# Patient Record
Sex: Female | Born: 1945 | Race: Black or African American | Hispanic: No | Smoking: Never smoker
Health system: Southern US, Community
[De-identification: ages and names within clinical notes are randomized; demographics above are authoritative.]

## PROBLEM LIST (undated history)

## (undated) DIAGNOSIS — N183 Chronic kidney disease, stage 3 unspecified: Secondary | ICD-10-CM

## (undated) DIAGNOSIS — Z9989 Dependence on other enabling machines and devices: Secondary | ICD-10-CM

## (undated) DIAGNOSIS — Z9289 Personal history of other medical treatment: Secondary | ICD-10-CM

## (undated) DIAGNOSIS — Z8719 Personal history of other diseases of the digestive system: Secondary | ICD-10-CM

## (undated) DIAGNOSIS — D638 Anemia in other chronic diseases classified elsewhere: Secondary | ICD-10-CM

## (undated) DIAGNOSIS — F419 Anxiety disorder, unspecified: Secondary | ICD-10-CM

## (undated) DIAGNOSIS — M109 Gout, unspecified: Secondary | ICD-10-CM

## (undated) DIAGNOSIS — R011 Cardiac murmur, unspecified: Secondary | ICD-10-CM

## (undated) DIAGNOSIS — I82409 Acute embolism and thrombosis of unspecified deep veins of unspecified lower extremity: Secondary | ICD-10-CM

## (undated) DIAGNOSIS — G4733 Obstructive sleep apnea (adult) (pediatric): Secondary | ICD-10-CM

## (undated) DIAGNOSIS — E119 Type 2 diabetes mellitus without complications: Secondary | ICD-10-CM

## (undated) DIAGNOSIS — M1990 Unspecified osteoarthritis, unspecified site: Secondary | ICD-10-CM

## (undated) DIAGNOSIS — I4891 Unspecified atrial fibrillation: Secondary | ICD-10-CM

## (undated) DIAGNOSIS — E0590 Thyrotoxicosis, unspecified without thyrotoxic crisis or storm: Secondary | ICD-10-CM

## (undated) DIAGNOSIS — H409 Unspecified glaucoma: Secondary | ICD-10-CM

## (undated) DIAGNOSIS — I10 Essential (primary) hypertension: Secondary | ICD-10-CM

## (undated) DIAGNOSIS — E785 Hyperlipidemia, unspecified: Secondary | ICD-10-CM

## (undated) HISTORY — PX: TRACHEOSTOMY CLOSURE: SHX458

## (undated) HISTORY — PX: CARDIAC CATHETERIZATION: SHX172

## (undated) HISTORY — PX: APPENDECTOMY: SHX54

## (undated) HISTORY — PX: CARPAL TUNNEL RELEASE: SHX101

## (undated) HISTORY — PX: VAGINAL HYSTERECTOMY: SUR661

## (undated) HISTORY — PX: CHOLECYSTECTOMY: SHX55

---

## 2013-09-11 HISTORY — PX: EXPLORATORY LAPAROTOMY: SUR591

## 2013-10-12 HISTORY — PX: GASTROSTOMY TUBE PLACEMENT: SHX655

## 2013-10-12 HISTORY — PX: TRACHEOSTOMY: SUR1362

## 2013-10-25 ENCOUNTER — Inpatient Hospital Stay
Admission: AD | Admit: 2013-10-25 | Discharge: 2013-11-22 | Disposition: A | Discharge status: Rehabilitation Facility | Payer: Medicare Other | Source: Ambulatory Visit | Attending: Internal Medicine | Admitting: Internal Medicine

## 2013-10-25 DIAGNOSIS — J9620 Acute and chronic respiratory failure, unspecified whether with hypoxia or hypercapnia: Secondary | ICD-10-CM | POA: Diagnosis present

## 2013-10-25 DIAGNOSIS — E119 Type 2 diabetes mellitus without complications: Secondary | ICD-10-CM

## 2013-10-25 DIAGNOSIS — N189 Chronic kidney disease, unspecified: Secondary | ICD-10-CM

## 2013-10-25 DIAGNOSIS — G928 Other toxic encephalopathy: Secondary | ICD-10-CM

## 2013-10-25 DIAGNOSIS — E8770 Fluid overload, unspecified: Secondary | ICD-10-CM | POA: Diagnosis present

## 2013-10-25 DIAGNOSIS — Z9911 Dependence on respirator [ventilator] status: Secondary | ICD-10-CM

## 2013-10-25 DIAGNOSIS — N289 Disorder of kidney and ureter, unspecified: Secondary | ICD-10-CM

## 2013-10-25 DIAGNOSIS — Z930 Tracheostomy status: Secondary | ICD-10-CM

## 2013-10-25 DIAGNOSIS — G92 Toxic encephalopathy: Secondary | ICD-10-CM

## 2013-10-25 HISTORY — DX: Thyrotoxicosis, unspecified without thyrotoxic crisis or storm: E05.90

## 2013-10-25 HISTORY — DX: Chronic kidney disease, stage 3 unspecified: N18.30

## 2013-10-25 HISTORY — DX: Morbid (severe) obesity due to excess calories: E66.01

## 2013-10-25 HISTORY — DX: Anemia in other chronic diseases classified elsewhere: D63.8

## 2013-10-25 HISTORY — DX: Essential (primary) hypertension: I10

## 2013-10-25 HISTORY — DX: Acute embolism and thrombosis of unspecified deep veins of unspecified lower extremity: I82.409

## 2013-10-25 HISTORY — DX: Chronic kidney disease, stage 3 (moderate): N18.3

## 2013-10-25 HISTORY — DX: Type 2 diabetes mellitus without complications: E11.9

## 2013-10-25 HISTORY — DX: Hyperlipidemia, unspecified: E78.5

## 2013-10-25 LAB — BLOOD GAS, ARTERIAL
Acid-base deficit: 4.3 mmol/L — ABNORMAL HIGH (ref 0.0–2.0)
BICARBONATE: 19 meq/L — AB (ref 20.0–24.0)
FIO2: 0.4 %
MECHVT: 650 mL
O2 SAT: 96.1 %
PEEP/CPAP: 5 cmH2O
PO2 ART: 80.7 mmHg (ref 80.0–100.0)
Patient temperature: 98.6
RATE: 16 resp/min
TCO2: 19.9 mmol/L (ref 0–100)
pCO2 arterial: 27.7 mmHg — ABNORMAL LOW (ref 35.0–45.0)
pH, Arterial: 7.451 — ABNORMAL HIGH (ref 7.350–7.450)

## 2013-10-26 ENCOUNTER — Other Ambulatory Visit (HOSPITAL_COMMUNITY): Payer: Medicare Other

## 2013-10-26 ENCOUNTER — Encounter: Payer: Self-pay | Admitting: Adult Health

## 2013-10-26 DIAGNOSIS — E8770 Fluid overload, unspecified: Secondary | ICD-10-CM | POA: Diagnosis present

## 2013-10-26 DIAGNOSIS — Z9911 Dependence on respirator [ventilator] status: Secondary | ICD-10-CM

## 2013-10-26 DIAGNOSIS — G929 Unspecified toxic encephalopathy: Secondary | ICD-10-CM

## 2013-10-26 DIAGNOSIS — G928 Other toxic encephalopathy: Secondary | ICD-10-CM | POA: Diagnosis present

## 2013-10-26 DIAGNOSIS — G92 Toxic encephalopathy: Secondary | ICD-10-CM | POA: Diagnosis present

## 2013-10-26 DIAGNOSIS — N289 Disorder of kidney and ureter, unspecified: Secondary | ICD-10-CM

## 2013-10-26 DIAGNOSIS — J9620 Acute and chronic respiratory failure, unspecified whether with hypoxia or hypercapnia: Secondary | ICD-10-CM

## 2013-10-26 DIAGNOSIS — Z930 Tracheostomy status: Secondary | ICD-10-CM

## 2013-10-26 DIAGNOSIS — E8779 Other fluid overload: Secondary | ICD-10-CM

## 2013-10-26 DIAGNOSIS — N189 Chronic kidney disease, unspecified: Secondary | ICD-10-CM | POA: Diagnosis present

## 2013-10-26 DIAGNOSIS — E119 Type 2 diabetes mellitus without complications: Secondary | ICD-10-CM | POA: Diagnosis present

## 2013-10-26 LAB — COMPREHENSIVE METABOLIC PANEL
ALK PHOS: 85 U/L (ref 39–117)
ALT: 21 U/L (ref 0–35)
AST: 19 U/L (ref 0–37)
Albumin: 1.4 g/dL — ABNORMAL LOW (ref 3.5–5.2)
BUN: 54 mg/dL — AB (ref 6–23)
CALCIUM: 8.1 mg/dL — AB (ref 8.4–10.5)
CO2: 18 mEq/L — ABNORMAL LOW (ref 19–32)
Chloride: 104 mEq/L (ref 96–112)
Creatinine, Ser: 1.97 mg/dL — ABNORMAL HIGH (ref 0.50–1.10)
GFR calc non Af Amer: 25 mL/min — ABNORMAL LOW (ref >90)
GFR, EST AFRICAN AMERICAN: 30 mL/min — AB (ref >90)
Glucose, Bld: 178 mg/dL — ABNORMAL HIGH (ref 70–99)
Potassium: 4 mEq/L (ref 3.7–5.3)
Sodium: 142 mEq/L (ref 137–147)
TOTAL PROTEIN: 5.6 g/dL — AB (ref 6.0–8.3)
Total Bilirubin: 0.4 mg/dL (ref 0.3–1.2)

## 2013-10-26 LAB — CBC
HEMATOCRIT: 29.2 % — AB (ref 36.0–46.0)
Hemoglobin: 9.6 g/dL — ABNORMAL LOW (ref 12.0–15.0)
MCH: 28.3 pg (ref 26.0–34.0)
MCHC: 32.9 g/dL (ref 30.0–36.0)
MCV: 86.1 fL (ref 78.0–100.0)
Platelets: 150 10*3/uL (ref 150–400)
RBC: 3.39 MIL/uL — ABNORMAL LOW (ref 3.87–5.11)
RDW: 17.4 % — ABNORMAL HIGH (ref 11.5–15.5)
WBC: 4.4 10*3/uL (ref 4.0–10.5)

## 2013-10-26 LAB — BLOOD GAS, ARTERIAL
ACID-BASE DEFICIT: 4.9 mmol/L — AB (ref 0.0–2.0)
BICARBONATE: 18.1 meq/L — AB (ref 20.0–24.0)
FIO2: 0.4 %
LHR: 12 {breaths}/min
MECHVT: 650 mL
O2 SAT: 98 %
PEEP: 5 cmH2O
PO2 ART: 94.2 mmHg (ref 80.0–100.0)
Patient temperature: 98.6
TCO2: 18.8 mmol/L (ref 0–100)
pCO2 arterial: 24.9 mmHg — ABNORMAL LOW (ref 35.0–45.0)
pH, Arterial: 7.473 — ABNORMAL HIGH (ref 7.350–7.450)

## 2013-10-26 LAB — PREALBUMIN: Prealbumin: 16.8 mg/dL — ABNORMAL LOW (ref 17.0–34.0)

## 2013-10-26 LAB — PROCALCITONIN: Procalcitonin: 1.03 ng/mL

## 2013-10-26 LAB — TSH: TSH: 2.23 u[IU]/mL (ref 0.350–4.500)

## 2013-10-26 NOTE — Consult Note (Signed)
Name: Adeena Apgar MRN: [id number] DOB: 1946-01-31    ADMISSION DATE:  10/25/2013 CONSULTATION DATE:  10/26/13  REFERRING MD :  Sharyon Medicus  PRIMARY SERVICE:  [facility]   CHIEF COMPLAINT:  Vent mgmt   BRIEF PATIENT DESCRIPTION: 67yo morbidly obese female with hx PAF, HTN, hx DVT on coumadin, initially admitted 3/18 to [facility] following complications of attempted ileum polyp removal.  Ultimately developed sigmoid colon perforation s/p exp lap 3/18 where she failed extubation post op.  Course was c/b acute on chronic renal failure, Afib with RVR, ?angioedema r/t Invanz, fungemia, dehiscence of her abd wound and VDRF now s/p trach.  Tx to [facility] 4/14 for further vent weaning, wound care and rehab efforts.   SIGNIFICANT EVENTS / STUDIES:    LINES / TUBES: Trach (osh) 4/1>>> R PICC (osh) 4/10>>> PEG (osh) 4/7>>>  CULTURES:   ANTIBIOTICS: Aztreonam >>> Micafungin >>>  HISTORY OF PRESENT ILLNESS:  67yo morbidly obese female with hx PAF, HTN, hx DVT on coumadin, initially admitted 3/18 to [facility] following complications of attempted ileum polyp removal.  Ultimately developed sigmoid colon perforation s/p exp lap 3/18 where she failed extubation post op.  Course was c/b acute on chronic renal failure, Afib with RVR, ?angioedema r/t Invanz, fungemia, dehiscence of her abd wound and VDRF now s/p trach.  Tx to [facility] 4/14 for further vent weaning, wound care and rehab efforts.   PAST MEDICAL HISTORY :  Past Medical History  Diagnosis Date  . PAF (paroxysmal atrial fibrillation)   . Anemia of chronic disease   . HTN (hypertension)   . Morbid obesity   . Diabetes mellitus, type II   . Hyperthyroidism   . Hyperlipidemia   . CKD (chronic kidney disease), stage III    No past surgical history on file. Prior to Admission medications   Not on File   Allergies not on file  FAMILY HISTORY:  No family history on file. SOCIAL HISTORY:  has no tobacco, alcohol, and  drug history on file.  REVIEW OF SYSTEMS:   Pt sedated on vent.  As per HPI obtained from records.    VITAL SIGNS:  Reviewed at bedside.   PHYSICAL EXAMINATION: General:  Morbidly obese female, NAD on vent  Neuro:  Awake, attempts to interact, RASS 0, MAE HEENT:  Trach #8, midline, c/d Cardiovascular:  s1s2 distant Lungs:  resps even non labored on full vent support, clear anteriorly, diminished bases  Abdomen:  Obese, PEG c/d, mid-L abd dehiscence with dsg c/d  Musculoskeletal:  Warm and dry, 1-2+ BLE edema   Recent Labs Lab 10/26/13 0500  NA 142  K 4.0  CL 104  CO2 18*  BUN 54*  CREATININE 1.97*  GLUCOSE 178*    Recent Labs Lab 10/26/13 0500  HGB 9.6*  HCT 29.2*  WBC 4.4  PLT 150   Dg Chest Port 1 View  10/26/2013   CLINICAL DATA:  Respiratory failure  EXAM: PORTABLE CHEST - 1 VIEW  COMPARISON:  Portable exam 0701 hr without priors for comparison  FINDINGS: Tracheostomy tube projects over tracheal air column the tip 4.7 cm above carinal.  Tip of right arm PICC line projects over SVC.  Normal heart size and mediastinal contours.  Scattered mild bilateral pulmonary infiltrates.  No pleural effusion or pneumothorax.  IMPRESSION: Scattered bilateral pulmonary infiltrates.   Electronically Signed   By: Ulyses Southward M.D.   On: 10/26/2013 07:54    ASSESSMENT / PLAN:  Prolonged VDRF after  complicated critical illness- s/p exp lap c/b abd wound dehiscence in setting perforated sigmoid colon.   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 I have formulated the assessment and plan as reflected in the note above with amendments made by me.   Discussed with Dr Duwayne HeckHijazi  David Simonds, MD;  PCCM service; Mobile [phone]

## 2013-10-27 DIAGNOSIS — Z9911 Dependence on respirator [ventilator] status: Secondary | ICD-10-CM

## 2013-10-27 NOTE — Progress Notes (Signed)
Name: Sarah Tran MRN: [id number] DOB: 25-Nov-1945    ADMISSION DATE:  10/25/2013 CONSULTATION DATE:  10/26/13  REFERRING MD :  Sharyon Medicus  PRIMARY SERVICE:  [facility]   CHIEF COMPLAINT:  Vent mgmt   BRIEF PATIENT DESCRIPTION: 68yo morbidly obese female with hx PAF, HTN, hx DVT on coumadin, initially admitted 3/18 to [facility] following complications of attempted ileum polyp removal.  Ultimately developed sigmoid colon perforation s/p exp lap 3/18 where she failed extubation post op.  Course was c/b acute on chronic renal failure, Afib with RVR, ?angioedema r/t Invanz, fungemia, dehiscence of her abd wound and VDRF now s/p trach.  Tx to [facility] 4/14 for further vent weaning, wound care and rehab efforts.   SIGNIFICANT EVENTS / STUDIES:  4/15 started on [facility]  protocol  LINES / TUBES: Trach (osh) 4/1>>> R PICC (osh) 4/10>>> PEG (osh) 4/7>>>  CULTURES:   ANTIBIOTICS: Per [facility]  HISTORY OF PRESENT ILLNESS:  68yo morbidly obese female with hx PAF, HTN, hx DVT on coumadin, initially admitted 3/18 to [facility] following complications of attempted ileum polyp removal.  Ultimately developed sigmoid colon perforation s/p exp lap 3/18 where she failed extubation post op.  Course was c/b acute on chronic renal failure, Afib with RVR, ?angioedema r/t Invanz, fungemia, dehiscence of her abd wound and VDRF now s/p trach.  Tx to [facility] 4/14 for further vent weaning, wound care and rehab efforts.   VITAL SIGNS: Vital signs reviewed. Abnormal values will appear under impression plan section.    PHYSICAL EXAMINATION: General:  Morbidly obese female, NAD on vent  Neuro:  Awake, follows commands now that diprivan is off HEENT:  Trach #8, midline, c/d Cardiovascular:  s1s2 distant Lungs:  resps even non labored on full vent support, clear anteriorly, diminished bases  Abdomen:  Obese, PEG c/d, mid-L abd dehiscence with dsg c/d  Musculoskeletal:  Warm and dry, 1-2+ BLE  edema   Recent Labs Lab 10/26/13 0500  NA 142  K 4.0  CL 104  CO2 18*  BUN 54*  CREATININE 1.97*  GLUCOSE 178*    Recent Labs Lab 10/26/13 0500  HGB 9.6*  HCT 29.2*  WBC 4.4  PLT 150   Dg Chest Port 1 View  10/26/2013   CLINICAL DATA:  Respiratory failure  EXAM: PORTABLE CHEST - 1 VIEW  COMPARISON:  Portable exam 0701 hr without priors for comparison  FINDINGS: Tracheostomy tube projects over tracheal air column the tip 4.7 cm above carinal.  Tip of right arm PICC line projects over SVC.  Normal heart size and mediastinal contours.  Scattered mild bilateral pulmonary infiltrates.  No pleural effusion or pneumothorax.  IMPRESSION: Scattered bilateral pulmonary infiltrates.   Electronically Signed   By: Ulyses Southward M.D.   On: 10/26/2013 07:54    ASSESSMENT / PLAN:  Prolonged VDRF after complicated critical illness- s/p exp lap c/b abd wound dehiscence in setting perforated sigmoid colon.   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  After 5:30 PM or weekends, call [phone]

## 2013-10-27 NOTE — Progress Notes (Signed)
[facility]                                                                                              Progress note     Patient Demographics  Sarah Tran, is a 68 y.o. female  JKQ:[id number]  [id number]  DOB - 1945/09/19  Admit date - 10/25/2013  Admitting Physician Carron Curie, MD  Outpatient Primary MD for the patient is No PCP Per Patient  LOS - 2  Chief complaint   Respiratory failure      Wound   Bacteremia      Subjective:   Sarah Tran is obtunded and can't give any history  Objective:   Vital signs  Temperature 98.3 Heart rate 94 Respiratory rate 16 Blood pressure 119/63 Pulse ox 96%    Exam Obtunded cannot give any history Right gaze preference, pupils equal reactive to light Supple Neck,No JVD, No cervical lymphadenopathy appriciated.  Symmetrical Chest wall movement, decreased breath sounds bilaterally with scattered rhonchi Irregular,No Gallops,Rubs or new Murmurs, No Parasternal Heave +ve B.Sounds, Abd Soft, abdomen wound noted with Dehiscence, ostomy noted ,  No Cyanosis, Clubbing or edema, No new Rash or bruise     I&Os 2446/1300 PEG tube noted Foley - yes Trach yes  Data Review   CBC  Recent Labs Lab 10/26/13 0500  WBC 4.4  HGB 9.6*  HCT 29.2*  PLT 150  MCV 86.1  MCH 28.3  MCHC 32.9  RDW 17.4*    Chemistries   Recent Labs Lab 10/26/13 0500  NA 142  K 4.0  CL 104  CO2 18*  GLUCOSE 178*  BUN 54*  CREATININE 1.97*  CALCIUM 8.1*  AST 19  ALT 21  ALKPHOS 85  BILITOT 0.4    Recent Labs  10/26/13 0500  TSH 2.230   ------------------------------------------------------------------------------------------------------------------ No results found for this basename: VITAMINB12, FOLATE, FERRITIN, TIBC, IRON, RETICCTPCT,  in the last 72 hours  Coagulation profile No results found for this basename: INR, PROTIME,  in the last  168 hours  No results found for this basename: DDIMER,  in the last 72 hours  Micro Results No results found for this or any previous visit (from the past 240 hour(s)).     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 Candidemia likely line sepsis related on Diflucan Paroxysmal atrial fib Escherichia coli UTI Hypertension controlled Protein calorie malnutrition on Vital 1.5 Encephalopathy/agitation of propofol on when necessary fentanyl and Klonopin scheduled Diabetes mellitus type 2 Generalized weakness continue with PT OT Sigmoid colon perforation status post diverting colostomy Bacteremia with gram-positive cocci in clusters; started on vancomycin yesterday Plan Check CBC BMP portable chest x-ray in a.m. Check repeat blood cultures Check echo  Critical-care time 38 minutes  Code Status: Full  Family Communication: Discussed with family at bedside  DVT Prophylaxis heparin   Carron Curie M.D on 10/27/2013 at 2:20 PM

## 2013-10-28 ENCOUNTER — Other Ambulatory Visit (HOSPITAL_COMMUNITY): Payer: Self-pay

## 2013-10-28 ENCOUNTER — Other Ambulatory Visit (HOSPITAL_COMMUNITY): Payer: Medicare Other

## 2013-10-28 DIAGNOSIS — I517 Cardiomegaly: Secondary | ICD-10-CM

## 2013-10-28 LAB — BASIC METABOLIC PANEL
BUN: 60 mg/dL — AB (ref 6–23)
CHLORIDE: 105 meq/L (ref 96–112)
CO2: 20 mEq/L (ref 19–32)
Calcium: 8.2 mg/dL — ABNORMAL LOW (ref 8.4–10.5)
Creatinine, Ser: 2.25 mg/dL — ABNORMAL HIGH (ref 0.50–1.10)
GFR calc non Af Amer: 22 mL/min — ABNORMAL LOW (ref >90)
GFR, EST AFRICAN AMERICAN: 25 mL/min — AB (ref >90)
Glucose, Bld: 213 mg/dL — ABNORMAL HIGH (ref 70–99)
POTASSIUM: 4 meq/L (ref 3.7–5.3)
SODIUM: 140 meq/L (ref 137–147)

## 2013-10-28 LAB — CBC
HEMATOCRIT: 27.3 % — AB (ref 36.0–46.0)
HEMOGLOBIN: 9 g/dL — AB (ref 12.0–15.0)
MCH: 29.2 pg (ref 26.0–34.0)
MCHC: 33 g/dL (ref 30.0–36.0)
MCV: 88.6 fL (ref 78.0–100.0)
Platelets: 182 10*3/uL (ref 150–400)
RBC: 3.08 MIL/uL — AB (ref 3.87–5.11)
RDW: 16.2 % — ABNORMAL HIGH (ref 11.5–15.5)
WBC: 4.7 10*3/uL (ref 4.0–10.5)

## 2013-10-28 NOTE — Progress Notes (Signed)
[facility]                                                                                              Progress note     Patient Demographics  Sarah Tran, is a 68 y.o. female  NAT:[id number]  [id number]:[id number]  DOB - 1945-12-29  Admit date - 10/25/2013  Admitting Physician Carron Curie, MD  Outpatient Primary MD for the patient is No PCP Per Patient  LOS - 3  Chief complaint   Respiratory failure      Wound   Bacteremia      Subjective:   Sarah Tran is obtunded and can't give any history  Objective:   Vital signs  Temperature 99.1 Heart rate 103 Respiratory rate 20 Blood pressure 149/85 Pulse ox 99%    Exam Obtunded cannot give any history Right gaze preference, pupils equal reactive to light Supple Neck,No JVD, No cervical lymphadenopathy appriciated.  Symmetrical Chest wall movement, decreased breath sounds bilaterally with scattered rhonchi Irregular,No Gallops,Rubs or new Murmurs, No Parasternal Heave +ve B.Sounds, Abd Soft, abdomen wound noted with Dehiscence, ostomy noted ,  No Cyanosis, Clubbing or edema, No new Rash or bruise     I&Os 2100/2600 PEG tube noted Foley - yes Trach yes  Data Review   CBC  Recent Labs Lab 10/26/13 0500 10/28/13 0500  WBC 4.4 4.7  HGB 9.6* 9.0*  HCT 29.2* 27.3*  PLT 150 182  MCV 86.1 88.6  MCH 28.3 29.2  MCHC 32.9 33.0  RDW 17.4* 16.2*    Chemistries   Recent Labs Lab 10/26/13 0500 10/28/13 0500  NA 142 140  K 4.0 4.0  CL 104 105  CO2 18* 20  GLUCOSE 178* 213*  BUN 54* 60*  CREATININE 1.97* 2.25*  CALCIUM 8.1* 8.2*  AST 19  --   ALT 21  --   ALKPHOS 85  --   BILITOT 0.4  --     Recent Labs  10/26/13 0500  TSH 2.230   ------------------------------------------------------------------------------------------------------------------ No results found for this basename: VITAMINB12, FOLATE, FERRITIN, TIBC,  IRON, RETICCTPCT,  in the last 72 hours  Coagulation profile No results found for this basename: INR, PROTIME,  in the last 168 hours  No results found for this basename: DDIMER,  in the last 72 hours  Micro Results No results found for this or any previous visit (from the past 240 hour(s)).     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 Candidemia likely line sepsis related on Diflucan Paroxysmal atrial fib Escherichia coli UTI Hypertension controlled Protein calorie malnutrition on Vital 1.5 Encephalopathy/agitation of propofol on when necessary fentanyl and Klonopin scheduled Diabetes mellitus type 2 Generalized weakness continue with PT OT Sigmoid colon perforation status post diverting colostomy Bacteremia with gram-positive cocci in clusters; started on vancomycin yesterday Plan Check BMP in a.m. Check repeat blood cultures Check echo If creatinine continues: Up with change vancomycin to Zyvox IV fluids normal saline Check urinalysis for hematuria  Critical-care time 34 minutes  Code Status: Full  Family Communication: Discussed with family at bedside  DVT Prophylaxis heparin   Carron CurieAli Londyn Wotton M.D on 10/28/2013 at 3:57 PM

## 2013-10-28 NOTE — Progress Notes (Signed)
  Echocardiogram 2D Echocardiogram has been performed.  Sarah Tran 10/28/2013, 12:52 PM

## 2013-10-29 LAB — BASIC METABOLIC PANEL
BUN: 61 mg/dL — ABNORMAL HIGH (ref 6–23)
CHLORIDE: 105 meq/L (ref 96–112)
CO2: 23 meq/L (ref 19–32)
CREATININE: 2.04 mg/dL — AB (ref 0.50–1.10)
Calcium: 8.3 mg/dL — ABNORMAL LOW (ref 8.4–10.5)
GFR calc non Af Amer: 24 mL/min — ABNORMAL LOW (ref >90)
GFR, EST AFRICAN AMERICAN: 28 mL/min — AB (ref >90)
Glucose, Bld: 203 mg/dL — ABNORMAL HIGH (ref 70–99)
POTASSIUM: 3.9 meq/L (ref 3.7–5.3)
SODIUM: 140 meq/L (ref 137–147)

## 2013-10-29 NOTE — Progress Notes (Signed)
[facility]                                                                                              Progress note     Patient Demographics  Sarah Tran, is a 68 y.o. female  WGN:[id number]  HQI:[id number]  DOB - 1945/10/11  Admit date - 10/25/2013  Admitting Physician Carron CurieAli Rebeccah Ivins, MD  Outpatient Primary MD for the patient is No PCP Per Patient  LOS - 4  Chief complaint   Respiratory failure      Wound   Bacteremia      Subjective:   Sarah CabalNancy Dorsi mode alert and awake complains only of constipation. No chest pains shortness of breath no nausea or vomiting  Objective:   Vital signs  Temperature 96.8 Heart rate 71 Respiratory rate 15 Blood pressure 140/80 5 Pulse ox 98%    Exam Obtunded cannot give any history Right gaze preference, pupils equal reactive to light Supple Neck,No JVD, No cervical lymphadenopathy appriciated.  Symmetrical Chest wall movement, decreased breath sounds bilaterally with scattered rhonchi Irregular,No Gallops,Rubs or new Murmurs, No Parasternal Heave +ve B.Sounds, Abd Soft, abdomen wound noted with Dehiscence, ostomy noted ,  No Cyanosis, Clubbing or edema, No new Rash or bruise     I&Os unknown PEG tube noted Foley - yes Trach yes  Data Review   CBC  Recent Labs Lab 10/26/13 0500 10/28/13 0500  WBC 4.4 4.7  HGB 9.6* 9.0*  HCT 29.2* 27.3*  PLT 150 182  MCV 86.1 88.6  MCH 28.3 29.2  MCHC 32.9 33.0  RDW 17.4* 16.2*    Chemistries   Recent Labs Lab 10/26/13 0500 10/28/13 0500 10/29/13 0500  NA 142 140 140  K 4.0 4.0 3.9  CL 104 105 105  CO2 18* 20 23  GLUCOSE 178* 213* 203*  BUN 54* 60* 61*  CREATININE 1.97* 2.25* 2.04*  CALCIUM 8.1* 8.2* 8.3*  AST 19  --   --   ALT 21  --   --   ALKPHOS 85  --   --   BILITOT 0.4  --   --    No results found for this basename: TSH, T4TOTAL, FREET3, T3FREE, THYROIDAB,  in the last 72  hours ------------------------------------------------------------------------------------------------------------------ No results found for this basename: VITAMINB12, FOLATE, FERRITIN, TIBC, IRON, RETICCTPCT,  in the last 72 hours  Coagulation profile No results found for this basename: INR, PROTIME,  in the last 168 hours  No results found for this basename: DDIMER,  in the last 72 hours  Micro Results No results found for this or any previous visit (from the past 240 hour(s)).     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 Candidemia likely line sepsis related on Diflucan Paroxysmal atrial fib Escherichia coli UTI Hypertension controlled Protein calorie malnutrition on Vital 1.5 Encephalopathy/agitation of propofol on when necessary fentanyl and Klonopin scheduled Diabetes mellitus type 2 Generalized weakness continue with PT OT Sigmoid colon perforation status post  diverting colostomy Bacteremia with gram-positive cocci in clusters; started on vancomycin yesterday Constipation  Plan  Start docusate and lactulose   Code Status: Full  Family Communication: Discussed with family at bedside  DVT Prophylaxis heparin   Carron Curie M.D on 10/29/2013 at 12:51 PM

## 2013-10-30 LAB — VANCOMYCIN, TROUGH: Vancomycin Tr: 21.7 ug/mL — ABNORMAL HIGH (ref 10.0–20.0)

## 2013-10-30 NOTE — Progress Notes (Signed)
[facility]                                                                                              Progress note     Patient Demographics  Sarah Tran, is a 68 y.o. female  [id number]:[id number]  [id number]:[id number]  DOB - [birth year]  Admit date - 10/25/2013  Admitting Physician Carron Curie, MD  Outpatient Primary MD for the patient is No PCP Per Patient  LOS - 5  Chief complaint   Respiratory failure      Wound   Bacteremia      Subjective:   Sarah Tran denies any chest pain shortness of breath nausea or vomiting. Her diarrhea has resolved. Objective:   Vital signs  Temperature 98.4 Heart rate 82 Respiratory rate 18 Blood pressure 156/76 Pulse ox 97%    Exam Obtunded cannot give any history Right gaze preference, pupils equal reactive to light Supple Neck,No JVD, No cervical lymphadenopathy appriciated.  Symmetrical Chest wall movement, decreased breath sounds bilaterally with scattered rhonchi Irregular,No Gallops,Rubs or new Murmurs, No Parasternal Heave +ve B.Sounds, Abd Soft, abdomen wound noted with Dehiscence, ostomy noted ,  No Cyanosis, Clubbing or edema, No new Rash or bruise     I&Os 2573/2850 PEG tube noted Foley - yes Trach yes  Data Review   CBC  Recent Labs Lab 10/26/13 0500 10/28/13 0500  WBC 4.4 4.7  HGB 9.6* 9.0*  HCT 29.2* 27.3*  PLT 150 182  MCV 86.1 88.6  MCH 28.3 29.2  MCHC 32.9 33.0  RDW 17.4* 16.2*    Chemistries   Recent Labs Lab 10/26/13 0500 10/28/13 0500 10/29/13 0500  NA 142 140 140  K 4.0 4.0 3.9  CL 104 105 105  CO2 18* 20 23  GLUCOSE 178* 213* 203*  BUN 54* 60* 61*  CREATININE 1.97* 2.25* 2.04*  CALCIUM 8.1* 8.2* 8.3*  AST 19  --   --   ALT 21  --   --   ALKPHOS 85  --   --   BILITOT 0.4  --   --    No results found for this basename: TSH, T4TOTAL, FREET3, T3FREE, THYROIDAB,  in the last 72  hours ------------------------------------------------------------------------------------------------------------------ No results found for this basename: VITAMINB12, FOLATE, FERRITIN, TIBC, IRON, RETICCTPCT,  in the last 72 hours  Coagulation profile No results found for this basename: INR, PROTIME,  in the last 168 hours  No results found for this basename: DDIMER,  in the last 72 hours  Micro Results No results found for this or any previous visit (from the past 240 hour(s)).     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 Candidemia likely line sepsis related on Diflucan Paroxysmal atrial fib Escherichia coli UTI Hypertension controlled Protein calorie malnutrition on Vital 1.5 Encephalopathy/agitation of propofol on when necessary fentanyl and Klonopin scheduled Diabetes mellitus type 2 Generalized weakness continue with PT OT Sigmoid colon perforation status post diverting colostomy Bacteremia with gram-positive cocci  in clusters; started on vancomycin yesterday Constipation  Plan Check labs and portable chest x-ray in am    Code Status: Full  Family Communication: Discussed with family at bedside  DVT Prophylaxis heparin   Carron CurieAli Malai Lady M.D on 10/30/2013 at 12:49 PM

## 2013-10-31 ENCOUNTER — Other Ambulatory Visit (HOSPITAL_COMMUNITY): Payer: Medicare Other

## 2013-10-31 LAB — CBC
HEMATOCRIT: 27 % — AB (ref 36.0–46.0)
HEMOGLOBIN: 8.9 g/dL — AB (ref 12.0–15.0)
MCH: 28.9 pg (ref 26.0–34.0)
MCHC: 33 g/dL (ref 30.0–36.0)
MCV: 87.7 fL (ref 78.0–100.0)
Platelets: 176 10*3/uL (ref 150–400)
RBC: 3.08 MIL/uL — AB (ref 3.87–5.11)
RDW: 15.1 % (ref 11.5–15.5)
WBC: 5.9 10*3/uL (ref 4.0–10.5)

## 2013-10-31 LAB — BASIC METABOLIC PANEL
BUN: 63 mg/dL — AB (ref 6–23)
CO2: 26 meq/L (ref 19–32)
CREATININE: 1.87 mg/dL — AB (ref 0.50–1.10)
Calcium: 8.7 mg/dL (ref 8.4–10.5)
Chloride: 102 mEq/L (ref 96–112)
GFR calc Af Amer: 31 mL/min — ABNORMAL LOW (ref >90)
GFR calc non Af Amer: 27 mL/min — ABNORMAL LOW (ref >90)
GLUCOSE: 261 mg/dL — AB (ref 70–99)
Potassium: 3.6 mEq/L — ABNORMAL LOW (ref 3.7–5.3)
Sodium: 141 mEq/L (ref 137–147)

## 2013-10-31 NOTE — Progress Notes (Signed)
[facility]                                                                                              Progress note     Patient Demographics  Sarah Tran, is a 68 y.o. female  WOE:[id number]  [id number]  DOB - 08-25-1945  Admit date - 10/25/2013  Admitting Physician Carron Curie, MD  Outpatient Primary MD for the patient is No PCP Per Patient  LOS - 6  Chief complaint   Respiratory failure      Wound   Bacteremia      Subjective:   Sarah Tran denies any chest pain shortness of breath nausea or vomiting. Her diarrhea has resolved. Objective:   Vital signs  Temperature 98 Heart rate 84 Respiratory rate 16 Blood pressure 132/79 Pulse ox 97%    Exam More alert and awake Right gaze preference, pupils equal reactive to light Supple Neck,No JVD, No cervical lymphadenopathy appriciated.  Symmetrical Chest wall movement, decreased breath sounds bilaterally with scattered rhonchi Irregular,No Gallops,Rubs or new Murmurs, No Parasternal Heave +ve B.Sounds, Abd Soft, abdomen wound noted with Dehiscence, ostomy noted ,  No Cyanosis, Clubbing or edema, No new Rash or bruise     I&Os 20970/4050 PEG tube noted Foley - yes Trach yes  Data Review   CBC  Recent Labs Lab 10/26/13 0500 10/28/13 0500 10/31/13 0555  WBC 4.4 4.7 5.9  HGB 9.6* 9.0* 8.9*  HCT 29.2* 27.3* 27.0*  PLT 150 182 176  MCV 86.1 88.6 87.7  MCH 28.3 29.2 28.9  MCHC 32.9 33.0 33.0  RDW 17.4* 16.2* 15.1    Chemistries   Recent Labs Lab 10/26/13 0500 10/28/13 0500 10/29/13 0500 10/31/13 0555  NA 142 140 140 141  K 4.0 4.0 3.9 3.6*  CL 104 105 105 102  CO2 18* 20 23 26   GLUCOSE 178* 213* 203* 261*  BUN 54* 60* 61* 63*  CREATININE 1.97* 2.25* 2.04* 1.87*  CALCIUM 8.1* 8.2* 8.3* 8.7  AST 19  --   --   --   ALT 21  --   --   --   ALKPHOS 85  --   --   --   BILITOT 0.4  --   --   --        Assessment & Plan   VDRF/trach status continue with PSV weaning Acute on chronic renal failure, improving Anemia of chronic disease and acute blood loss 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 Candidemia likely line sepsis related on Diflucan Paroxysmal atrial fib, heart rate has been stable Escherichia coli UTI Hypertension controlled Protein calorie malnutrition on Vital 1.5 Encephalopathy/agitation of propofol on when necessary fentanyl and Klonopin scheduled Diabetes mellitus type 2 uncontrolled Generalized weakness continue with PT OT Sigmoid colon perforation status post diverting colostomy Bacteremia with gram-positive cocci in clusters; started on vancomycin yesterday Constipation  Plan Start with Ace wraps Glucotrol 5 mg daily   Code Status: Full  Family Communication: Discussed with family at bedside  DVT Prophylaxis heparin   Carron Curie M.D on 10/31/2013 at 6:04 PM
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
## 2013-11-01 NOTE — Progress Notes (Signed)
[facility]                                                                                              Progress note     Patient Demographics  Sarah Tran, is a 68 y.o. female  HTD:[id number]  [id number]  DOB - Aug 19, 1945  Admit date - 10/25/2013  Admitting Physician Carron Curie, MD  Outpatient Primary MD for the patient is No PCP Per Patient  LOS - 7  Chief complaint   Respiratory failure      Wound   Bacteremia      Subjective:   Sarah Tran denies any chest pain shortness of breath nausea or vomiting. Her diarrhea has resolved. Objective:   Vital signs  Temperature 97 Heart rate 9428 Respiratory rate 28 Blood pressure 120/60 Pulse ox 98%    Exam More alert and awake Right gaze preference, pupils equal reactive to light Supple Neck,No JVD, No cervical lymphadenopathy appriciated.  Symmetrical Chest wall movement, decreased breath sounds bilaterally with scattered rhonchi Irregular,No Gallops,Rubs or new Murmurs, No Parasternal Heave +ve B.Sounds, Abd Soft, abdomen wound noted with Dehiscence, ostomy noted ,  No Cyanosis, Clubbing or edema, No new Rash or bruise     I&Os 2660/4100 PEG tube noted Foley - yes Trach yes  Data Review   CBC  Recent Labs Lab 10/26/13 0500 10/28/13 0500 10/31/13 0555  WBC 4.4 4.7 5.9  HGB 9.6* 9.0* 8.9*  HCT 29.2* 27.3* 27.0*  PLT 150 182 176  MCV 86.1 88.6 87.7  MCH 28.3 29.2 28.9  MCHC 32.9 33.0 33.0  RDW 17.4* 16.2* 15.1    Chemistries   Recent Labs Lab 10/26/13 0500 10/28/13 0500 10/29/13 0500 10/31/13 0555  NA 142 140 140 141  K 4.0 4.0 3.9 3.6*  CL 104 105 105 102  CO2 18* 20 23 26   GLUCOSE 178* 213* 203* 261*  BUN 54* 60* 61* 63*  CREATININE 1.97* 2.25* 2.04* 1.87*  CALCIUM 8.1* 8.2* 8.3* 8.7  AST 19  --   --   --   ALT 21  --   --   --   ALKPHOS 85  --   --   --   BILITOT 0.4  --   --   --        Assessment & Plan   VDRF/trach status continue with PSV weaning, 16 hours today Acute on chronic renal failure, improving Anemia of chronic disease and acute blood loss 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 Candidemia likely line sepsis related on Diflucan Paroxysmal atrial fib, heart rate has been stable Escherichia coli UTI Hypertension controlled Protein calorie malnutrition on Vital 1.5 Encephalopathy/agitation of propofol on when necessary fentanyl and Klonopin scheduled Diabetes mellitus type 2 uncontrolled Generalized weakness continue with PT OT Sigmoid colon perforation status post diverting colostomy Bacteremia with gram-positive cocci in clusters; started on vancomycin yesterday Constipation Edema continue with Ace wrapping Plan Increase Glucotrol to 5 mg twice a day  Code Status: Full  Family Communication: Discussed with family at bedside  DVT Prophylaxis heparin   Carron Curie M.D on 11/01/2013 at  2:35 PM

## 2013-11-02 ENCOUNTER — Other Ambulatory Visit (HOSPITAL_COMMUNITY): Payer: Medicare Other

## 2013-11-02 LAB — BASIC METABOLIC PANEL
BUN: 59 mg/dL — ABNORMAL HIGH (ref 6–23)
CALCIUM: 8.8 mg/dL (ref 8.4–10.5)
CO2: 29 meq/L (ref 19–32)
Chloride: 99 mEq/L (ref 96–112)
Creatinine, Ser: 1.63 mg/dL — ABNORMAL HIGH (ref 0.50–1.10)
GFR calc Af Amer: 37 mL/min — ABNORMAL LOW (ref >90)
GFR calc non Af Amer: 32 mL/min — ABNORMAL LOW (ref >90)
GLUCOSE: 262 mg/dL — AB (ref 70–99)
POTASSIUM: 3.6 meq/L — AB (ref 3.7–5.3)
Sodium: 140 mEq/L (ref 137–147)

## 2013-11-02 LAB — CBC
HEMATOCRIT: 24.5 % — AB (ref 36.0–46.0)
HEMOGLOBIN: 8.2 g/dL — AB (ref 12.0–15.0)
MCH: 28.8 pg (ref 26.0–34.0)
MCHC: 33.5 g/dL (ref 30.0–36.0)
MCV: 86 fL (ref 78.0–100.0)
Platelets: 193 10*3/uL (ref 150–400)
RBC: 2.85 MIL/uL — ABNORMAL LOW (ref 3.87–5.11)
RDW: 14.7 % (ref 11.5–15.5)
WBC: 7.1 10*3/uL (ref 4.0–10.5)

## 2013-11-02 NOTE — Progress Notes (Signed)
[facility]                                                                                              Progress note     Patient Demographics  Sarah Tran, is a 68 y.o. female  RCV:[id number]  ZWC:[id number]  DOB - [birth year]  Admit date - 10/25/2013  Admitting Physician Carron Curie, MD  Outpatient Primary MD for the patient is No PCP Per Patient  LOS - 8  Chief complaint   Respiratory failure      Wound   Bacteremia      Subjective:   [name] denies any chest pain shortness of breath nausea or vomiting. Her diarrhea has resolved. Objective:   Vital signs  Temperature 98.2 Heart rate 84 Respiratory rate14 Blood pressure 134/67 Pulse ox 97%    Exam More alert and awake Right gaze preference, pupils equal reactive to light Supple Neck,No JVD, No cervical lymphadenopathy appriciated.  Trach in Place , Wound noted Symmetrical Chest wall movement, decreased breath sounds bilaterally with scattered rhonchi Irregular,No Gallops,Rubs or new Murmurs, No Parasternal Heave +ve B.Sounds, Abd Soft, abdomen wound noted with Dehiscence, ostomy noted ,  No Cyanosis, Clubbing or edema, No new Rash or bruise     I&Os 1652/4625 PEG tube noted Foley - yes Trach yes  Data Review   CBC  Recent Labs Lab 10/28/13 0500 10/31/13 0555 11/02/13 0555  WBC 4.7 5.9 7.1  HGB 9.0* 8.9* 8.2*  HCT 27.3* 27.0* 24.5*  PLT 182 176 193  MCV 88.6 87.7 86.0  MCH 29.2 28.9 28.8  MCHC 33.0 33.0 33.5  RDW 16.2* 15.1 14.7    Chemistries   Recent Labs Lab 10/28/13 0500 10/29/13 0500 10/31/13 0555 11/02/13 0555  NA 140 140 141 140  K 4.0 3.9 3.6* 3.6*  CL 105 105 102 99  CO2 20 23 26 29   GLUCOSE 213* 203* 261* 262*  BUN 60* 61* 63* 59*  CREATININE 2.25* 2.04* 1.87* 1.63*  CALCIUM 8.2* 8.3* 8.7 8.8      Assessment & Plan   VDRF/trach status continue with ATC Acute on chronic  renal failure, improving Anemia of chronic disease and acute blood loss 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 Candidemia likely line sepsis related on Diflucan Paroxysmal atrial fib, heart rate has been stable Escherichia coli UTI Hypertension controlled Protein calorie malnutrition on Vital 1.5 Encephalopathy/agitation of propofol on when necessary fentanyl and Klonopin scheduled Diabetes mellitus type 2 uncontrolled Generalized weakness continue with PT OT Sigmoid colon perforation status post diverting colostomy Bacteremia with gram-positive cocci in clusters; started on vancomycin yesterday Constipation Edema continue with Ace wrapping Tracheal wound noted with cartilage showing Plan Increase Glucotrol to10 mg twice a day Consult ENT for tracheal wound  Code Status: Full  Family Communication: Discussed with family at bedside  DVT Prophylaxis heparin   Carron Curie M.D on 11/02/2013 at 5:08 PM
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
## 2013-11-04 ENCOUNTER — Other Ambulatory Visit (HOSPITAL_COMMUNITY): Payer: Medicare Other

## 2013-11-04 NOTE — Consult Note (Unsigned)
NAME:  Sarah Tran, Sarah Tran                      ACCOUNT NO.:  MEDICAL RECORD NO.:  [id number]  LOCATION:                                 FACILITY:  PHYSICIAN:  Kristine Garbe. Ezzard Standing, M.D. DATE OF BIRTH:  DATE OF CONSULTATION:  11/03/2013 DATE OF DISCHARGE:                                CONSULTATION   REASON FOR CONSULT:  Evaluate trach wound, questionable cartilage exposure.  BRIEF HISTORY:  Sarah Tran is an obese 68 year old female who is status post tracheotomy several weeks ago, is admitted to [facility] for respiratory failure and management of health problems and trach.  On recent examination, she had some exposed tissue in the right side of the trach and questionable cartilage exposure.  I was consulted to evaluate the wound.  On exam, the patient has a very thick obese neck.  She has some exposed soft tissue and granulation tissue on the right side of the trach wound. This is all soft to palpation and appears to be healing satisfactorily. There is really no exposed trach as this tissue was soft and consistent more with granulation tissue and this exposed soft tissue of the neck. Janina Mayo is functioning well.  IMPRESSION:  Trach wound, really no signs of significant infection. Recommend just dressing changes to trach when this should heal satisfactorily with no significant problems.  Janina Mayo is functioning well.          ______________________________ Kristine Garbe. Ezzard Standing, M.D.     CEN/MEDQ  D:  11/03/2013  T:  11/03/2013  Job:  [id number]

## 2013-11-05 LAB — BASIC METABOLIC PANEL
BUN: 62 mg/dL — AB (ref 6–23)
CHLORIDE: 94 meq/L — AB (ref 96–112)
CO2: 31 mEq/L (ref 19–32)
CREATININE: 1.57 mg/dL — AB (ref 0.50–1.10)
Calcium: 9.1 mg/dL (ref 8.4–10.5)
GFR calc Af Amer: 39 mL/min — ABNORMAL LOW (ref >90)
GFR calc non Af Amer: 34 mL/min — ABNORMAL LOW (ref >90)
Glucose, Bld: 308 mg/dL — ABNORMAL HIGH (ref 70–99)
Potassium: 3.6 mEq/L — ABNORMAL LOW (ref 3.7–5.3)
Sodium: 140 mEq/L (ref 137–147)

## 2013-11-05 LAB — CBC
HEMATOCRIT: 26.8 % — AB (ref 36.0–46.0)
HEMOGLOBIN: 8.9 g/dL — AB (ref 12.0–15.0)
MCH: 28.6 pg (ref 26.0–34.0)
MCHC: 33.2 g/dL (ref 30.0–36.0)
MCV: 86.2 fL (ref 78.0–100.0)
Platelets: 138 10*3/uL — ABNORMAL LOW (ref 150–400)
RBC: 3.11 MIL/uL — ABNORMAL LOW (ref 3.87–5.11)
RDW: 14.9 % (ref 11.5–15.5)
WBC: 6.6 10*3/uL (ref 4.0–10.5)

## 2013-11-05 LAB — MAGNESIUM: Magnesium: 1.6 mg/dL (ref 1.5–2.5)

## 2013-11-06 ENCOUNTER — Other Ambulatory Visit (HOSPITAL_COMMUNITY): Payer: Medicare Other

## 2013-11-06 LAB — HEPATIC FUNCTION PANEL
ALT: 20 U/L (ref 0–35)
AST: 13 U/L (ref 0–37)
Albumin: 2.1 g/dL — ABNORMAL LOW (ref 3.5–5.2)
Alkaline Phosphatase: 102 U/L (ref 39–117)
BILIRUBIN TOTAL: 0.5 mg/dL (ref 0.3–1.2)
Bilirubin, Direct: 0.2 mg/dL (ref 0.0–0.3)
Indirect Bilirubin: 0.3 mg/dL (ref 0.3–0.9)
Total Protein: 6.1 g/dL (ref 6.0–8.3)

## 2013-11-06 LAB — URINALYSIS, ROUTINE W REFLEX MICROSCOPIC
BILIRUBIN URINE: NEGATIVE
Glucose, UA: NEGATIVE mg/dL
Hgb urine dipstick: NEGATIVE
KETONES UR: NEGATIVE mg/dL
Leukocytes, UA: NEGATIVE
NITRITE: NEGATIVE
PH: 8 (ref 5.0–8.0)
Protein, ur: 100 mg/dL — AB
Specific Gravity, Urine: 1.013 (ref 1.005–1.030)
Urobilinogen, UA: 1 mg/dL (ref 0.0–1.0)

## 2013-11-06 LAB — CBC WITH DIFFERENTIAL/PLATELET
Basophils Absolute: 0 10*3/uL (ref 0.0–0.1)
Basophils Relative: 0 % (ref 0–1)
EOS ABS: 0 10*3/uL (ref 0.0–0.7)
Eosinophils Relative: 0 % (ref 0–5)
HEMATOCRIT: 28.8 % — AB (ref 36.0–46.0)
Hemoglobin: 9.4 g/dL — ABNORMAL LOW (ref 12.0–15.0)
Lymphocytes Relative: 5 % — ABNORMAL LOW (ref 12–46)
Lymphs Abs: 0.5 10*3/uL — ABNORMAL LOW (ref 0.7–4.0)
MCH: 28.6 pg (ref 26.0–34.0)
MCHC: 32.6 g/dL (ref 30.0–36.0)
MCV: 87.5 fL (ref 78.0–100.0)
Monocytes Absolute: 0.5 10*3/uL (ref 0.1–1.0)
Monocytes Relative: 5 % (ref 3–12)
Neutro Abs: 8.1 10*3/uL — ABNORMAL HIGH (ref 1.7–7.7)
Neutrophils Relative %: 90 % — ABNORMAL HIGH (ref 43–77)
Platelets: 128 10*3/uL — ABNORMAL LOW (ref 150–400)
RBC: 3.29 MIL/uL — AB (ref 3.87–5.11)
RDW: 15.3 % (ref 11.5–15.5)
WBC: 9.1 10*3/uL (ref 4.0–10.5)

## 2013-11-06 LAB — URINE MICROSCOPIC-ADD ON

## 2013-11-06 LAB — PROCALCITONIN

## 2013-11-06 LAB — VANCOMYCIN, TROUGH: Vancomycin Tr: 15.5 ug/mL (ref 10.0–20.0)

## 2013-11-07 DIAGNOSIS — N289 Disorder of kidney and ureter, unspecified: Secondary | ICD-10-CM

## 2013-11-07 DIAGNOSIS — E119 Type 2 diabetes mellitus without complications: Secondary | ICD-10-CM

## 2013-11-07 DIAGNOSIS — N189 Chronic kidney disease, unspecified: Secondary | ICD-10-CM

## 2013-11-07 LAB — BASIC METABOLIC PANEL
BUN: 64 mg/dL — ABNORMAL HIGH (ref 6–23)
CALCIUM: 9.6 mg/dL (ref 8.4–10.5)
CO2: 32 mEq/L (ref 19–32)
Chloride: 95 mEq/L — ABNORMAL LOW (ref 96–112)
Creatinine, Ser: 1.85 mg/dL — ABNORMAL HIGH (ref 0.50–1.10)
GFR, EST AFRICAN AMERICAN: 32 mL/min — AB (ref >90)
GFR, EST NON AFRICAN AMERICAN: 27 mL/min — AB (ref >90)
Glucose, Bld: 44 mg/dL — CL (ref 70–99)
Potassium: 2.8 mEq/L — CL (ref 3.7–5.3)
SODIUM: 143 meq/L (ref 137–147)

## 2013-11-07 LAB — URINE CULTURE
CULTURE: NO GROWTH
Colony Count: NO GROWTH

## 2013-11-07 LAB — LIPASE, BLOOD: LIPASE: 24 U/L (ref 11–59)
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
## 2013-11-07 NOTE — Progress Notes (Signed)
[facility]                                                                                              Progress note     Patient Demographics  Sarah Tran, is a 68 y.o. female  FOY:[id number]  [id number]:[id number]  DOB - [birth year]  Admit date - 10/25/2013  Admitting Physician Carron Curie, MD  Outpatient Primary MD for the patient is No PCP Per Patient  LOS - 13  Chief complaint   Respiratory failure      Wound   Bacteremia      Subjective:   Sarah Tran denies any chest pain shortness of breath nausea or vomiting. Her diarrhea has resolved. Objective:   Vital signs  Temperature 98.3 Heart rate 84 Respiratory rate 27 Blood pressure 155/67 Pulse ox 95%    Exam More alert and awake Right gaze preference, pupils equal reactive to light Supple Neck,No JVD, No cervical lymphadenopathy appriciated.  Trach in Place , Wound noted Symmetrical Chest wall movement, decreased breath sounds bilaterally with scattered rhonchi Irregular,No Gallops,Rubs or new Murmurs, No Parasternal Heave +ve B.Sounds, Abd Soft, abdomen wound noted with Dehiscence, ostomy noted ,  No Cyanosis, Clubbing or edema, No new Rash or bruise     I&Os unknown PEG tube noted Foley - yes Trach yes  Data Review   CBC  Recent Labs Lab 11/02/13 0555 11/05/13 0530 11/06/13 1246  WBC 7.1 6.6 9.1  HGB 8.2* 8.9* 9.4*  HCT 24.5* 26.8* 28.8*  PLT 193 138* 128*  MCV 86.0 86.2 87.5  MCH 28.8 28.6 28.6  MCHC 33.5 33.2 32.6  RDW 14.7 14.9 15.3  LYMPHSABS  --   --  0.5*  MONOABS  --   --  0.5  EOSABS  --   --  0.0  BASOSABS  --   --  0.0    Chemistries   Recent Labs Lab 11/02/13 0555 11/05/13 0530 11/06/13 1246 11/07/13 0530  NA 140 140  --  143  K 3.6* 3.6*  --  2.8*  CL 99 94*  --  95*  CO2 29 31  --  32  GLUCOSE 262* 308*  --  44*  BUN 59* 62*  --  64*  CREATININE 1.63* 1.57*  --  1.85*  CALCIUM 8.8  9.1  --  9.6  MG  --  1.6  --   --   AST  --   --  13  --   ALT  --   --  20  --   ALKPHOS  --   --  102  --   BILITOT  --   --  0.5  --       Assessment & Plan   VDRF/trach status continue with ATC Acute on chronic renal failure, improving Anemia of chronic disease and acute blood loss 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 Candidemia likely line sepsis related on Diflucan Paroxysmal atrial fib, heart rate has been stable Escherichia coli UTI Hypertension controlled Protein calorie malnutrition on Vital 1.5 Encephalopathy/agitation of propofol on when necessary fentanyl and Klonopin scheduled Diabetes  mellitus type 2 uncontrolled Generalized weakness continue with PT OT Sigmoid colon perforation status post diverting colostomy Bacteremia with gram-positive cocci in clusters; started on vancomycin yesterday Constipation Edema continue with Ace wrapping Tracheal wound noted with cartilage showing Acute on chronic kidney failure  Plan Start IV half-normal saline with 40 mEq KCl at 75 mL per hour x2 L  BMP in a.m. KDur 40 mEq daily per tube   Code Status: Full  Family Communication: Discussed with family at bedside  DVT Prophylaxis heparin   Carron CurieAli Ayodeji Keimig M.D on 11/07/2013 at 2:11 PM

## 2013-11-08 LAB — BASIC METABOLIC PANEL
BUN: 62 mg/dL — AB (ref 6–23)
CALCIUM: 9.6 mg/dL (ref 8.4–10.5)
CO2: 34 meq/L — AB (ref 19–32)
CREATININE: 1.85 mg/dL — AB (ref 0.50–1.10)
Chloride: 95 mEq/L — ABNORMAL LOW (ref 96–112)
GFR calc Af Amer: 32 mL/min — ABNORMAL LOW (ref >90)
GFR calc non Af Amer: 27 mL/min — ABNORMAL LOW (ref >90)
GLUCOSE: 183 mg/dL — AB (ref 70–99)
Potassium: 4.4 mEq/L (ref 3.7–5.3)
Sodium: 142 mEq/L (ref 137–147)

## 2013-11-08 NOTE — Progress Notes (Signed)
[facility]                                                                                              Progress note     Patient Demographics  Sarah Tran, is a 68 y.o. female  ZOX:[id number]  [id number]  DOB - 1946/05/28  Admit date - 10/25/2013  Admitting Physician Carron CurieAli Caydence Enck, MD  Outpatient Primary MD for the patient is No PCP Per Patient  LOS - 14  Chief complaint   Respiratory failure      Wound   Bacteremia      Subjective:   Sarah Tran denies any chest pain shortness of breath nausea or vomiting.  Objective:   Vital signs  Temperature 97.3 Heart rate 85 Respiratory rate 15 Blood pressure 161/91 Pulse ox 94%    Exam  alert and awake,OX3 Right gaze preference, pupils equal reactive to light Supple Neck,No JVD, No cervical lymphadenopathy appriciated.  Trach in Place , Wound noted Symmetrical Chest wall movement, decreased breath sounds bilaterally with scattered rhonchi Irregular,No Gallops,Rubs or new Murmurs, No Parasternal Heave +ve B.Sounds, Abd Soft, abdomen wound noted with Dehiscence, ostomy noted ,  No Cyanosis, Clubbing or edema, No new Rash or bruise     I&Os 1360/4150 PEG tube noted Foley - yes Trach yes  Data Review   CBC  Recent Labs Lab 11/02/13 0555 11/05/13 0530 11/06/13 1246  WBC 7.1 6.6 9.1  HGB 8.2* 8.9* 9.4*  HCT 24.5* 26.8* 28.8*  PLT 193 138* 128*  MCV 86.0 86.2 87.5  MCH 28.8 28.6 28.6  MCHC 33.5 33.2 32.6  RDW 14.7 14.9 15.3  LYMPHSABS  --   --  0.5*  MONOABS  --   --  0.5  EOSABS  --   --  0.0  BASOSABS  --   --  0.0    Chemistries   Recent Labs Lab 11/02/13 0555 11/05/13 0530 11/06/13 1246 11/07/13 0530 11/08/13 0605  NA 140 140  --  143 142  K 3.6* 3.6*  --  2.8* 4.4  CL 99 94*  --  95* 95*  CO2 29 31  --  32 34*  GLUCOSE 262* 308*  --  44* 183*  BUN 59* 62*  --  64* 62*  CREATININE 1.63* 1.57*  --   1.85* 1.85*  CALCIUM 8.8 9.1  --  9.6 9.6  MG  --  1.6  --   --   --   AST  --   --  13  --   --   ALT  --   --  20  --   --   ALKPHOS  --   --  102  --   --   BILITOT  --   --  0.5  --   --       Assessment & Plan   VDRF/trach off Vent. 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 Candidemia likely line sepsis resolved on Diflucan Paroxysmal atrial fib, heart rate has been stable  Escherichia coli UTI Rxed Hypertension controlled Protein calorie malnutrition on Vital 1.5 Encephalopathy/agitation of propofol on when necessary fentanyl and Klonopin scheduled Diabetes mellitus type 2 uncontrolled Generalized weakness continue with PT/OT Sigmoid colon perforation status post diverting colostomy Bacteremia with gram-positive cocci in clusters; started on vancomycin yesterday Constipation Edema continue with Ace wrapping Tracheal wound noted with cartilage showing being seen by ENT  Plan Change IVF to NS BMP in AM  Code Status: Full  Family Communication: Discussed with family at bedside  DVT Prophylaxis heparin   Carron Curie M.D on 11/08/2013 at 2:12 PM

## 2013-11-09 ENCOUNTER — Other Ambulatory Visit (HOSPITAL_COMMUNITY): Payer: Medicare Other

## 2013-11-09 LAB — URINALYSIS, ROUTINE W REFLEX MICROSCOPIC
BILIRUBIN URINE: NEGATIVE
Glucose, UA: NEGATIVE mg/dL
Hgb urine dipstick: NEGATIVE
KETONES UR: NEGATIVE mg/dL
Leukocytes, UA: NEGATIVE
NITRITE: NEGATIVE
PH: 8.5 — AB (ref 5.0–8.0)
Protein, ur: 30 mg/dL — AB
SPECIFIC GRAVITY, URINE: 1.009 (ref 1.005–1.030)
Urobilinogen, UA: 1 mg/dL (ref 0.0–1.0)

## 2013-11-09 LAB — BASIC METABOLIC PANEL
BUN: 62 mg/dL — ABNORMAL HIGH (ref 6–23)
CO2: 30 mEq/L (ref 19–32)
CREATININE: 1.91 mg/dL — AB (ref 0.50–1.10)
Calcium: 8.7 mg/dL (ref 8.4–10.5)
Chloride: 91 mEq/L — ABNORMAL LOW (ref 96–112)
GFR, EST AFRICAN AMERICAN: 31 mL/min — AB (ref >90)
GFR, EST NON AFRICAN AMERICAN: 26 mL/min — AB (ref >90)
Glucose, Bld: 170 mg/dL — ABNORMAL HIGH (ref 70–99)
Potassium: 3.7 mEq/L (ref 3.7–5.3)
Sodium: 132 mEq/L — ABNORMAL LOW (ref 137–147)

## 2013-11-09 LAB — EXPECTORATED SPUTUM ASSESSMENT W REFEX TO RESP CULTURE

## 2013-11-09 LAB — BLOOD GAS, ARTERIAL
ACID-BASE EXCESS: 8.7 mmol/L — AB (ref 0.0–2.0)
BICARBONATE: 31 meq/L — AB (ref 20.0–24.0)
FIO2: 0.28 %
O2 SAT: 98.8 %
Patient temperature: 98.6
TCO2: 31.9 mmol/L (ref 0–100)
pCO2 arterial: 29.3 mmHg — ABNORMAL LOW (ref 35.0–45.0)
pH, Arterial: 7.628 (ref 7.350–7.450)
pO2, Arterial: 99.4 mmHg (ref 80.0–100.0)

## 2013-11-09 LAB — CBC
HEMATOCRIT: 26.8 % — AB (ref 36.0–46.0)
Hemoglobin: 8.6 g/dL — ABNORMAL LOW (ref 12.0–15.0)
MCH: 28.5 pg (ref 26.0–34.0)
MCHC: 32.1 g/dL (ref 30.0–36.0)
MCV: 88.7 fL (ref 78.0–100.0)
Platelets: 109 10*3/uL — ABNORMAL LOW (ref 150–400)
RBC: 3.02 MIL/uL — ABNORMAL LOW (ref 3.87–5.11)
RDW: 15.4 % (ref 11.5–15.5)
WBC: 7.6 10*3/uL (ref 4.0–10.5)

## 2013-11-09 LAB — EXPECTORATED SPUTUM ASSESSMENT W GRAM STAIN, RFLX TO RESP C

## 2013-11-09 LAB — URINE MICROSCOPIC-ADD ON

## 2013-11-09 NOTE — Progress Notes (Signed)
[facility]                                                                                              Progress note     Patient Demographics  Sarah Tran, is a 68 y.o. female  QBH:[id number]  OXB:[id number]  DOB - 30-Apr-1946  Admit date - 10/25/2013  Admitting Physician Carron Curie, MD  Outpatient Primary MD for the patient is No PCP Per Patient  LOS - 15  Chief complaint   Respiratory failure      Wound   Bacteremia      Subjective:   Sarah Tran denies any chest pain shortness of breath nausea or vomiting.  Objective:   Vital signs  Temperature 97 Heart rate 83 Respiratory rate 21 Blood pressure 130/78 Pulse ox 98%    Exam  alert and awake,OX3 Right gaze preference, pupils equal reactive to light Supple Neck,No JVD, No cervical lymphadenopathy appriciated.  Trach in Place , Wound noted Symmetrical Chest wall movement, decreased breath sounds bilaterally with scattered rhonchi Irregular,No Gallops,Rubs or new Murmurs, No Parasternal Heave +ve B.Sounds, Abd Soft, abdomen wound noted with Dehiscence, ostomy noted ,  No Cyanosis, Clubbing or edema, No new Rash or bruise     I&Os 3520/3000 PEG tube noted Foley - yes Trach yes  Data Review   CBC  Recent Labs Lab 11/05/13 0530 11/06/13 1246 11/09/13 0855  WBC 6.6 9.1 7.6  HGB 8.9* 9.4* 8.6*  HCT 26.8* 28.8* 26.8*  PLT 138* 128* 109*  MCV 86.2 87.5 88.7  MCH 28.6 28.6 28.5  MCHC 33.2 32.6 32.1  RDW 14.9 15.3 15.4  LYMPHSABS  --  0.5*  --   MONOABS  --  0.5  --   EOSABS  --  0.0  --   BASOSABS  --  0.0  --     Chemistries   Recent Labs Lab 11/05/13 0530 11/06/13 1246 11/07/13 0530 11/08/13 0605 11/09/13 0855  NA 140  --  143 142 132*  K 3.6*  --  2.8* 4.4 3.7  CL 94*  --  95* 95* 91*  CO2 31  --  32 34* 30  GLUCOSE 308*  --  44* 183* 170*  BUN 62*  --  64* 62* 62*  CREATININE 1.57*  --  1.85*  1.85* 1.91*  CALCIUM 9.1  --  9.6 9.6 8.7  MG 1.6  --   --   --   --   AST  --  13  --   --   --   ALT  --  20  --   --   --   ALKPHOS  --  102  --   --   --   BILITOT  --  0.5  --   --   --       Assessment & Plan   VDRF/trach off Vent. 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 Candidemia likely line sepsis resolved on Diflucan Paroxysmal atrial fib, heart rate has been stable  Escherichia coli UTI Rxed Hypertension controlled Protein calorie malnutrition on Vital 1.5 Encephalopathy/agitation Ativan/Haldol when necessary and Klonopin scheduled Diabetes mellitus type 2 uncontrolled Generalized weakness continue with PT/OT Sigmoid colon perforation status post diverting colostomy Bacteremia with gram-positive cocci in clusters; started on vancomycin yesterday Constipation Edema continue with Ace wrapping Tracheal wound noted with cartilage showing being seen by ENT Hyponatremia Thrombocytopenia  Plan Normal saline 500 mL bolus Decrease Lasix to 60 mg per tube twice a day Change Pepcid to Protonix Check CBC BMP in a.m. Ativan/Haldol when necessary Critical care time 34 minutes  Code Status: Full  Family Communication: Discussed with family at bedside  DVT Prophylaxis heparin   Carron CurieAli Shamell Hittle M.D on 11/09/2013 at 1:52 PM

## 2013-11-10 LAB — BASIC METABOLIC PANEL
BUN: 67 mg/dL — AB (ref 6–23)
CO2: 29 mEq/L (ref 19–32)
CREATININE: 2.2 mg/dL — AB (ref 0.50–1.10)
Calcium: 9.3 mg/dL (ref 8.4–10.5)
Chloride: 93 mEq/L — ABNORMAL LOW (ref 96–112)
GFR calc Af Amer: 26 mL/min — ABNORMAL LOW (ref >90)
GFR, EST NON AFRICAN AMERICAN: 22 mL/min — AB (ref >90)
Glucose, Bld: 111 mg/dL — ABNORMAL HIGH (ref 70–99)
Potassium: 3.8 mEq/L (ref 3.7–5.3)
Sodium: 136 mEq/L — ABNORMAL LOW (ref 137–147)

## 2013-11-10 LAB — URINE CULTURE
COLONY COUNT: NO GROWTH
CULTURE: NO GROWTH

## 2013-11-10 LAB — CBC
HEMATOCRIT: 27.3 % — AB (ref 36.0–46.0)
Hemoglobin: 8.7 g/dL — ABNORMAL LOW (ref 12.0–15.0)
MCH: 28.2 pg (ref 26.0–34.0)
MCHC: 31.9 g/dL (ref 30.0–36.0)
MCV: 88.6 fL (ref 78.0–100.0)
Platelets: 118 10*3/uL — ABNORMAL LOW (ref 150–400)
RBC: 3.08 MIL/uL — ABNORMAL LOW (ref 3.87–5.11)
RDW: 15.9 % — AB (ref 11.5–15.5)
WBC: 8.4 10*3/uL (ref 4.0–10.5)

## 2013-11-10 NOTE — Progress Notes (Signed)
[facility]                                                                                              Progress note     Patient Demographics  Sarah Tran, is a 68 y.o. female  MQK:[id number]  [id number]:[id number]  DOB - 1946/04/09  Admit date - 10/25/2013  Admitting Physician Carron Curie, MD  Outpatient Primary MD for the patient is No PCP Per Patient  LOS - 16  Chief complaint   Respiratory failure      Wound   Bacteremia      Subjective:   Sarah Tran denies any chest pain shortness of breath nausea or vomiting.  Objective:   Vital signs  Temperature 98.2 Heart rate 71 Respiratory rate 20 Blood pressure 106/74 Pulse ox 96%    Exam  alert and awake,OX3 Right gaze preference, pupils equal reactive to light Supple Neck,No JVD, No cervical lymphadenopathy appriciated.  Trach in Place , Wound noted Symmetrical Chest wall movement, decreased breath sounds bilaterally with scattered rhonchi Irregular,No Gallops,Rubs or new Murmurs, No Parasternal Heave +ve B.Sounds, Abd Soft, abdomen wound noted with Dehiscence, ostomy noted ,  No Cyanosis, Clubbing or edema, No new Rash or bruise     I&Os 1420/1750 PEG tube noted Foley - yes Trach yes  Data Review   CBC  Recent Labs Lab 11/05/13 0530 11/06/13 1246 11/09/13 0855 11/10/13 0620  WBC 6.6 9.1 7.6 8.4  HGB 8.9* 9.4* 8.6* 8.7*  HCT 26.8* 28.8* 26.8* 27.3*  PLT 138* 128* 109* 118*  MCV 86.2 87.5 88.7 88.6  MCH 28.6 28.6 28.5 28.2  MCHC 33.2 32.6 32.1 31.9  RDW 14.9 15.3 15.4 15.9*  LYMPHSABS  --  0.5*  --   --   MONOABS  --  0.5  --   --   EOSABS  --  0.0  --   --   BASOSABS  --  0.0  --   --     Chemistries   Recent Labs Lab 11/05/13 0530 11/06/13 1246 11/07/13 0530 11/08/13 0605 11/09/13 0855 11/10/13 0620  NA 140  --  143 142 132* 136*  K 3.6*  --  2.8* 4.4 3.7 3.8  CL 94*  --  95* 95* 91* 93*  CO2 31  --   32 34* 30 29  GLUCOSE 308*  --  44* 183* 170* 111*  BUN 62*  --  64* 62* 62* 67*  CREATININE 1.57*  --  1.85* 1.85* 1.91* 2.20*  CALCIUM 9.1  --  9.6 9.6 8.7 9.3  MG 1.6  --   --   --   --   --   AST  --  13  --   --   --   --   ALT  --  20  --   --   --   --   ALKPHOS  --  102  --   --   --   --   BILITOT  --  0.5  --   --   --   --  Assessment & Plan   VDRF/trach off Vent. continue with ATC. 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 Candidemia likely line sepsis resolved on Diflucan Paroxysmal atrial fib, heart rate has been stable Escherichia coli UTI Rxed Hypertension controlled Protein calorie malnutrition on Vital 1.5 Encephalopathy/agitation Ativan/Haldol when necessary and Klonopin scheduled Diabetes mellitus type 2 uncontrolled Generalized weakness continue with PT/OT Sigmoid colon perforation status post diverting colostomy Bacteremia with gram-positive cocci in clusters; started on vancomycin yesterday Constipation Edema continue with Ace wrapping Tracheal wound noted with cartilage showing being seen by ENT Hyponatremia Thrombocytopenia  Plan Decrease Lasix to 40 mg twice a day and hold it today Normal saline bolus Check BMP, chest x-ray in a.m. Continue with Protonix  Critical care time 32 minutes  Code Status: Full  Family Communication: Discussed with family at bedside  DVT Prophylaxis heparin   Carron CurieAli Chalise Pe M.D on 11/10/2013 at 3:01 PM

## 2013-11-11 ENCOUNTER — Other Ambulatory Visit (HOSPITAL_COMMUNITY): Payer: Medicare Other

## 2013-11-11 LAB — BASIC METABOLIC PANEL
BUN: 71 mg/dL — AB (ref 6–23)
CHLORIDE: 95 meq/L — AB (ref 96–112)
CO2: 29 mEq/L (ref 19–32)
CREATININE: 2.34 mg/dL — AB (ref 0.50–1.10)
Calcium: 8.7 mg/dL (ref 8.4–10.5)
GFR calc non Af Amer: 21 mL/min — ABNORMAL LOW (ref >90)
GFR, EST AFRICAN AMERICAN: 24 mL/min — AB (ref >90)
Glucose, Bld: 199 mg/dL — ABNORMAL HIGH (ref 70–99)
Potassium: 4.4 mEq/L (ref 3.7–5.3)
Sodium: 136 mEq/L — ABNORMAL LOW (ref 137–147)

## 2013-11-11 NOTE — Progress Notes (Addendum)
[facility]                                                                                              Progress note     Patient Demographics  Sarah Tran, is a 68 y.o. female  ZOX:[id number]  WJX:[id number]  DOB - 1945-11-02  Admit date - 10/25/2013  Admitting Physician Carron CurieAli Brizeyda Holtmeyer, MD  Outpatient Primary MD for the patient is No PCP Per Patient  LOS - 17  Chief complaint   Respiratory failure      Wound   Bacteremia      Subjective:   Sarah Tran denies any chest pain shortness of breath nausea or vomiting.  Objective:   Vital signs  Temperature 97.1 Heart rate 81 Respiratory rate 17 Blood pressure 123/72 Pulse ox 99%     Exam  alert and awake,OX3/somnolent Right gaze preference, pupils equal reactive to light Supple Neck,No JVD, No cervical lymphadenopathy appriciated.  Trach in Place , Wound noted Symmetrical Chest wall movement, decreased breath sounds bilaterally with scattered rhonchi Irregular,No Gallops,Rubs or new Murmurs, No Parasternal Heave +ve B.Sounds, Abd Soft, abdomen wound noted with Dehiscence, ostomy noted ,  No Cyanosis, Clubbing or edema, No new Rash or bruise     I&Os 2200/125 PEG tube noted Foley - yes Trach yes  Data Review   CBC  Recent Labs Lab 11/05/13 0530 11/06/13 1246 11/09/13 0855 11/10/13 0620  WBC 6.6 9.1 7.6 8.4  HGB 8.9* 9.4* 8.6* 8.7*  HCT 26.8* 28.8* 26.8* 27.3*  PLT 138* 128* 109* 118*  MCV 86.2 87.5 88.7 88.6  MCH 28.6 28.6 28.5 28.2  MCHC 33.2 32.6 32.1 31.9  RDW 14.9 15.3 15.4 15.9*  LYMPHSABS  --  0.5*  --   --   MONOABS  --  0.5  --   --   EOSABS  --  0.0  --   --   BASOSABS  --  0.0  --   --     Chemistries   Recent Labs Lab 11/05/13 0530 11/06/13 1246 11/07/13 0530 11/08/13 0605 11/09/13 0855 11/10/13 0620 11/11/13 0500  NA 140  --  143 142 132* 136* 136*  K 3.6*  --  2.8* 4.4 3.7 3.8 4.4  CL 94*   --  95* 95* 91* 93* 95*  CO2 31  --  32 34* 30 29 29   GLUCOSE 308*  --  44* 183* 170* 111* 199*  BUN 62*  --  64* 62* 62* 67* 71*  CREATININE 1.57*  --  1.85* 1.85* 1.91* 2.20* 2.34*  CALCIUM 9.1  --  9.6 9.6 8.7 9.3 8.7  MG 1.6  --   --   --   --   --   --   AST  --  13  --   --   --   --   --   ALT  --  20  --   --   --   --   --   ALKPHOS  --  102  --   --   --   --   --  BILITOT  --  0.5  --   --   --   --   --       Assessment & Plan   VDRF/trach off Vent. continue with ATC. 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 Candidemia likely line sepsis resolved on Diflucan Paroxysmal atrial fib, heart rate has been stable Escherichia coli UTI Rxed Hypertension controlled Protein calorie malnutrition on Vital 1.5 Encephalopathy/agitation Ativan/Haldol when necessary and Klonopin scheduled Diabetes mellitus type 2 uncontrolled Generalized weakness continue with PT/OT Sigmoid colon perforation status post diverting colostomy Bacteremia with gram-positive cocci in clusters; started on vancomycin yesterday Constipation Edema continue with Ace wrapping Tracheal wound noted with cartilage showing being seen by ENT Hyponatremia Thrombocytopenia  Plan Decrease Lasix to 40 mg once a day  Normal saline bolus Check BMP,  in a.m.   Critical care time 34 minutes  Code Status: Full  Family Communication: None at bedside  DVT Prophylaxis heparin   Carron Curie M.D on 11/11/2013 at 1:48 PM

## 2013-11-12 ENCOUNTER — Other Ambulatory Visit (HOSPITAL_COMMUNITY): Payer: Medicare Other

## 2013-11-12 LAB — BASIC METABOLIC PANEL
BUN: 68 mg/dL — ABNORMAL HIGH (ref 6–23)
CO2: 27 mEq/L (ref 19–32)
CREATININE: 2.32 mg/dL — AB (ref 0.50–1.10)
Calcium: 9 mg/dL (ref 8.4–10.5)
Chloride: 97 mEq/L (ref 96–112)
GFR calc Af Amer: 24 mL/min — ABNORMAL LOW (ref >90)
GFR calc non Af Amer: 21 mL/min — ABNORMAL LOW (ref >90)
GLUCOSE: 56 mg/dL — AB (ref 70–99)
Potassium: 4.1 mEq/L (ref 3.7–5.3)
Sodium: 140 mEq/L (ref 137–147)

## 2013-11-12 LAB — CULTURE, RESPIRATORY

## 2013-11-12 LAB — CULTURE, RESPIRATORY W GRAM STAIN

## 2013-11-12 NOTE — Progress Notes (Signed)
[facility]                                                                                              Progress note     Patient Demographics  Sarah Tran, is a 68 y.o. female  ZOX:[id number]  WJX:[id number]  DOB - Nov 21, 1945  Admit date - 10/25/2013  Admitting Physician Carron CurieAli Birl Lobello, MD  Outpatient Primary MD for the patient is No PCP Per Patient  LOS - 18  Chief complaint   Respiratory failure      Wound   Bacteremia      Subjective:   Sarah Tran denies any chest pain shortness of breath nausea or vomiting.  Objective:   Vital signs  Temperature 98 Heart rate 9021 Respiratory rate 17 Blood pressure 140/76 Pulse ox 97%     Exam  somnolent? Drowsy , pupils equal reactive to light, tongue protruding Supple Neck,No JVD, No cervical lymphadenopathy appriciated.  Trach in Place , Wound noted Symmetrical Chest wall movement, decreased breath sounds bilaterally with scattered rhonchi Irregular,No Gallops,Rubs or new Murmurs, No Parasternal Heave +ve B.Sounds, Abd Soft, abdomen wound noted with Dehiscence, ostomy noted ,  No Cyanosis, Clubbing or edema, No new Rash or bruise     I&Os 635/2100 PEG tube noted Foley - yes Trach yes  Data Review   CBC  Recent Labs Lab 11/06/13 1246 11/09/13 0855 11/10/13 0620  WBC 9.1 7.6 8.4  HGB 9.4* 8.6* 8.7*  HCT 28.8* 26.8* 27.3*  PLT 128* 109* 118*  MCV 87.5 88.7 88.6  MCH 28.6 28.5 28.2  MCHC 32.6 32.1 31.9  RDW 15.3 15.4 15.9*  LYMPHSABS 0.5*  --   --   MONOABS 0.5  --   --   EOSABS 0.0  --   --   BASOSABS 0.0  --   --     Chemistries   Recent Labs Lab 11/06/13 1246  11/08/13 0605 11/09/13 0855 11/10/13 0620 11/11/13 0500 11/12/13 0500  NA  --   < > 142 132* 136* 136* 140  K  --   < > 4.4 3.7 3.8 4.4 4.1  CL  --   < > 95* 91* 93* 95* 97  CO2  --   < > 34* 30 29 29 27   GLUCOSE  --   < > 183* 170* 111* 199* 56*   BUN  --   < > 62* 62* 67* 71* 68*  CREATININE  --   < > 1.85* 1.91* 2.20* 2.34* 2.32*  CALCIUM  --   < > 9.6 8.7 9.3 8.7 9.0  AST 13  --   --   --   --   --   --   ALT 20  --   --   --   --   --   --   ALKPHOS 102  --   --   --   --   --   --   BILITOT 0.5  --   --   --   --   --   --   < > = values in this  interval not displayed.    Assessment & Plan   VDRF/trach off Vent. continue with ATC. 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 Candidemia likely line sepsis resolved on Diflucan Paroxysmal atrial fib, heart rate has been stable Escherichia coli UTI Rxed Hypertension controlled Protein calorie malnutrition on Vital 1.5 Encephalopathy/agitation Ativan/Haldol when necessary and Klonopin scheduled Diabetes mellitus type 2 uncontrolled Generalized weakness continue with PT/OT Sigmoid colon perforation status post diverting colostomy Bacteremia with gram-positive cocci in clusters; started on vancomycin yesterday Constipation Edema continue with Ace wrapping Tracheal wound noted with cartilage showing being seen by ENT Hyponatremia Thrombocytopenia Tongue protruding? Allergic reaction/increased work of breathing Plan IV steroids Benadryl Check chest x-ray   Critical care time 36 minutes  Code Status: Full  Family Communication: None at bedside  DVT Prophylaxis heparin   Carron Curie M.D on 11/12/2013 at 12:32 PM

## 2013-11-13 LAB — BASIC METABOLIC PANEL
BUN: 72 mg/dL — ABNORMAL HIGH (ref 6–23)
CO2: 27 mEq/L (ref 19–32)
CREATININE: 2.35 mg/dL — AB (ref 0.50–1.10)
Calcium: 9.2 mg/dL (ref 8.4–10.5)
Chloride: 93 mEq/L — ABNORMAL LOW (ref 96–112)
GFR, EST AFRICAN AMERICAN: 24 mL/min — AB (ref >90)
GFR, EST NON AFRICAN AMERICAN: 21 mL/min — AB (ref >90)
Glucose, Bld: 340 mg/dL — ABNORMAL HIGH (ref 70–99)
Potassium: 5.4 mEq/L — ABNORMAL HIGH (ref 3.7–5.3)
Sodium: 132 mEq/L — ABNORMAL LOW (ref 137–147)

## 2013-11-13 LAB — CBC
HCT: 26.2 % — ABNORMAL LOW (ref 36.0–46.0)
Hemoglobin: 8.4 g/dL — ABNORMAL LOW (ref 12.0–15.0)
MCH: 28.9 pg (ref 26.0–34.0)
MCHC: 32.1 g/dL (ref 30.0–36.0)
MCV: 90 fL (ref 78.0–100.0)
PLATELETS: 116 10*3/uL — AB (ref 150–400)
RBC: 2.91 MIL/uL — ABNORMAL LOW (ref 3.87–5.11)
RDW: 16 % — AB (ref 11.5–15.5)
WBC: 8.2 10*3/uL (ref 4.0–10.5)

## 2013-11-13 NOTE — Progress Notes (Signed)
[facility]                                                                                              Progress note     Patient Demographics  Sarah Tran, is a 68 y.o. female  BCW:[id number]  [id number]  DOB - [birth year]  Admit date - 10/25/2013  Admitting Physician Carron Curie, MD  Outpatient Primary MD for the patient is No PCP Per Patient  LOS - 19  Chief complaint   Respiratory failure      Wound   Bacteremia      Subjective:   [name] denies any chest pain shortness of breath nausea or vomiting.  Objective:   Vital signs  Temperature 98 Heart rate 9021 Respiratory rate 17 Blood pressure 140/76 Pulse ox 97%     Exam  somnolent? Drowsy , pupils equal reactive to light, tongue protruding Supple Neck,No JVD, No cervical lymphadenopathy appriciated.  Trach in Place , Wound noted Symmetrical Chest wall movement, decreased breath sounds bilaterally with scattered rhonchi Irregular,No Gallops,Rubs or new Murmurs, No Parasternal Heave +ve B.Sounds, Abd Soft, abdomen wound noted with Dehiscence, ostomy noted ,  No Cyanosis, Clubbing or edema, No new Rash or bruise     I&Os 635/2100 PEG tube noted Foley - yes Trach yes  Data Review   CBC  Recent Labs Lab 11/09/13 0855 11/10/13 0620 11/13/13 0530  WBC 7.6 8.4 8.2  HGB 8.6* 8.7* 8.4*  HCT 26.8* 27.3* 26.2*  PLT 109* 118* 116*  MCV 88.7 88.6 90.0  MCH 28.5 28.2 28.9  MCHC 32.1 31.9 32.1  RDW 15.4 15.9* 16.0*    Chemistries   Recent Labs Lab 11/09/13 0855 11/10/13 0620 11/11/13 0500 11/12/13 0500 11/13/13 0530  NA 132* 136* 136* 140 132*  K 3.7 3.8 4.4 4.1 5.4*  CL 91* 93* 95* 97 93*  CO2 30 29 29 27 27   GLUCOSE 170* 111* 199* 56* 340*  BUN 62* 67* 71* 68* 72*  CREATININE 1.91* 2.20* 2.34* 2.32* 2.35*  CALCIUM 8.7 9.3 8.7 9.0 9.2      Assessment & Plan   VDRF/trach off Vent. continue  with ATC. 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 Candidemia likely line sepsis resolved on Diflucan Paroxysmal atrial fib, heart rate has been stable Escherichia coli UTI Rxed Hypertension controlled Protein calorie malnutrition on Vital 1.5 Encephalopathy/agitation Ativan/Haldol when necessary and Klonopin scheduled Diabetes mellitus type 2 uncontrolled due to corticosteroids Generalized weakness continue with PT/OT Sigmoid colon perforation status post diverting colostomy Bacteremia with gram-positive cocci in clusters; started on vancomycin yesterday Constipation Edema continue with Ace wrapping Tracheal wound noted with cartilage showing being seen by ENT Hyponatremia Thrombocytopenia Tongue protruding? Allergic reaction/increased work of breathing  Plan  Decrease IV steroids Benadryl PRN Increase levemir    Critical care time 32 minutes  Code Status: Full  Family Communication: None at bedside  DVT Prophylaxis heparin   Carron Curie M.D on 11/13/2013 at 1:19 PM
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
## 2013-11-15 LAB — CULTURE, BLOOD (ROUTINE X 2)
CULTURE: NO GROWTH
Culture: NO GROWTH

## 2013-11-17 LAB — CBC
HCT: 27.3 % — ABNORMAL LOW (ref 36.0–46.0)
Hemoglobin: 8.9 g/dL — ABNORMAL LOW (ref 12.0–15.0)
MCH: 28.8 pg (ref 26.0–34.0)
MCHC: 32.6 g/dL (ref 30.0–36.0)
MCV: 88.3 fL (ref 78.0–100.0)
Platelets: 158 10*3/uL (ref 150–400)
RBC: 3.09 MIL/uL — AB (ref 3.87–5.11)
RDW: 16.1 % — AB (ref 11.5–15.5)
WBC: 6.9 10*3/uL (ref 4.0–10.5)

## 2013-11-17 LAB — BASIC METABOLIC PANEL
BUN: 74 mg/dL — ABNORMAL HIGH (ref 6–23)
CALCIUM: 9.5 mg/dL (ref 8.4–10.5)
CO2: 26 meq/L (ref 19–32)
Chloride: 97 mEq/L (ref 96–112)
Creatinine, Ser: 1.93 mg/dL — ABNORMAL HIGH (ref 0.50–1.10)
GFR calc Af Amer: 30 mL/min — ABNORMAL LOW (ref >90)
GFR calc non Af Amer: 26 mL/min — ABNORMAL LOW (ref >90)
Glucose, Bld: 54 mg/dL — ABNORMAL LOW (ref 70–99)
POTASSIUM: 4.2 meq/L (ref 3.7–5.3)
SODIUM: 137 meq/L (ref 137–147)
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
## 2013-11-20 LAB — BASIC METABOLIC PANEL
BUN: 72 mg/dL — ABNORMAL HIGH (ref 6–23)
CALCIUM: 9.5 mg/dL (ref 8.4–10.5)
CO2: 25 mEq/L (ref 19–32)
CREATININE: 2.51 mg/dL — AB (ref 0.50–1.10)
Chloride: 100 mEq/L (ref 96–112)
GFR calc Af Amer: 22 mL/min — ABNORMAL LOW (ref >90)
GFR, EST NON AFRICAN AMERICAN: 19 mL/min — AB (ref >90)
Glucose, Bld: 88 mg/dL (ref 70–99)
Potassium: 4.6 mEq/L (ref 3.7–5.3)
SODIUM: 141 meq/L (ref 137–147)

## 2013-11-20 LAB — CBC
HCT: 26.1 % — ABNORMAL LOW (ref 36.0–46.0)
Hemoglobin: 8.4 g/dL — ABNORMAL LOW (ref 12.0–15.0)
MCH: 29 pg (ref 26.0–34.0)
MCHC: 32.2 g/dL (ref 30.0–36.0)
MCV: 90 fL (ref 78.0–100.0)
PLATELETS: 157 10*3/uL (ref 150–400)
RBC: 2.9 MIL/uL — AB (ref 3.87–5.11)
RDW: 16.3 % — AB (ref 11.5–15.5)
WBC: 5.6 10*3/uL (ref 4.0–10.5)

## 2013-11-21 NOTE — PMR Pre-admission (Signed)
[facility] PMR Admission Coordinator Pre-Admission Assessment  Patient: Sarah Tran is an 68 y.o., female MRN: [id number] DOB: Nov 04, 1945 Height: _0  (160 cm) Weight: 113.853 kg (251 lb)  Insurance Information HMO:     PPO:      PCP:      IPA:      80/20:      OTHER:  PRIMARY: Medicare A & B      Policy#: [id number]      Subscriber: self CM Name:       Phone#:      Fax#:  Pre-Cert#: verified in Visual merchandiser: retired Benefits:  Phone #:      Name:  Eff. Date: A & B: 04-13-06     Deduct: $1260      Out of Pocket Max: none      Life Max: unlimited CIR: 100%      SNF: 100% days 1-20; 80% days 21-100 (100 day visit max) Outpatient: 80%     Co-Pay: 20% Home Health: 100%      Co-Pay: none DME: 80%     Co-Pay: 20% Providers: pt's preference.  Note: pt has 5 full inpatient Medicare days left and 30 full inpatient copay days. Pt will be using some of her copay days.  Emergency Contact Information Contact Information   Name Relation Home Work Mobile   Artman,Katrina Daughter [phone]    Emilio Aspen Daughter   [phone]     Current Medical History   Patient Admitting Diagnosis: deconditioning with multiple medical issues (HTN, exploratory lap and diverting colostomy with appendectomy, respiratory failure following a polyp removal in March 2015)  History of Present Illness: Pt is a 68 year old right-handed morbidly obese African American female. Past medical history of hypertension, renal insufficiency, DVT with chronic Coumadin. Hospital course patient admitted to  [facility] 09/28/2013 for removal of a ileal polyp. Noted perioperative complications balloon catheter became dislodged. She required exploratory laparotomy and diverting colostomy as well as appendectomy secondary to perforated sigmoid colon. Her hospital was complicated by respiratory failure requiring intubation and eventually tracheostomy as well his gastrostomy tube for  nutritional support. She remained in intensive care for 28 days. Patient developed sepsis cultures growing gram-positive cocci in clusters maintained on broad-spectrum antibiotics. [facility] for further vent weaning and wound care. An echocardiogram is completed 10/28/2013 showing mild LVH with ejection fraction of 55-60%. 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 Hospital course complicated by acute renal insufficiency with creatinine 2.51, A. fib with RVR, question angioedema from Invanz reaction and wound dehiscence. Patient's tracheostomy tube was downsized and later be cannulated 11/18/2013. Patient did develop some respiratory stridor placed on a prednisone taper x5 days per pulmonary services. Patient is on a mechanical soft diet and tube feeds have been discontinued.. Patient with chronic anemia hemoglobin varying from 8.4-8.9. Subcutaneous heparin ongoing for DVT prophylaxis. Question plan to resume chronic Coumadin. Patient with profound deconditioning and was making progress with skilled PT/OT/SLP at [facility] was referred for comprehensive rehabilitation program.  Patient's medical record from [facility] has been reviewed by the rehabilitation admission coordinator and physician.   Past Medical History  Past Medical History  Diagnosis Date  . PAF (paroxysmal atrial fibrillation)   . Anemia of chronic disease   . HTN (hypertension)   .  Morbid obesity   . Diabetes mellitus, type II   . Hyperthyroidism   . Hyperlipidemia   . CKD (chronic kidney disease), stage III   . DVT (deep venous thrombosis)     Family History  family history is not on file.  Prior Rehab/Hospitalizations: pt had previous home health PT following her TKR.   Current Medications See  MAR  Note: Allergies per [facility] Record: Penicillin, fish oil, codeine/invanz  Patient's Current Diet:  Dys II, heart healthy/carb modified with chopped meats and nectar thick. Supervision. Pt with food allergies: no nuts, no peanut butter, no seed foods, no grits, no strawberries, no shell fish.  Precautions / Restrictions Precautions Precautions: Fall Precaution Comments: pt with colostomy, PEG, current mid abdominal wound site (with previous wound VAC, wound consult placed, anticipate wound VAC placement), healing trach site/stoma with small air leak (Sig. Food allergies: see above)   Prior Activity Level Community (5-7x/wk): pt got out every day and was driving, helping to care for her grandkids across the street (involved in caring for her 41 yo grandson and other grandkids)  Journalist, newspaper / Equipment Home Assistive Devices/Equipment: Cane (specify quad or straight) (single point cane). Pt used this cane sometimes when her knee was bothering her.   Prior Functional Level Current Functional Level  Bed Mobility  Independent  Max assist (min to mod assist with sup to sit; max assist w/ sit to supi)   Transfers  Independent  Mod assist (mod assist x 2, limited steps to chair)   Mobility - Walk/Wheelchair  Independent  Mod assist (mod assist x 2, limited steps to chair)   Upper Body Dressing  Independent  Mod assist (weak UE's, poor endurance)   Lower Body Dressing  Independent  Max assist   Grooming  Independent  Min assist (set up to wash hands/face, brushing teeth minA)   Eating/Drinking  Independent  Other (supervision and set up)   Toilet Transfer  Independent  Mod assist (mod A x 2 to [facility], also using bed pan)   Bladder Continence   WFL  using bed pan or BSC   Bowel Management  WFL  pt with current colostomy   Stair Climbing   Independent  Other (not assessed)   Communication  WFL  grossly WFL but at times, slightly delayed in  responses   Memory  WFL  appears WFL   Cooking/Meal Prep  Independent      Housework  Independent and family also helped    Money Management  Independent    Driving   Independent, got out everyday     Special needs/care consideration BiPAP/CPAP - yes, has CPAP. Family to bring in her home CPAP. CPM no  Continuous Drip IV no  Dialysis no          Life Vest no  Oxygen no  Special Bed no  Trach Size - healing stoma site, trach pulled out on 11-17-13 Wound Vac (area) - had midline abdominal wound VAC, now taken off. Anticipate a wound care consult and likely placement of wound VAC again. Pt now with wet to dry incision.       Skin - pt with abdominal wound site (previous wound VAC), colostomy site, L upper arm small wound, healing stoma                               Bowel mgmt: colostomy Bladder mgmt: using BSC with assist of  2 or bedpan  Diabetic mgmt - Pt was not previously diabetic but has been receiving insulin over the course of these medical complications.  Previous Home Environment Living Arrangements: Children (lives with dtr Ivin Booty, S-I-L and granddtr Ubaldo Glassing)  Lives With: Family Available Help at Discharge: Family (many involved family members, dtr/S-I-L she lives with do work outside the home. S-I-L gets home from work around 1-3 pm depending if he needs to pick up the boys) Type of Home: House Home Layout: One level Home Access: Stairs to enter Technical brewer of Steps: 4  Discharge Living Setting Plans for Discharge Living Setting: Lives with (comment) (dtr Ivin Booty, S-i-L, and granddtr.) Type of Home at Discharge: House Discharge Home Layout: One level Discharge Home Access: Stairs to enter [place] of Steps: 4  [place] Patient Roles: Caregiver (enjoys caring for her grandkids) Contact Information: dtr Katrina is primary contact Anticipated Caregiver: multiple family members beside Ivin Booty, niece graduating will be  primary helper during day while dtr is at work, other [employer] can also help. Anticipated Caregiver's Contact Information: see above Ability/Limitations of Caregiver: family plans on having someone with pt all the time Caregiver Availability: 24/7 Discharge Plan Discussed with Primary Caregiver: Yes (discussed with dtr Katrina, other dtr, two son-in-laws and pt's brother.) Is Caregiver In Agreement with Plan?: Yes Does Caregiver/Family have Issues with Lodging/Transportation while Pt is in Rehab?: No  Goals/Additional Needs Patient/Family Goal for Rehab: supervision and mod indep. with PT/OT/SLP Expected length of stay: 11-14 days Cultural Considerations: none Dietary Needs: Dys 2, heart healthy, nectar, chopped meats, supervised. Peanut allergy. (No nuts/peanut butter, no seed foods, no grits, no strawberries, no shell fish) Equipment Needs: to be determined Pt/Family Agrees to Admission and willing to participate: Yes (discussed with family on 11-18-13, 11-21-13 and 11-22-13) Program Orientation Provided & Reviewed with Pt/Caregiver Including Roles  & Responsibilities: Yes  Patient Condition: I met with pt and her family (many family members including two daughters, two sons-in-law and 2 grandchildren) on 11-18-13, 11-21-13 and 11-22-13 to explain the possibility and purpose of acute inpatient rehabilitation. This 68 year old patient was previously independent in all aspects of mobility and self care and she played an active role in caring for her grandchildren who live across the street. Pt has now had an extensive medical course following the removal of a colon polyp in mid March. See above details for her medical course regarding her exploratory lap/diverting colostomy and respiratory failure. Patient is currently requiring moderate assistance of 2 for limited steps to the chair and moderate to maximal assistance with basic ADL tasks. In addition, pt has speech/diet progression needs as she is  currently on a dysphagia II diet. She will also benefit from skilled SLP services to address higher level cognitive needs following this prolonged medical course as she was completely independent in the community prior to her surgery. She will benefit greatly from the multi-disciplinary team of skilled PT, OT, SLP and rehab nursing to focus on increasing strength for greater independence in bed mobility, transfers, gait and self care tasks. In addition, pt will benefit from physician intervention to monitor her complex medical status involving issues of diabetic/HTN and coumadin management. Discussed case with Dr. Naaman Plummer and rehab team who stated that pt is a good inpatient rehab candidate. We received medical clearance from Dr. Laren Everts from [facility]. Pt and her large, supportive family are motivated to come to inpatient rehab and will benefit from the intensive services of skilled therapy under rehab physician  guidance. Pt will be admitted today on 11-22-13.    Preadmission Screen Completed By:  Ave Filter, PT, 11/22/2013 12:25 pm ______________________________________________________________________   Discussed status with Dr. Naaman Plummer on 11-22-13 at 1225 pm and received telephone approval for admission today.  Admission Coordinator:  Quentin Mulling [place], PT, time 1225/Date 11/22/13   Assessment/Plan: Diagnosis: deconditioning after multiple medical issues 1. Does the need for close, 24 hr/day  Medical supervision in concert with the patient's rehab needs make it unreasonable for this patient to be served in a less intensive setting? Yes 2. Co-Morbidities requiring supervision/potential complications: afib, htn, morbid obesity, ACD, CKD 3. Due to bladder management, bowel management, safety, skin/wound care, disease management, medication administration, pain management and patient education, does the patient require 24 hr/day rehab nursing? Yes 4. Does the patient require coordinated care of a  physician, rehab nurse, PT (1-2 hrs/day, 5 days/week), OT (1-2 hrs/day, 5 days/week) and SLP (1-2 hrs/day, 5 days/week) to address physical and functional deficits in the context of the above medical diagnosis(es)? Yes Addressing deficits in the following areas: balance, endurance, locomotion, strength, transferring, bowel/bladder control, bathing, dressing, feeding, grooming, toileting, cognition and swallowing 5. Can the patient actively participate in an intensive therapy program of at least 3 hrs of therapy 5 days a week? Yes 6. The potential for patient to make measurable gains while on inpatient rehab is excellent 7. Anticipated functional outcomes upon discharge from inpatients are: modified independent and supervision PT, modified independent and supervision OT, modified independent and supervision SLP 8. Estimated rehab length of stay to reach the above functional goals is: 11-14 days 9. Does the patient have adequate social supports to accommodate these discharge functional goals? Yes 10. Anticipated D/C setting: Home 11. Anticipated post D/C treatments: [facility] therapy 12. Overall Rehab/Functional Prognosis: excellent    RECOMMENDATIONS: This patient's condition is appropriate for continued rehabilitative care in the following setting: CIR Patient has agreed to participate in recommended program. Yes Note that insurance prior authorization may be required for reimbursement for recommended care.  Comment: Admit to CIR today  Meredith Staggers, MD, [place], PT 11/22/2013
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
## 2013-11-22 ENCOUNTER — Other Ambulatory Visit (HOSPITAL_COMMUNITY): Payer: Self-pay | Admitting: Physician Assistant

## 2013-11-22 ENCOUNTER — Inpatient Hospital Stay (HOSPITAL_COMMUNITY)
Admission: RE | Admit: 2013-11-22 | Discharge: 2013-12-12 | DRG: 945 | Disposition: A | Discharge status: Home Health | Payer: Medicare Other | Source: Intra-hospital | Attending: Physical Medicine & Rehabilitation | Admitting: Physical Medicine & Rehabilitation

## 2013-11-22 DIAGNOSIS — D638 Anemia in other chronic diseases classified elsewhere: Secondary | ICD-10-CM | POA: Diagnosis present

## 2013-11-22 DIAGNOSIS — G4733 Obstructive sleep apnea (adult) (pediatric): Secondary | ICD-10-CM | POA: Diagnosis present

## 2013-11-22 DIAGNOSIS — E119 Type 2 diabetes mellitus without complications: Secondary | ICD-10-CM | POA: Diagnosis present

## 2013-11-22 DIAGNOSIS — M169 Osteoarthritis of hip, unspecified: Secondary | ICD-10-CM | POA: Diagnosis present

## 2013-11-22 DIAGNOSIS — N183 Chronic kidney disease, stage 3 unspecified: Secondary | ICD-10-CM | POA: Diagnosis present

## 2013-11-22 DIAGNOSIS — N289 Disorder of kidney and ureter, unspecified: Secondary | ICD-10-CM

## 2013-11-22 DIAGNOSIS — E039 Hypothyroidism, unspecified: Secondary | ICD-10-CM | POA: Diagnosis present

## 2013-11-22 DIAGNOSIS — R0602 Shortness of breath: Secondary | ICD-10-CM | POA: Diagnosis not present

## 2013-11-22 DIAGNOSIS — Z7901 Long term (current) use of anticoagulants: Secondary | ICD-10-CM

## 2013-11-22 DIAGNOSIS — R061 Stridor: Secondary | ICD-10-CM | POA: Diagnosis present

## 2013-11-22 DIAGNOSIS — G7281 Critical illness myopathy: Secondary | ICD-10-CM | POA: Diagnosis present

## 2013-11-22 DIAGNOSIS — M1610 Unilateral primary osteoarthritis, unspecified hip: Secondary | ICD-10-CM | POA: Diagnosis present

## 2013-11-22 DIAGNOSIS — Z96659 Presence of unspecified artificial knee joint: Secondary | ICD-10-CM

## 2013-11-22 DIAGNOSIS — I129 Hypertensive chronic kidney disease with stage 1 through stage 4 chronic kidney disease, or unspecified chronic kidney disease: Secondary | ICD-10-CM | POA: Diagnosis present

## 2013-11-22 DIAGNOSIS — Z430 Encounter for attention to tracheostomy: Secondary | ICD-10-CM

## 2013-11-22 DIAGNOSIS — I824Z9 Acute embolism and thrombosis of unspecified deep veins of unspecified distal lower extremity: Secondary | ICD-10-CM | POA: Diagnosis present

## 2013-11-22 DIAGNOSIS — Z6841 Body Mass Index (BMI) 40.0 and over, adult: Secondary | ICD-10-CM

## 2013-11-22 DIAGNOSIS — I4891 Unspecified atrial fibrillation: Secondary | ICD-10-CM | POA: Diagnosis present

## 2013-11-22 DIAGNOSIS — R5381 Other malaise: Secondary | ICD-10-CM

## 2013-11-22 DIAGNOSIS — Z933 Colostomy status: Secondary | ICD-10-CM

## 2013-11-22 DIAGNOSIS — R791 Abnormal coagulation profile: Secondary | ICD-10-CM | POA: Diagnosis present

## 2013-11-22 DIAGNOSIS — Z931 Gastrostomy status: Secondary | ICD-10-CM

## 2013-11-22 DIAGNOSIS — Z5189 Encounter for other specified aftercare: Principal | ICD-10-CM

## 2013-11-22 DIAGNOSIS — R1310 Dysphagia, unspecified: Secondary | ICD-10-CM | POA: Diagnosis present

## 2013-11-22 DIAGNOSIS — J9620 Acute and chronic respiratory failure, unspecified whether with hypoxia or hypercapnia: Secondary | ICD-10-CM

## 2013-11-22 DIAGNOSIS — F411 Generalized anxiety disorder: Secondary | ICD-10-CM | POA: Diagnosis not present

## 2013-11-22 DIAGNOSIS — E785 Hyperlipidemia, unspecified: Secondary | ICD-10-CM | POA: Diagnosis present

## 2013-11-22 DIAGNOSIS — K9420 Gastrostomy complication, unspecified: Secondary | ICD-10-CM

## 2013-11-22 DIAGNOSIS — Z86718 Personal history of other venous thrombosis and embolism: Secondary | ICD-10-CM

## 2013-11-22 HISTORY — DX: Unspecified atrial fibrillation: I48.91

## 2013-11-22 HISTORY — DX: Obstructive sleep apnea (adult) (pediatric): G47.33

## 2013-11-22 HISTORY — DX: Dependence on other enabling machines and devices: Z99.89

## 2013-11-22 LAB — BASIC METABOLIC PANEL
BUN: 72 mg/dL — ABNORMAL HIGH (ref 6–23)
CALCIUM: 9.5 mg/dL (ref 8.4–10.5)
CO2: 26 mEq/L (ref 19–32)
Chloride: 101 mEq/L (ref 96–112)
Creatinine, Ser: 3.05 mg/dL — ABNORMAL HIGH (ref 0.50–1.10)
GFR, EST AFRICAN AMERICAN: 17 mL/min — AB (ref >90)
GFR, EST NON AFRICAN AMERICAN: 15 mL/min — AB (ref >90)
Glucose, Bld: 67 mg/dL — ABNORMAL LOW (ref 70–99)
Potassium: 4.6 mEq/L (ref 3.7–5.3)
SODIUM: 141 meq/L (ref 137–147)

## 2013-11-22 LAB — T4, FREE: Free T4: 1.39 ng/dL (ref 0.80–1.80)

## 2013-11-22 LAB — PROTIME-INR
INR: 1.01 (ref 0.00–1.49)
Prothrombin Time: 13.1 seconds (ref 11.6–15.2)

## 2013-11-22 LAB — TSH: TSH: 4.22 u[IU]/mL (ref 0.350–4.500)

## 2013-11-22 MED ORDER — ATORVASTATIN CALCIUM 10 MG PO TABS
10.0000 mg | ORAL_TABLET | Freq: Every day | ORAL | Status: DC
  Administered 2013-11-22 – 2013-12-11 (×20): 10 mg via ORAL
  Filled 2013-11-22 (×21): qty 1

## 2013-11-22 MED ORDER — ONDANSETRON HCL 4 MG/2ML IJ SOLN: 4.0000 mg | Freq: Four times a day (QID) | INTRAMUSCULAR | Status: DC | PRN

## 2013-11-22 MED ORDER — WARFARIN - PHARMACIST DOSING INPATIENT
Freq: Every day | Status: DC
  Administered 2013-12-03: 18:00:00

## 2013-11-22 MED ORDER — GLIPIZIDE 10 MG PO TABS
10.0000 mg | ORAL_TABLET | Freq: Every day | ORAL | Status: DC
  Administered 2013-11-23 – 2013-11-29 (×6): 10 mg via ORAL
  Filled 2013-11-22 (×8): qty 1

## 2013-11-22 MED ORDER — SORBITOL 70 % SOLN
30.0000 mL | Freq: Every day | Status: DC | PRN
  Administered 2013-11-23 – 2013-11-26 (×2): 30 mL via ORAL
  Filled 2013-11-22 (×2): qty 30

## 2013-11-22 MED ORDER — FLORANEX PO PACK
1.0000 g | PACK | Freq: Three times a day (TID) | ORAL | Status: DC
  Administered 2013-11-22 – 2013-12-12 (×58): 1 g via ORAL
  Filled 2013-11-22 (×67): qty 1

## 2013-11-22 MED ORDER — NITROGLYCERIN 2 % TD OINT
1.0000 [in_us] | TOPICAL_OINTMENT | Freq: Four times a day (QID) | TRANSDERMAL | Status: DC
  Administered 2013-11-22 – 2013-11-30 (×31): 1 [in_us] via TOPICAL
  Filled 2013-11-22: qty 30

## 2013-11-22 MED ORDER — FAMOTIDINE 20 MG PO TABS
20.0000 mg | ORAL_TABLET | Freq: Two times a day (BID) | ORAL | Status: DC
  Administered 2013-11-22 – 2013-11-24 (×4): 20 mg via ORAL
  Filled 2013-11-22 (×6): qty 1

## 2013-11-22 MED ORDER — POTASSIUM CHLORIDE CRYS ER 20 MEQ PO TBCR
40.0000 meq | EXTENDED_RELEASE_TABLET | Freq: Every day | ORAL | Status: DC
  Administered 2013-11-23 – 2013-12-12 (×20): 40 meq via ORAL
  Filled 2013-11-22 (×22): qty 2

## 2013-11-22 MED ORDER — ONDANSETRON HCL 4 MG PO TABS
4.0000 mg | ORAL_TABLET | Freq: Four times a day (QID) | ORAL | Status: DC | PRN
Start: 2013-11-22 — End: 2013-12-12

## 2013-11-22 MED ORDER — FUROSEMIDE 40 MG PO TABS
40.0000 mg | ORAL_TABLET | Freq: Every day | ORAL | Status: DC
  Administered 2013-11-23 – 2013-12-12 (×20): 40 mg via ORAL
  Filled 2013-11-22 (×22): qty 1

## 2013-11-22 MED ORDER — TIMOLOL MALEATE 0.5 % OP SOLN
1.0000 [drp] | Freq: Two times a day (BID) | OPHTHALMIC | Status: DC
  Administered 2013-11-22 – 2013-12-12 (×40): 1 [drp] via OPHTHALMIC
  Filled 2013-11-22 (×2): qty 5

## 2013-11-22 MED ORDER — PREDNISONE 5 MG PO TABS
15.0000 mg | ORAL_TABLET | Freq: Every day | ORAL | Status: DC
  Administered 2013-11-23 – 2013-12-12 (×20): 15 mg via ORAL
  Filled 2013-11-22 (×24): qty 1

## 2013-11-22 MED ORDER — ACETAMINOPHEN 325 MG PO TABS
325.0000 mg | ORAL_TABLET | ORAL | Status: DC | PRN
  Administered 2013-11-28 – 2013-12-01 (×4): 650 mg via ORAL
  Administered 2013-12-02: 325 mg via ORAL
  Administered 2013-12-02 – 2013-12-04 (×3): 650 mg via ORAL
  Filled 2013-11-22 (×7): qty 2

## 2013-11-22 MED ORDER — ZINC SULFATE 220 (50 ZN) MG PO CAPS
220.0000 mg | ORAL_CAPSULE | Freq: Every day | ORAL | Status: DC
  Administered 2013-11-23 – 2013-12-12 (×20): 220 mg via ORAL
  Filled 2013-11-22 (×22): qty 1

## 2013-11-22 MED ORDER — INSULIN DETEMIR 100 UNIT/ML ~~LOC~~ SOLN
12.0000 [IU] | Freq: Every day | SUBCUTANEOUS | Status: DC
  Administered 2013-11-22 – 2013-11-23 (×2): 12 [IU] via SUBCUTANEOUS
  Filled 2013-11-22 (×3): qty 0.12

## 2013-11-22 MED ORDER — VITAMIN C 500 MG PO TABS
500.0000 mg | ORAL_TABLET | Freq: Two times a day (BID) | ORAL | Status: DC
  Administered 2013-11-22 – 2013-12-12 (×40): 500 mg via ORAL
  Filled 2013-11-22 (×43): qty 1

## 2013-11-22 MED ORDER — CLONAZEPAM 0.5 MG PO TABS
0.5000 mg | ORAL_TABLET | Freq: Two times a day (BID) | ORAL | Status: DC
  Administered 2013-11-22 – 2013-12-12 (×40): 0.5 mg via ORAL
  Filled 2013-11-22 (×40): qty 1

## 2013-11-22 MED ORDER — SODIUM BICARBONATE 650 MG PO TABS
1300.0000 mg | ORAL_TABLET | Freq: Two times a day (BID) | ORAL | Status: DC
  Administered 2013-11-22 – 2013-12-12 (×40): 1300 mg via ORAL
  Filled 2013-11-22 (×45): qty 2

## 2013-11-22 MED ORDER — WARFARIN SODIUM 5 MG PO TABS
5.0000 mg | ORAL_TABLET | Freq: Once | ORAL | Status: AC
  Administered 2013-11-22: 5 mg via ORAL
  Filled 2013-11-22: qty 1

## 2013-11-22 MED ORDER — SENNOSIDES-DOCUSATE SODIUM 8.6-50 MG PO TABS
1.0000 | ORAL_TABLET | Freq: Two times a day (BID) | ORAL | Status: DC
  Administered 2013-11-22 – 2013-12-12 (×39): 1 via ORAL
  Filled 2013-11-22 (×40): qty 1

## 2013-11-22 MED ORDER — CARVEDILOL 6.25 MG PO TABS
6.2500 mg | ORAL_TABLET | Freq: Two times a day (BID) | ORAL | Status: DC
  Administered 2013-11-22 – 2013-11-30 (×16): 6.25 mg via ORAL
  Filled 2013-11-22 (×20): qty 1

## 2013-11-22 MED ORDER — METHIMAZOLE 5 MG PO TABS
5.0000 mg | ORAL_TABLET | Freq: Every day | ORAL | Status: DC
  Administered 2013-11-23 – 2013-12-12 (×20): 5 mg via ORAL
  Filled 2013-11-22 (×22): qty 1

## 2013-11-22 NOTE — H&P (Signed)
Physical Medicine and Rehabilitation Admission H&P   Chief complaint: Weakness  HPI: 68 year old right-handed morbidly obese African American female. Past medical history of hypertension, renal insufficiency, obstructive sleep apnea with CPAP, DVT/A. fib with chronic Coumadin. Hospital course patient admitted to [facility] 09/28/2013 for removal of a ileal polyp. Noted perioperative complications balloon catheter became dislodged. She required exploratory laparotomy and diverting colostomy as well as appendectomy secondary to perforated sigmoid colon. Her hospital was complicated by respiratory failure requiring intubation and eventually tracheostomy as well his gastrostomy tube for nutritional support. She remained in intensive care for 28 days. Patient developed sepsis cultures growing gram-positive cocci in clusters maintained on broad-spectrum antibiotics. [facility] stabilized [facility] for further vent weaning and wound care. An echocardiogram is completed 10/28/2013 showing mild LVH with ejection fraction of 55-60%. 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 Hospital course complicated by acute renal insufficiency with creatinine 2.51, A. fib with RVR, question angioedema from Invanz reaction and wound dehiscence. Patient's tracheostomy tube was downsized and later be cannulated 11/18/2013. Patient did develop some respiratory stridor placed on a prednisone taper x5 days per pulmonary services. Patient is on a mechanical soft diet and tube feeds have been discontinued.. Patient with chronic anemia hemoglobin varying from 8.4-8.9. Subcutaneous heparin ongoing for DVT prophylaxis had been initiated and patient's chronic Coumadin for history of DVT atrial fibrillation resumed 11/22/2013. Patient with profound deconditioning and was  admitted for comprehensive rehabilitation program   Review of Systems  Cardiovascular: Positive for palpitations and leg swelling.  Gastrointestinal: Positive for constipation.  Musculoskeletal: Positive for joint pain and myalgias.   Past Medical History   Diagnosis  Date   .  PAF (paroxysmal atrial fibrillation)    .  Anemia of chronic disease    .  HTN (hypertension)    .  Morbid obesity    .  Diabetes mellitus, type II    .  Hyperthyroidism    .  Hyperlipidemia    .  CKD (chronic kidney disease), stage III    .  DVT (deep venous thrombosis)     No past surgical history on file. right total knee replacement 2005  PCP is Dr. Derek Jack of [facility]. [phone]  Social History: No alcohol or tobacco. [place] with daughter and family. Family works during the day. One level house with steps to entry  Allergies: Penicillin/fish oil/Invanz/codeine  Prescriptions prior to admission: Not available. We'll contact PCP on meds prior to admission  Home:  l[place] One level home 4 steps to entry  Functional History:  independent prior to admission taking care of her grandchildren still driving  Functional Status:  Mobility:  mod assist x2 for mobility and limited steps to chair due to obesity     ADL: Min mod assist for activities of daily living   Cognition:  intact   Physical Exam:  128/70 pulse 77 respirations 18 temp 98.6 oxygen saturation is 92% room air  Physical Exam  Constitutional: She is oriented to person, place, and time.  68 year old right-handed obese African American female no distress HENT: oral mucosa pink/moist. Head: Normocephalic.  Eyes: EOM are normal. PERRL Neck: Normal range of motion. Neck supple. No thyromegaly present.  Lurline Idol site still a small air leak, wound pink and granulating Cardiovascular:  Cardiac rate control. No murmurs,  rubs, gallops, rhythm normal. Respiratory: Effort normal and breath  sounds normal. No respiratory distress. No wheezes GI: Soft. Bowel sounds are normal. She exhibits no distension.  Obese. Ostomy intact with hard stool. PEG site normal.  Neurological: She is alert and oriented to person, place, and time.  Patient makes good eye contact with examiner. She is oriented to person place and time. She did mild delay in providing appropriate date of birth. UES 4+/5 delt, tricep, HI, wrist, LES: 2/5 RHF, 2+ to 3/5 LHF, KE 3 to 3+ and ankles 3+ to 4/5.   Skin:  Right knee with healed TKA scar  Results for orders placed during the hospital encounter of 10/25/13 (from the past 48 hour(s))   CBC Status: Abnormal    Collection Time    11/20/13 5:00 AM   Result  Value  Ref Range    WBC  5.6  4.0 - 10.5 K/uL    RBC  2.90 (*)  3.87 - 5.11 MIL/uL    Hemoglobin  8.4 (*)  12.0 - 15.0 g/dL    HCT  26.1 (*)  36.0 - 46.0 %    MCV  90.0  78.0 - 100.0 fL    MCH  29.0  26.0 - 34.0 pg    MCHC  32.2  30.0 - 36.0 g/dL    RDW  16.3 (*)  11.5 - 15.5 %    Platelets  157  150 - 400 K/uL   BASIC METABOLIC PANEL Status: Abnormal    Collection Time    11/20/13 5:00 AM   Result  Value  Ref Range    Sodium  141  137 - 147 mEq/L    Potassium  4.6  3.7 - 5.3 mEq/L    Chloride  100  96 - 112 mEq/L    CO2  25  19 - 32 mEq/L    Glucose, Bld  88  70 - 99 mg/dL    BUN  72 (*)  6 - 23 mg/dL    Creatinine, Ser  2.51 (*)  0.50 - 1.10 mg/dL    Calcium  9.5  8.4 - 10.5 mg/dL    GFR calc non Af Amer  19 (*)  >90 mL/min    GFR calc Af Amer  22 (*)  >90 mL/min    Comment:  (NOTE)     The eGFR has been calculated using the CKD EPI equation.     This calculation has not been validated in all clinical situations.     eGFR's persistently <90 mL/min signify possible Chronic Kidney     Disease.    No results found.  Medical Problem List and Plan:  1. Functional deficits secondary to deconditioning related to respiratory failure multi-medical as above  2. DVT Prophylaxis/Anticoagulation:  Subcutaneous heparin discontinued and chronic Coumadin resumed 11/22/2013 without bridging. . Check vascular study on admit. 3. Pain Management: Oxycodone immediate release 5 mg every 6 hours as needed  4. Mood: Clonazepam 0.5 mg 3 times a day. Team to provide positive reinforcement 5. Neuropsych: This patient is capable of making decisions on her own behalf.  6. 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 Developed respiratory stridor placed on prednisone taper x5 days. Check oxygen saturations every shift and monitory closely as we wean. 7. Dysphagia. Mechanical soft diet. Gastrostomy tube feeds on hold. Followup speech therapy on advancing diet  8. Diabetes mellitus. Glipizide 10 mg daily, Levemir 12 units each bedtime. Check blood sugars a.c. and at bedtime. Monitor closely while on prednisone. Titrate  regimen as needed. 9. Hypertension/atrial fibrillation. Lasix 40 mg daily, Norvasc 10 mg daily, Coreg 6.25 mg twice a day  10. Hypothyroidism. Tapazole 5 mg daily. Latest TSH level 2.230  11. Chronic anemia. Followup CBC up on admission as not checked recently.  12. Obstructive sleep apnea. Resume CPAP. Family to bring CPAP machine from home    Post Admission Physician Evaluation:  1. Functional deficits secondary to deconditioning after respiratory failure, perforated sigmoid colong. 2. Patient is admitted to receive collaborative, interdisciplinary care between the physiatrist, rehab nursing staff, and therapy team. 3. Patient's level of medical complexity and substantial therapy needs in context of that medical necessity cannot be provided at a lesser intensity of care such as a SNF. 4. Patient has experienced substantial functional loss from his/her baseline which was documented above under the "Functional History" and "Functional Status" headings. Judging by the patient's diagnosis, physical exam, and functional history, the patient has potential for functional progress which will result in  measurable gains while on inpatient rehab. These gains will be of substantial and practical use upon discharge in facilitating mobility and self-care at the household level. 5. Physiatrist will provide 24 hour management of medical needs as well as oversight of the therapy plan/treatment and provide guidance as appropriate regarding the interaction of the two. 6. 24 hour rehab nursing will assist with bladder management, bowel management/ostomy care, safety, skin/wound care, disease management, medication administration, pain management and patient education and help integrate therapy concepts, techniques,education, etc. 7. PT will assess and treat for/with: Lower extremity strength, range of motion, stamina, balance, functional mobility, safety, adaptive techniques and equipment, pain mgt, skin care in context of mobility, family education, egosupport. Goals are: mod I to supervision. 8. OT will assess and treat for/with: ADL's, functional mobility, safety, upper extremity strength, adaptive techniques and equipment, ostomy mgt with bathing/dressing, functional activity tolerance, pt/family education. Goals are: mod I to supervision. 9. SLP will assess and treat for/with: speech, swallowing, communication Goals are: mod I. 10. Case Management and Social Worker will assess and treat for psychological issues and discharge planning. 11. Team conference will be held weekly to assess progress toward goals and to determine barriers to discharge. 12. Patient will receive at least 3 hours of therapy per day at least 5 days per week. 13. ELOS: 11-14 dasy  14. Prognosis: excellent  Meredith Staggers, MD, [facility]   11/21/2013

## 2013-11-22 NOTE — Progress Notes (Signed)
ANTICOAGULATION CONSULT NOTE - Initial Consult  Pharmacy Consult for warfarin Indication: atrial fibrillation and history of DVT  Allergies  Allergen Reactions  . Peanut Butter Flavor   . Penicillins     Patient Measurements: Weight 113.9 kg on 11/21/13 Height 160 cm on 11/21/13  Labs:  Recent Labs  11/20/13 0500 11/22/13 0625  HGB 8.4*  --   HCT 26.1*  --   PLT 157  --   CREATININE 2.51* 3.05*   The CrCl is unknown because both a height and weight (above a minimum accepted value) are required for this calculation.   Medical History: Past Medical History  Diagnosis Date  . PAF (paroxysmal atrial fibrillation)   . Anemia of chronic disease   . HTN (hypertension)   . Morbid obesity   . Diabetes mellitus, type II   . Hyperthyroidism   . Hyperlipidemia   . CKD (chronic kidney disease), stage III   . DVT (deep venous thrombosis)    Assessment: 56 YOF with complicated hospital course now in rehab to resume chronic warfarin for atrial fibrillation and history of DVT. Note that patient has chronic anemia with a baseline hemoglobin of 8.4 to 8.9 per Dr. Riley Kill progress note. Unsure from notes how long patient has been off warfarin and INR baseline. Appears patient has been receiving subcutaneous heparin during hospital stay. Home dose per family was 5mg  po daily.   Note that patient is on methimazole which can decrease the levels of warfarin.  Goal of Therapy:  INR 2-3 Monitor platelets by anticoagulation protocol: Yes   Plan:  1. Warfarin 5mg  po x1 tonight. 2. Daily PT/INR while on therapy.   Link Snuffer, PharmD, BCPS Clinical Pharmacist [phone] 11/22/2013,5:52 PM

## 2013-11-22 NOTE — Progress Notes (Signed)
Patient received at 1640 [facility] . Patient alert and oriented x 4 with memory deficits noted . Patient and family oriented to room and call bell system . Patient and family verbalized understanding of admission process. Colostomy intact left  lower abdomen and peg intact to left upper abdomen . Continue with plan of care.           Sarah Tran

## 2013-11-23 ENCOUNTER — Inpatient Hospital Stay (HOSPITAL_COMMUNITY): Payer: Medicare Other

## 2013-11-23 ENCOUNTER — Inpatient Hospital Stay (HOSPITAL_COMMUNITY): Payer: Medicare Other | Admitting: Occupational Therapy

## 2013-11-23 DIAGNOSIS — R092 Respiratory arrest: Secondary | ICD-10-CM

## 2013-11-23 DIAGNOSIS — M7989 Other specified soft tissue disorders: Secondary | ICD-10-CM

## 2013-11-23 LAB — CBC WITH DIFFERENTIAL/PLATELET
BASOS PCT: 0 % (ref 0–1)
Basophils Absolute: 0 10*3/uL (ref 0.0–0.1)
EOS PCT: 2 % (ref 0–5)
Eosinophils Absolute: 0.1 10*3/uL (ref 0.0–0.7)
HCT: 27 % — ABNORMAL LOW (ref 36.0–46.0)
Hemoglobin: 8.7 g/dL — ABNORMAL LOW (ref 12.0–15.0)
Lymphocytes Relative: 20 % (ref 12–46)
Lymphs Abs: 1.1 10*3/uL (ref 0.7–4.0)
MCH: 29 pg (ref 26.0–34.0)
MCHC: 32.2 g/dL (ref 30.0–36.0)
MCV: 90 fL (ref 78.0–100.0)
MONO ABS: 0.6 10*3/uL (ref 0.1–1.0)
Monocytes Relative: 11 % (ref 3–12)
NEUTROS ABS: 3.5 10*3/uL (ref 1.7–7.7)
Neutrophils Relative %: 67 % (ref 43–77)
PLATELETS: 159 10*3/uL (ref 150–400)
RBC: 3 MIL/uL — ABNORMAL LOW (ref 3.87–5.11)
RDW: 16.6 % — AB (ref 11.5–15.5)
WBC: 5.3 10*3/uL (ref 4.0–10.5)

## 2013-11-23 LAB — COMPREHENSIVE METABOLIC PANEL
ALBUMIN: 2.6 g/dL — AB (ref 3.5–5.2)
ALT: 22 U/L (ref 0–35)
AST: 12 U/L (ref 0–37)
Alkaline Phosphatase: 97 U/L (ref 39–117)
BUN: 69 mg/dL — ABNORMAL HIGH (ref 6–23)
CO2: 27 mEq/L (ref 19–32)
CREATININE: 3.16 mg/dL — AB (ref 0.50–1.10)
Calcium: 9.8 mg/dL (ref 8.4–10.5)
Chloride: 100 mEq/L (ref 96–112)
GFR calc Af Amer: 17 mL/min — ABNORMAL LOW (ref >90)
GFR calc non Af Amer: 14 mL/min — ABNORMAL LOW (ref >90)
Glucose, Bld: 68 mg/dL — ABNORMAL LOW (ref 70–99)
Potassium: 4.6 mEq/L (ref 3.7–5.3)
SODIUM: 140 meq/L (ref 137–147)
TOTAL PROTEIN: 6.2 g/dL (ref 6.0–8.3)
Total Bilirubin: 0.5 mg/dL (ref 0.3–1.2)

## 2013-11-23 LAB — PROTIME-INR
INR: 1.04 (ref 0.00–1.49)
Prothrombin Time: 13.4 seconds (ref 11.6–15.2)

## 2013-11-23 MED ORDER — WARFARIN SODIUM 5 MG PO TABS
5.0000 mg | ORAL_TABLET | Freq: Once | ORAL | Status: AC
  Administered 2013-11-23: 5 mg via ORAL
  Filled 2013-11-23: qty 1

## 2013-11-23 NOTE — Progress Notes (Signed)
VASCULAR LAB PRELIMINARY  PRELIMINARY  PRELIMINARY  PRELIMINARY  Bilateral lower extremity venous duplex completed.    Preliminary report:  Technically difficult exam due to body habitus.  No obvious DVT noted in right leg.  In the left leg the mid thigh veins appear thickened and unable to get the distal femoral vein or popliteal vein to compress.  Unable to visualize the calf vein on left. Can not rule out DVT in left leg.  Vanna Scotland, RVT 11/23/2013, 2:03 PM

## 2013-11-23 NOTE — Progress Notes (Signed)
ANTICOAGULATION CONSULT NOTE - Follow Up Consult  Pharmacy Consult for couamdin Indication: atrial fibrillation  Allergies  Allergen Reactions  . Codeine Other (See Comments)    Pass out    . Peanut Butter Flavor Hives and Other (See Comments)    Lockjaw   . Penicillins Swelling    Patient Measurements:   Heparin Dosing Weight:   Vital Signs: Temp: 97.5 F (36.4 C) (05/13 0500) Temp src: Oral (05/13 0500) BP: 129/76 mmHg (05/13 0500) Pulse Rate: 75 (05/13 0500)  Labs:  Recent Labs  11/22/13 0625 11/22/13 1904 11/23/13 0513  HGB  --   --  8.7*  HCT  --   --  27.0*  PLT  --   --  159  LABPROT  --  13.1 13.4  INR  --  1.01 1.04  CREATININE 3.05*  --  3.16*    The CrCl is unknown because both a height and weight (above a minimum accepted value) are required for this calculation.   Medications:  Scheduled:  . atorvastatin  10 mg Oral q1800  . carvedilol  6.25 mg Oral BID WC  . clonazePAM  0.5 mg Oral BID  . famotidine  20 mg Oral BID  . furosemide  40 mg Oral Daily  . glipiZIDE  10 mg Oral QAC breakfast  . insulin detemir  12 Units Subcutaneous QHS  . lactobacillus  1 g Oral TID WC  . methimazole  5 mg Oral Daily  . nitroGLYCERIN  1 inch Topical 4 times per day  . potassium chloride  40 mEq Oral Daily  . predniSONE  15 mg Oral Q breakfast  . senna-docusate  1 tablet Oral BID  . sodium bicarbonate  1,300 mg Oral BID  . timolol  1 drop Both Eyes BID  . vitamin C  500 mg Oral BID  . Warfarin - Pharmacist Dosing Inpatient   Does not apply q1800  . zinc sulfate  220 mg Oral Daily   Infusions:    Assessment: 68 yo female with afib is currently on subthearpeutic coumadin.  Patient is on methimazole. Goal of Therapy:  INR 2-3 Monitor platelets by anticoagulation protocol: Yes   Plan:  1) Coumadin 5mg  po x1 2) INR in am  Tsz-Yin Theron Cumbie 11/23/2013,8:45 AM

## 2013-11-23 NOTE — Evaluation (Signed)
Physical Therapy Assessment and Plan  Patient Details  Name: Sarah Tran MRN: [id number] Date of Birth: [birth year]  PT Diagnosis: Abnormal posture, Difficulty walking, Dizziness and giddiness, Edema and Muscle weakness Rehab Potential: Good ELOS: 21-24 days   Today's Date: 11/23/2013 Time: 1300-1400 , 1500-1530  Time Calculation (min): 60 min, 30 min  Problem List:  Patient Active Problem List   Diagnosis Date Noted  . Physical deconditioning 11/22/2013  . Acute on chronic respiratory failure 10/26/2013  . 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 Toxic metabolic encephalopathy 74/25/9563  . Hypervolemia 10/26/2013  . Acute on chronic renal insufficiency 10/26/2013  . DM2 (diabetes mellitus, type 2) 10/26/2013  . 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 PAF (paroxysmal atrial fibrillation)   . Anemia of chronic disease   . HTN (hypertension)   . Morbid obesity   . Diabetes mellitus, type II   . Hyperthyroidism   . Hyperlipidemia   . CKD (chronic kidney disease), stage III   . DVT (deep venous thrombosis)    Past Surgical History: No past surgical history on file.  Assessment & Plan Clinical Impression: 68 year old right-handed morbidly obese African American female. Past medical history of hypertension, renal insufficiency, obstructive sleep apnea with CPAP, DVT/A. fib with chronic Coumadin. Hospital course patient admitted to [facility] 09/28/2013 for removal of a ileal polyp. Noted perioperative complications balloon catheter became dislodged. She required exploratory laparotomy and diverting colostomy as well as appendectomy secondary to perforated sigmoid colon. Her hospital was complicated by respiratory failure requiring intubation and eventually tracheostomy as well his gastrostomy tube for nutritional support. She remained in intensive care for 28 days. Patient developed sepsis cultures growing  gram-positive cocci in clusters maintained on broad-spectrum antibiotics. [facility] stabilized [facility] for further vent weaning and wound care. An echocardiogram is completed 10/28/2013 showing mild LVH with ejection fraction of 55-60%. 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 Hospital course complicated by acute renal insufficiency with creatinine 2.51, A. fib with RVR, question angioedema from Invanz reaction and wound dehiscence. Patient's tracheostomy tube was downsized and later be cannulated 11/18/2013. Patient did develop some respiratory stridor placed on a prednisone taper x5 days per pulmonary services. Patient is on a mechanical soft diet and tube feeds have been discontinued.. Patient with chronic anemia hemoglobin varying from 8.4-8.9. Subcutaneous heparin ongoing for DVT prophylaxis had been initiated and patient's chronic Coumadin for history of DVT atrial fibrillation resumed 11/22/2013.   Patient transferred to CIR on 11/22/2013 .   Patient currently requires total +2 assist with mobility secondary to muscle weakness and decreased cardiorespiratoy endurance.  Prior to hospitalization, patient was modified independent  with mobility and lived with Family (daughter and granddaughter live with pt) in a House home.  Home access is 4Stairs to enter (brother stated he will put in a ramp).  Patient will benefit from skilled PT intervention to maximize safe functional mobility, minimize fall risk and decrease caregiver burden for planned discharge home with 24 hour supervision.  Anticipate patient will benefit from follow up [place] at discharge.  PT - End of Session Activity Tolerance: Tolerates < 10 min activity with changes in vital signs; pt dizzy upon sitting up, standing up; closes eyes when fatigued Endurance Deficit:  Yes Endurance Deficit Description: DOE PT Assessment Rehab Potential: Good  PT Patient demonstrates impairments in the following area(s): Balance;Edema;Endurance;Motor;Safety;Sensory PT Transfers Functional Problem(s): Bed Mobility;Bed to Chair;Car;Furniture PT Locomotion Functional Problem(s): Ambulation;Wheelchair Mobility PT Plan PT Intensity: Minimum of 1-2 x/day ,45 to 90 minutes PT Frequency: 5 out of 7 days PT Duration Estimated Length of Stay: 21-24 days PT Treatment/Interventions: Ambulation/gait training;Balance/vestibular training;Cognitive remediation/compensation;Discharge planning;Community reintegration;DME/adaptive equipment instruction;Functional mobility training;Patient/family education;Neuromuscular re-education;Psychosocial support;Splinting/orthotics;Therapeutic Exercise;Therapeutic Activities;Stair training;UE/LE Strength taining/ROM;UE/LE Coordination activities;Wheelchair propulsion/positioning PT Transfers Anticipated Outcome(s): supervision PT Locomotion Anticipated Outcome(s): supervision gait x 50' controlled and home env, stairs TBD, w/c x 50' with supervision  PT Recommendation Follow Up Recommendations: Home health PT Patient destination: Home Equipment Recommended: Rolling walker with 5" wheels;Wheelchair cushion (measurements);Wheelchair (measurements);To be determined  Skilled Therapeutic Intervention Tx 1: repetitive rolling L><R with use of rail in flat bed, placement and removal of bed pain, and for hygiene and bed linen changes; scooting up in bed with mod assist to position LEs.  Pt unable to maintain R foot wt bearing to assist by pushing with RLE. 24" wide w/c unavailable for transfer and recliner felt to be too low for safe transfer.    Tx 2:Pt reluctant to try to get OOB, but agreed.  With [facility] raised, use of bed rail, supine> sit with max assist.  SBT to L to level height of w/c, with extra time, max assist of 1 person.  W/c propulsion using bil UES x  15' with mod assist.  Pt tends to veer R.  Pt DOE 3/4 with minimal movements throughout session, requiring seated rest breaks; O2 sags 98% on room air.  Pt agreeable to sitting up in w/c for now, to improve activity tolerance.  Quick release belt applied, and all needs left within reach.  Therapist notified RN that pt has low tolerance for sitting up and should go back to bed before dinner.  PT Evaluation Precautions/Restrictions Precautions Precautions: Fall Precaution Comments: Pt with colostomy, PEG, mid abdominal wound site, healing trach site with small air leak; previous R TKA Restrictions Weight Bearing Restrictions: No General Oxygen Therapy SpO2: 96 % O2 Device: None (Room air) Pain Pain Assessment Pain Assessment: No/denies pain Home Living/Prior Functioning Home Living Living Arrangements: Children;Other relatives Available Help at Discharge: Family;Available 24 hours/day Type of Home: House Home Access: Stairs to enter (brother stated he will put in a ramp) Entrance Stairs-Number of Steps: 4 Entrance Stairs-Rails: None (pt reported that she has a railing) Home Layout: One level  Lives With: Family (daughter and granddaughter live with pt) Prior Function Level of Independence: Independent with basic ADLs;Independent with homemaking with ambulation;Independent with gait  Able to Take Stairs?: Yes Driving: Yes Vision/Perception - no changes, per pt report    Cognition Overall Cognitive Status: Impaired/Different from baseline Arousal/Alertness: Awake/alert Orientation Level: Oriented to person;Disoriented to time;Disoriented to place Sensation Sensation Light Touch: Appears Intact Proprioception: Impaired by gross assessment;Not tested Additional Comments: pt unable to place LEs under her for sit> stand Coordination Gross Motor Movements are Fluid and Coordinated: No Fine Motor Movements are Fluid and Coordinated: No Coordination and Movement Description:  Movements are slow and labored due to extreme weakness. Heel Shin Test: NT Motor  Motor Motor: Motor impersistence;Motor apraxia Motor - Skilled Clinical Observations: Generalized motor weakness  Mobility Bed Mobility Bed Mobility: Rolling Right;Rolling Left;Right Sidelying to Sit;Sit to Sidelying Right Rolling Right: With rail;2: Max assist Rolling Right Details: Manual facilitation for placement;Verbal cues for technique Rolling Left: 2: Max assist;With rail Rolling Left Details: Manual facilitation for placement;Verbal cues for technique  Right Sidelying to Sit: 2: Max assist Right Sidelying to Sit Details: Tactile cues for placement;Verbal cues for technique Sit to Sidelying Right: 2: Max assist Sit to Sidelying Right Details: Verbal cues for sequencing Transfers Transfers: No (time constraints due to bed/clothing change) Locomotion  Ambulation Ambulation: No Gait Gait: No Stairs / Additional Locomotion Stairs: No Wheelchair Mobility Wheelchair Mobility: No (approp. size w/c not avail. at eval)  Trunk/Postural Assessment  Cervical Assessment Cervical Assessment: Exceptions to [facility] (forward head) Thoracic Assessment Thoracic Assessment: Exceptions to [facility] (kyphotic) Lumbar Assessment Lumbar Assessment: Exceptions to [facility] (posterior pelvic tilt) Postural Control Postural Control: Deficits on evaluation (limited due to weakness)  Balance Balance Balance Assessed: Yes Static Sitting Balance Static Sitting - Level of Assistance: 5: Stand by assistance Dynamic Sitting Balance Dynamic Sitting - Level of Assistance: 4: Min assist Static Standing Balance Static Standing - Level of Assistance: 1: +2 Total assist  Extremity Assessment  PA gave verbal ok for pt to get OOB.  Doppler studies not definitive for ruling out LLE DVT.     RLE Assessment RLE Assessment: Exceptions to [facility] (edema noted) RLE Strength RLE Overall Strength Comments: in L sidelying, hip flex 2-/5; in  sitting hip abd/add 2-/5 but able to accept some resistance, knee ext 3+/5, ankle DF 3-/5 LLE Assessment LLE Assessment: Exceptions to [facility] (edema noted) LLE Strength LLE Overall Strength Comments: in R sidelying, hip flex 2-/5, in sitting, hip abd/add 2-/5 but able to accept some resistance, knee ext 3+/5, ankle DF 3-/5  FIM:  FIM - Bed/Chair Transfer Bed/Chair Transfer Assistive Devices: HOB elevated Bed/Chair Transfer: 2: Supine > Sit: Max A (lifting assist/Pt. 25-49%);2: Sit > Supine: Max A (lifting assist/Pt. 25-49%);0: Activity did not occur;2: Chair or W/C > Bed: Max A (lift and lower assist);1: Two helpers (sit>< stand +2 assist; correct size w/c not avail during eval) FIM - Locomotion: Wheelchair Locomotion: Wheelchair: 1: Travels less than 50 ft with moderate assistance (Pt: 50 - 74%) FIM - Locomotion: Ambulation Locomotion: Ambulation: 0: Activity did not occur FIM - Locomotion: Stairs Locomotion: Stairs: 0: Activity did not occur   Refer to Care Plan for Long Term Goals  Recommendations for other services: None  Discharge Criteria: Patient will be discharged from PT if patient refuses treatment 3 consecutive times without medical reason, if treatment goals not met, if there is a change in medical status, if patient makes no progress towards goals or if patient is discharged from hospital.  The above assessment, treatment plan, treatment alternatives and goals were discussed and mutually agreed upon: by patient  Frederic Jericho 11/23/2013, 4:41 PM

## 2013-11-23 NOTE — Progress Notes (Signed)
Social Work Assessment and Plan Social Work Assessment and Plan  Patient Details  Name: Sarah Tran MRN: [id number] Date of Birth: [birth year]  Today's Date: 11/23/2013  Problem List:  Patient Active Problem List   Diagnosis Date Noted  . Physical deconditioning 11/22/2013  . Acute on chronic respiratory failure 10/26/2013  . 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 Toxic metabolic encephalopathy 10/26/2013  . Hypervolemia 10/26/2013  . Acute on chronic renal insufficiency 10/26/2013  . DM2 (diabetes mellitus, type 2) 10/26/2013  . 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 PAF (paroxysmal atrial fibrillation)   . Anemia of chronic disease   . HTN (hypertension)   . Morbid obesity   . Diabetes mellitus, type II   . Hyperthyroidism   . Hyperlipidemia   . CKD (chronic kidney disease), stage III   . DVT (deep venous thrombosis)    Past Surgical History: No past surgical history on file. Social History:  has no tobacco, alcohol, and drug history on file.  Family / Support Systems Marital Status: Divorced Patient Roles: Parent;Caregiver Children: Katrina-daughter  [phone]   Lupita Leash Palmer-daughter  [phone] Other Supports: Jasmine December daughter whom she lives with Anticipated Caregiver: Jasmine December and granddaughter whom stay with her Ability/Limitations of Caregiver: between all of them someone will be there with pt at discharge Caregiver Availability: 24/7 Family Dynamics: Close knit family they are three children and five grandchildren.  All are very close and make sure Momma is taken care of.  Pt's brother is here often and providing support.  Much family support and friends also.  Social History Preferred language: English Religion: Unknown Cultural Background: No issues Education: High School Read: Yes Write: Yes Employment Status: Retired Fish farm manager Issues: No  issues Guardian/Conservator: None-according to MD pt is capable of making her own decisions while here.  Will make sure daughter is also included in any decisions due to pt is slow to process and been through a lot.   Abuse/Neglect Physical Abuse: Denies Verbal Abuse: Denies Sexual Abuse: Denies Exploitation of patient/patient's resources: Denies Self-Neglect: Denies  Emotional Status Pt's affect, behavior adn adjustment status: Pt is motivated and wants to do well so she can go home soon.  Pt has always been able to take car eof herself until this and is the one who provides care to others.  She helped with her grandsons after school prior to this.  She is taking each day at a time and looks at this as her last stop before going home. Recent Psychosocial Issues: Other medical issues-long hospitalization Pyschiatric History: No history deferred depression screen due to tired from therapies and her wanting to rest.  Will monitor and have Neuro-psych see while here to assit with her coping after this long hospitalization.  Pt relies upon her faith to pull her thorugh difficult times. Substance Abuse History: No issues  Patient / Family Perceptions, Expectations & Goals Pt/Family understanding of illness & functional limitations: Pt and daughter can expalin her hospitalization and treatment plan.  Both feel she is on the upswing and just needs to get stronger here and prepare for home.  Pt states; " I'm still here for some reason.  God is not ready for me yet."  Daughter reports she is the leader in our family. Premorbid pt/family roles/activities: Mother, grandmother, retiree, HOme owner, Church member, etc Anticipated changes in roles/activities/participation: resume Pt/family expectations/goals: Pt states: " I want to do for myself, so  my children don't have to."  Daughter states": " We will do waht our Mother needs, we'll make sure she is cared for."  [employer] Community Agencies:  None Premorbid Home Care/DME Agencies: None Transportation available at discharge: Family Resource referrals recommended: Support group (specify)  Discharge Planning Living Arrangements: Children;Other relatives Support Systems: Children;Other relatives;Friends/neighbors;Church/faith community Type of Residence: Private residence Insurance Resources: [employer]are Financial Resources: Social Security Financial Screen Referred: Yes Living Expenses: Own Money Management: Patient Does the patient have any problems obtaining your medications?: No Home Management: Patient and daughter Patient/Family Preliminary Plans: Return home with daughter and granddaughter assisting, both live with here.  There are other family memebrs close by-another daughter is next door and involved.  Between all of them they will make sure pt is taken care of.  Encouraged them to attend therapies with her wehn here,. Social Work Anticipated Follow Up Needs: HH/OP;Support Group  Clinical Impression Pleasant unfortunate patient that had many complications from surgery and was transferred to [facility] and now she is on rehab.  Very supportive family who are involved and will provide care at home. Will need to see if eligible for Medicaid due to into her co-pay of Medicare days and has no supplemental insurance.  Family made aware she will need 24 hr care at discharge from here.  Will work on A safe discharge plan and assist with pt's coping.   Lemar LivingsRebecca G Pearce [place] 11/23/2013, 3:37 PM

## 2013-11-23 NOTE — Progress Notes (Signed)
Subjective/Complaints: 68 year old right-handed morbidly obese African American female. Past medical history of hypertension, renal insufficiency, obstructive sleep apnea with CPAP, DVT/A. fib with chronic Coumadin. Hospital course patient admitted to [facility] 09/28/2013 for removal of a ileal polyp. Noted perioperative complications balloon catheter became dislodged. She required exploratory laparotomy and diverting colostomy as well as appendectomy secondary to perforated sigmoid colon. Her hospital was complicated by respiratory failure requiring intubation and eventually tracheostomy as well his gastrostomy tube for nutritional support. She remained in intensive care for 28 days. Patient developed sepsis cultures growing gram-positive cocci in clusters maintained on broad-spectrum antibiotics. [facility] stabilized [facility] for further vent weaning and wound care. An echocardiogram is completed 10/28/2013 showing mild LVH with ejection fraction of 55-60%. 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 Hospital course complicated by acute renal insufficiency with creatinine 2.51, A. fib with RVR, question angioedema from Invanz reaction and wound dehiscence. Patient's tracheostomy tube was downsized and later be cannulated 11/18/2013. Patient did develop some respiratory stridor placed on a prednisone taper x5 days   Review of Systems - difficult to obtain secondary to disorientation and delayed response latency Objective: Vital Signs: Blood pressure 129/76, pulse 75, temperature 97.5 F (36.4 C), temperature source Oral, resp. rate 16, SpO2 98.00%. No results found. Results for orders placed during the hospital encounter of 11/22/13 (from the past 72 hour(s))  PROTIME-INR     Status: None   Collection Time    11/22/13  7:04  PM      Result Value Ref Range   Prothrombin Time 13.1  11.6 - 15.2 seconds   INR 1.01  0.00 - 1.49  CBC WITH DIFFERENTIAL     Status: Abnormal   Collection Time    11/23/13  5:13 AM      Result Value Ref Range   WBC 5.3  4.0 - 10.5 K/uL   RBC 3.00 (*) 3.87 - 5.11 MIL/uL   Hemoglobin 8.7 (*) 12.0 - 15.0 g/dL   HCT 27.0 (*) 36.0 - 46.0 %   MCV 90.0  78.0 - 100.0 fL   MCH 29.0  26.0 - 34.0 pg   MCHC 32.2  30.0 - 36.0 g/dL   RDW 16.6 (*) 11.5 - 15.5 %   Platelets 159  150 - 400 K/uL   Neutrophils Relative % 67  43 - 77 %   Neutro Abs 3.5  1.7 - 7.7 K/uL   Lymphocytes Relative 20  12 - 46 %   Lymphs Abs 1.1  0.7 - 4.0 K/uL   Monocytes Relative 11  3 - 12 %   Monocytes Absolute 0.6  0.1 - 1.0 K/uL   Eosinophils Relative 2  0 - 5 %   Eosinophils Absolute 0.1  0.0 - 0.7 K/uL   Basophils Relative 0  0 - 1 %   Basophils Absolute 0.0  0.0 - 0.1 K/uL  COMPREHENSIVE METABOLIC PANEL     Status: Abnormal   Collection Time    11/23/13  5:13 AM      Result Value Ref Range   Sodium 140  137 - 147 mEq/L   Potassium 4.6  3.7 - 5.3 mEq/L   Chloride 100  96 - 112 mEq/L   CO2 27  19 - 32 mEq/L   Glucose, Bld 68 (*) 70 - 99 mg/dL   BUN 69 (*) 6 -  23 mg/dL   Creatinine, Ser 3.16 (*) 0.50 - 1.10 mg/dL   Calcium 9.8  8.4 - 10.5 mg/dL   Total Protein 6.2  6.0 - 8.3 g/dL   Albumin 2.6 (*) 3.5 - 5.2 g/dL   AST 12  0 - 37 U/L   ALT 22  0 - 35 U/L   Alkaline Phosphatase 97  39 - 117 U/L   Total Bilirubin 0.5  0.3 - 1.2 mg/dL   GFR calc non Af Amer 14 (*) >90 mL/min   GFR calc Af Amer 17 (*) >90 mL/min   Comment: (NOTE)     The eGFR has been calculated using the CKD EPI equation.     This calculation has not been validated in all clinical situations.     eGFR's persistently <90 mL/min signify possible Chronic Kidney     Disease.  PROTIME-INR     Status: None   Collection Time    11/23/13  5:13 AM      Result Value Ref Range   Prothrombin Time 13.4  11.6 - 15.2 seconds   INR 1.04  0.00 - 1.49      HEENT: healing trach site with air leak  General: No acute distress Mood and affect are appropriate Heart: Regular rate and rhythm no rubs murmurs or extra sounds Lungs: Clear to auscultation, breathing unlabored, no rales or wheezes Abdomen: Positive bowel sounds, soft nontender to palpation, nondistended.  LLQ colostomy 3cm midline vertical wound Extremities: No clubbing, cyanosis, or edema Skin: No evidence of breakdown, no evidence of rash Neurologic: Cranial nerves II through XII intact, motor strength is 4/5 in bilateral deltoid, bicep, tricep, grip, hip flexor, knee extensors, ankle dorsiflexor and plantar flexor  Cerebellar exam normal finger to nose to finger on right, mild/mod dysmetria on left  Musculoskeletal:no pain with  Active range of motion in all 4 extremities. No joint swelling  Assessment/Plan: 1. Functional deficits secondary to Deconditioning and encephalopathy which require 3+ hours per day of interdisciplinary therapy in a comprehensive inpatient rehab setting. Physiatrist is providing close team supervision and 24 hour management of active medical problems listed below. Physiatrist and rehab team continue to assess barriers to discharge/monitor patient progress toward functional and medical goals. FIM:       FIM - Toileting Toileting: 1: Total-Patient completed zero steps, helper did all 3  FIM - Radio producer Devices: Bedside commode Toilet Transfers: 1-Mechanical lift;1-Two helpers  FIM - IT sales professional Transfer: 1: Mechanical lift;1: Two helpers     Comprehension Comprehension Mode: Auditory;Visual Comprehension: 5-Understands basic 90% of the time/requires cueing < 10% of the time  Expression Expression Mode: Verbal Expression: 5-Expresses basic 90% of the time/requires cueing < 10% of the time.  Social Interaction Social Interaction: 5-Interacts appropriately 90% of the time - Needs monitoring  or encouragement for participation or interaction.  Problem Solving Problem Solving: 4-Solves basic 75 - 89% of the time/requires cueing 10 - 24% of the time  Memory Memory: 5-Recognizes or recalls 90% of the time/requires cueing < 10% of the time   Medical Problem List and Plan:  1. Functional deficits secondary to deconditioning related to respiratory failure multi-medical as above  2. DVT Prophylaxis/Anticoagulation: Subcutaneous heparin discontinued and chronic Coumadin resumed 11/22/2013 without bridging. . Check vascular study on admit.  3. Pain Management: Oxycodone immediate release 5 mg every 6 hours as needed  4. Mood: Clonazepam 0.5 mg 3 times a day. Team to provide positive reinforcement  5. Neuropsych:  This patient is capable of making decisions on her own behalf.  6. 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 Developed respiratory stridor placed on prednisone taper x5 days. Check oxygen saturations every shift and monitory closely as we wean.  7. Dysphagia. Mechanical soft diet. Gastrostomy tube feeds on hold. Followup speech therapy on advancing diet  8. Diabetes mellitus. Glipizide 10 mg daily, Levemir 12 units each bedtime. Check blood sugars a.c. and at bedtime. Monitor closely while on prednisone. Titrate regimen as needed.  9. Hypertension/atrial fibrillation. Lasix 40 mg daily, Norvasc 10 mg daily, Coreg 6.25 mg twice a day  10. Hypothyroidism. Tapazole 5 mg daily. Latest TSH level 2.230  11. Chronic anemia. Followup CBC up on admission as not checked recently.  12. Obstructive sleep apnea. Resume CPAP. Family to bring CPAP machine from home   LOS (Days) 1 A FACE TO FACE EVALUATION WAS PERFORMED  Charlett Blake 11/23/2013, 7:07 AM

## 2013-11-23 NOTE — Consult Note (Signed)
WOC wound consult note Reason for Consult: evaluate midline surgical wound for replacement of NPWT VAC dressing, ostomy care and teaching. Pt from LTAC where she has had VAC dressing to her midline wound. She is noted to have LLQ colostomy also.  Wound type:surgical   Measurement: 4cm x 1.5cm x 1.0cm  Wound UVJ:DYNX, non granular, moist Drainage (amount, consistency, odor) moderate, yellow, nonpurulent  Periwound:intact Dressing procedure/placement/frequency: since the wound is not small and fairly superficial will not restart VAC dressings to limit devices attached to her for her to participate in rehab most effectively. Silver hydrofiber for potential bioburdan, change M/W/F, top with dry dressing.  WOC ostomy consult note Stoma type/location: LLQ, end colostomy Output hard, brown stool in pouch and at stoma os, attempted to move to assess stoma. Appears pink  Ostomy pouching: 1pc.in place from her admission to rehab. Supplies ordered to begin teaching care of her ostomy to the patient and the family.    Dr. Wynn Banker the bedside to discuss the patients care.   WOC will will follow along with you for ostomy teaching and care.   Raymond Bhardwaj Stuart RN,CWOCN [phone]

## 2013-11-23 NOTE — Evaluation (Signed)
Occupational Therapy Assessment and Plan  Patient Details  Name: Sarah Tran MRN: [id number] Date of Birth: [birth year]  OT Diagnosis: abnormal posture, apraxia, cognitive deficits, muscle weakness (generalized) and swelling of limb Rehab Potential: Rehab Potential: Good ELOS: 20-24 days   Today's Date: 11/23/2013 Time: 1010-1140 Time Calculation (min): 90 min  Problem List:  Patient Active Problem List   Diagnosis Date Noted  . Physical deconditioning 11/22/2013  . Acute on chronic respiratory failure 10/26/2013  . 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 Toxic metabolic encephalopathy 32/95/1884  . Hypervolemia 10/26/2013  . Acute on chronic renal insufficiency 10/26/2013  . DM2 (diabetes mellitus, type 2) 10/26/2013  . 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 PAF (paroxysmal atrial fibrillation)   . Anemia of chronic disease   . HTN (hypertension)   . Morbid obesity   . Diabetes mellitus, type II   . Hyperthyroidism   . Hyperlipidemia   . CKD (chronic kidney disease), stage III   . DVT (deep venous thrombosis)    Past Surgical History: No past surgical history on file.  Assessment & Plan Clinical Impression:  68 year old right-handed morbidly obese African American female. Past medical history of hypertension, renal insufficiency, obstructive sleep apnea with CPAP, DVT/A. fib with chronic Coumadin. Hospital course patient admitted to [facility] 09/28/2013 for removal of a ileal polyp. Noted perioperative complications balloon catheter became dislodged. She required exploratory laparotomy and diverting colostomy as well as appendectomy secondary to perforated sigmoid colon. Her hospital was complicated by respiratory failure requiring intubation and eventually tracheostomy as well his gastrostomy tube for nutritional support. She remained in intensive care for 28 days. Patient developed sepsis  cultures growing gram-positive cocci in clusters maintained on broad-spectrum antibiotics. [facility] further vent weaning and wound care. An echocardiogram is completed 10/28/2013 showing mild LVH with ejection fraction of 55-60%. 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 Hospital course complicated by acute renal insufficiency with creatinine 2.51, A. fib with RVR, question angioedema from Invanz reaction and wound dehiscence. Patient's tracheostomy tube was downsized and later be cannulated 11/18/2013. Patient did develop some respiratory stridor placed on a prednisone taper x5 days per pulmonary services. Patient is on a mechanical soft diet and tube feeds have been discontinued.. Patient with chronic anemia hemoglobin varying from 8.4-8.9. Subcutaneous heparin ongoing for DVT prophylaxis had been initiated and patient's chronic Coumadin for history of DVT atrial fibrillation resumed 11/22/2013. Patient with profound deconditioning and was admitted for comprehensive rehabilitation program  Patient transferred to CIR on 11/22/2013 .    Patient currently requires total with basic self-care skills secondary to muscle weakness, decreased cardiorespiratoy endurance, decreased motor planning, decreased initiation, decreased awareness, decreased problem solving, decreased safety awareness, decreased memory and delayed processing and decreased sitting balance, decreased standing balance and decreased postural control.  Prior to hospitalization, patient was independent with ADLs, cooking, and driving.  Patient will benefit from skilled intervention to increase independence with basic self-care skills prior to discharge home with care partner.  Anticipate patient will require minimal physical assistance and follow up home  health.  OT - End of Session Activity Tolerance: Tolerates < 10 min activity, no significant change in vital signs Endurance Deficit: Yes Endurance Deficit Description: Pt becomes SOB and nauseated with activity. Vomitted one time during  session. OT Assessment Rehab Potential: Good OT Patient demonstrates impairments in the following area(s): Balance;Cognition;Endurance;Edema;Motor;Pain;Perception;Safety OT Basic ADL's Functional Problem(s): Grooming;Bathing;Dressing;Toileting OT Transfers Functional Problem(s): Toilet;Tub/Shower OT Additional Impairment(s): Fuctional Use of Upper Extremity OT Plan OT Intensity: Minimum of 1-2 x/day, 45 to 90 minutes OT Frequency: 5 out of 7 days OT Duration/Estimated Length of Stay: 20-24 days OT Treatment/Interventions: Balance/vestibular training;Cognitive remediation/compensation;Discharge planning;DME/adaptive equipment instruction;Functional mobility training;Patient/family education;Psychosocial support;Self Care/advanced ADL retraining;Therapeutic Activities;Therapeutic Exercise;UE/LE Strength taining/ROM;UE/LE Coordination activities OT Self Feeding Anticipated Outcome(s): set up OT Basic Self-Care Anticipated Outcome(s): set up grooming, min A bathing, dressing  OT Toileting Anticipated Outcome(s): min A OT Bathroom Transfers Anticipated Outcome(s): min A OT Recommendation Patient destination: Home Follow Up Recommendations: Home health OT Equipment Recommended: 3 in 1 bedside comode;Tub/shower bench   Skilled Therapeutic Intervention Pt seen for initial evaluation and ADL retraining of B/D at bedside with a focus on activity tolerance and initiation.  Pt and brother were consulted on the purpose of OT, goals, and POC.  Pt sat to EOB with mod to max A and tolerated sitting on EOB for 40 minutes.  Her processing skills were quite delayed and she was not able to answer basic questions about her history.  She would constantly state that she needed  to slow down and take a break, even though she was moving at an extremely slow pace.  She did need to vomit and stated she needed to use the [facility].  A tech was called in to assist with the transfer.  Attempted to have pt stand with Bariatric RW at EOB, pt was not able to push up and her feet continually slid forward even with non slip socks.  Scooting transfers were attempted but it was very difficult as pt was only able to attend to one direction at a time. She could not maintain a forward lean and focus on scooting.  Attempted a squat pivot with total A x 2 but BSC (it had a sufficient weigh capacity) began to slide and it was too high for pt so she was not able to scoot back and it was too difficult to move her back. Pt transferred back to bed to use bed pan.  Pt rolled to L with mod A for placement of plan, During clean up her ostomy bag started leaking everywhere due to an opening in the bag. Nursing called in to change bag.   A wide drop arm BSC was brought into the room for nursing to use next time with a Clarise Cruz lift. Pt has Ted hose ordered, but the correct size was not on the unit. Unit secretary ordered the correct size.   Pt left in room with nursing staff taking over care.  OT Evaluation Precautions/Restrictions  Precautions Precautions: Fall Precaution Comments: Pt with colostomy, PEG, mid abdominal wound site, healing trach site with small air leak Restrictions Weight Bearing Restrictions: No      Pain Pain Assessment Pain Assessment: No/denies pain Home Living/Prior Functioning Home Living Family/patient expects to be discharged to:: Private residence Living Arrangements: Children Available Help at Discharge: Family;Available 24 hours/day Type of Home: House Home Access: Stairs to enter [employer] of Steps: 4 Entrance Stairs-Rails: None Home Layout: One level  Lives With: Family (daughter and granddaughter live with pt) Prior Function Level of Independence:  Independent with basic ADLs;Independent with homemaking with ambulation;Independent with gait  Able to Take Stairs?: Yes Driving: Yes Vocation:  (not currently working, unable to state if she is retired or what she  used to do) ADL ADL ADL Comments: Refer to FIM Vision/Perception  Vision- History Patient Visual Report: No change from baseline Vision- Assessment Vision Assessment?:  (To be assessed)  Cognition Overall Cognitive Status: Impaired/Different from baseline Arousal/Alertness: Awake/alert Orientation Level: Oriented to person;Disoriented to place;Disoriented to time;Disoriented to situation Attention: Sustained Sustained Attention: Impaired Sustained Attention Impairment: Functional basic;Verbal basic Memory: Impaired Memory Impairment: Decreased recall of new information Awareness: Impaired Awareness Impairment: Intellectual impairment;Emergent impairment;Anticipatory impairment Problem Solving: Impaired Problem Solving Impairment: Functional basic Executive Function:  (all impaired due to lower level deficits) Safety/Judgment: Impaired Comments: brother requested all 4 side rails be put up on the bed because pt will try to get up by herself Sensation Sensation Light Touch: Appears Intact Stereognosis: Appears Intact Hot/Cold: Appears Intact Proprioception: Impaired by gross assessment Additional Comments: Pt needed max A to find safe placement of her feet during attempted sit to stand and transfer. Coordination Gross Motor Movements are Fluid and Coordinated: No Fine Motor Movements are Fluid and Coordinated: No Coordination and Movement Description: Movements are slow and labored due to extreme weakness. Finger Nose Finger Test: Not tested Motor  Motor Motor: Motor apraxia Motor - Skilled Clinical Observations: Generalized motor weakness Mobility    Refer to FIM Trunk/Postural Assessment  Cervical Assessment Cervical Assessment: Exceptions to [facility] (forward  head flexion) Thoracic Assessment Thoracic Assessment: Exceptions to [facility] (kyphotic posture) Lumbar Assessment Lumbar Assessment: Exceptions to [facility] (posterior pelvic tilt) Postural Control Postural Control: Deficits on evaluation (limited due to weakness)  Balance Static Sitting Balance Static Sitting - Level of Assistance: 5: Stand by assistance Dynamic Sitting Balance Dynamic Sitting - Level of Assistance: 4: Min assist Static Standing Balance Static Standing - Level of Assistance: 1: +2 Total assist Extremity/Trunk Assessment RUE Assessment RUE Assessment: Exceptions to [facility] RUE AROM (degrees) Right Shoulder Flexion: 20 Degrees RUE PROM (degrees) RUE Overall PROM Comments: WFL RUE Strength Right Shoulder Flexion: 2-/5 Right Elbow Flexion: 3/5 Gross Grasp: Impaired (3+/5) LUE Assessment LUE Assessment: Exceptions to WFL LUE AROM (degrees) Left Shoulder Flexion: 20 Degrees LUE PROM (degrees) LUE Overall PROM Comments: WFL LUE Strength Left Shoulder Flexion: 2-/5 Left Elbow Flexion: 3/5 Gross Grasp: Impaired (3+/5)  FIM:  FIM - Eating Eating Activity: 5: Set-up assist for open containers;5: Supervision/cues FIM - Grooming Grooming Steps: Wash, rinse, dry face;Oral care, brush teeth, clean dentures Grooming: 4: Patient completes 3 of 4 or 4 of 5 steps FIM - Bathing Bathing Steps Patient Completed: Chest;Right Arm;Left Arm Bathing: 2: Max-Patient completes 3-4 [id number] parts or 25-49% FIM - Upper Body Dressing/Undressing Upper body dressing/undressing steps patient completed: Thread/unthread right sleeve of pullover shirt/dresss;Thread/unthread left sleeve of pullover shirt/dress Upper body dressing/undressing: 3: Mod-Patient completed 50-74% of tasks FIM - Lower Body Dressing/Undressing Lower body dressing/undressing: 1: Two helpers FIM - BIT sales professionalTransfer: 2: Supine > Sit: Max A (lifting assist/Pt. 25-49%) FIM - Tub/Shower Transfers Tub/shower  Transfers: 0-Activity did not occur or was simulated   Refer to Care Plan for Long Term Goals  Recommendations for other services: None  Discharge Criteria: Patient will be discharged from OT if patient refuses treatment 3 consecutive times without medical reason, if treatment goals not met, if there is a change in medical status, if patient makes no progress towards goals or if patient is discharged from hospital.  The above assessment, treatment plan, treatment alternatives and goals were discussed and mutually agreed upon: by patient and by family  JHarlene Ramus5/13/2015, 12:23 PM

## 2013-11-23 NOTE — Discharge Instructions (Addendum)
Information on my medicine - Coumadin®   (Warfarin) ° °This medication education was reviewed with me or my healthcare representative as part of my discharge preparation.  The pharmacist that spoke with me during my hospital stay was:  Jeremy So, PharmD ° °Why was Coumadin prescribed for you? °Coumadin was prescribed for you because you have a blood clot or a medical condition that can cause an increased risk of forming blood clots. Blood clots can cause serious health problems by blocking the flow of blood to the heart, lung, or brain. Coumadin can prevent harmful blood clots from forming. °As a reminder your indication for Coumadin is:   Stroke Prevention Because Of Atrial Fibrillation ° °What test will check on my response to Coumadin? °While on Coumadin (warfarin) you will need to have an INR test regularly to ensure that your dose is keeping you in the desired range. The INR (international normalized ratio) number is calculated from the result of the laboratory test called prothrombin time (PT). ° °If an INR APPOINTMENT HAS NOT ALREADY BEEN MADE FOR YOU please schedule an appointment to have this lab work done by your health care provider within 7 days. °Your INR goal is usually a number between:  2 to 3 or your provider may give you a more narrow range like 2-2.5.  Ask your health care provider during an office visit what your goal INR is. ° °What  do you need to  know  About  COUMADIN? °Take Coumadin (warfarin) exactly as prescribed by your healthcare provider about the same time each day.  DO NOT stop taking without talking to the doctor who prescribed the medication.  Stopping without other blood clot prevention medication to take the place of Coumadin may increase your risk of developing a new clot or stroke.  Get refills before you run out. ° °What do you do if you miss a dose? °If you miss a dose, take it as soon as you remember on the same day then continue your regularly scheduled regimen the next day.   Do not take two doses of Coumadin at the same time. ° °Important Safety Information °A possible side effect of Coumadin (Warfarin) is an increased risk of bleeding. You should call your healthcare provider right away if you experience any of the following: °? Bleeding from an injury or your nose that does not stop. °? Unusual colored urine (red or dark brown) or unusual colored stools (red or black). °? Unusual bruising for unknown reasons. °? A serious fall or if you hit your head (even if there is no bleeding). ° °Some foods or medicines interact with Coumadin® (warfarin) and might alter your response to warfarin. To help avoid this: °? Eat a balanced diet, maintaining a consistent amount of Vitamin K. °? Notify your provider about major diet changes you plan to make. °? Avoid alcohol or limit your intake to 1 drink for women and 2 drinks for men per day. °(1 drink is 5 oz. wine, 12 oz. beer, or 1.5 oz. liquor.) ° °Make sure that ANY health care provider who prescribes medication for you knows that you are taking Coumadin (warfarin).  Also make sure the healthcare provider who is monitoring your Coumadin knows when you have started a new medication including herbals and non-prescription products. ° °Coumadin® (Warfarin)  Major Drug Interactions  °Increased Warfarin Effect Decreased Warfarin Effect  °Alcohol (large quantities) °Antibiotics (esp. Septra/Bactrim, Flagyl, Cipro) °Amiodarone (Cordarone) °Aspirin (ASA) °Cimetidine (Tagamet) °Megestrol (Megace) °NSAIDs (ibuprofen, naproxen,   Cimetidine (Tagamet) Megestrol (Megace) NSAIDs (ibuprofen, naproxen, etc.) Piroxicam (Feldene) Propafenone (Rythmol SR) Propranolol (Inderal) Isoniazid (INH) Posaconazole (Noxafil) Barbiturates (Phenobarbital) Carbamazepine (Tegretol) Chlordiazepoxide (Librium) Cholestyramine (Questran) Griseofulvin Oral Contraceptives Rifampin Sucralfate (Carafate) Vitamin K   Coumadin (Warfarin) Major Herbal Interactions  Increased Warfarin Effect Decreased Warfarin Effect  Garlic Ginseng Ginkgo  biloba Coenzyme Q10 Green tea St. Johns wort    Coumadin (Warfarin) FOOD Interactions  Eat a consistent number of servings per week of foods HIGH in Vitamin K (1 serving =  cup)  Collards (cooked, or boiled & drained) Kale (cooked, or boiled & drained) Mustard greens (cooked, or boiled & drained) Parsley *serving size only =  cup Spinach (cooked, or boiled & drained) Swiss chard (cooked, or boiled & drained) Turnip greens (cooked, or boiled & drained)  Eat a consistent number of servings per week of foods MEDIUM-HIGH in Vitamin K (1 serving = 1 cup)  Asparagus (cooked, or boiled & drained) Broccoli (cooked, boiled & drained, or raw & chopped) Brussel sprouts (cooked, or boiled & drained) *serving size only =  cup Lettuce, raw (green leaf, endive, romaine) Spinach, raw Turnip greens, raw & chopped   These websites have more information on Coumadin (warfarin):  http://www.king-russell.com/; https://www.hines.net/;   Inpatient Rehab Discharge Instructions  Sarah Tran Discharge date and time: No discharge date for patient encounter.   Activities/Precautions/ Functional Status: Activity: activity as tolerated Diet: diabetic diet Wound Care: none needed Functional status:  ___ No restrictions     ___ Walk up steps independently _x__ 24/7 supervision/assistance   ___ Walk up steps with assistance ___ Intermittent supervision/assistance  ___ Bathe/dress independently ___ Walk with walker     ___ Bathe/dress with assistance ___ Walk Independently    ___ Shower independently _x__ Walk with assistance    ___ Shower with assistance ___ No alcohol     ___ Return to work/school ________  Special Instructions:  Home health nurse to check INR 12/15/2013 results to Dr. Frances Furbish [phone] fax #[phone]   Routine colostomy care  Follow up general surgeon for removal of peg tube once INR less than 2.00.   COMMUNITY REFERRALS UPON DISCHARGE:    Home Health:   PT,  OT, RN, SPT  Agency:[facility] Phone:[phone]   Date of last service:12/12/2013    Medical Equipment/Items Ordered:WIDE Levan Hurst, TUB BENCH, [facility]  Agency/Supplier:[facility]    [phone]    My questions have been answered and I understand these instructions. I will adhere to these goals and the provided educational materials after my discharge from the hospital.  Patient/Caregiver Signature _______________________________ Date __________  Clinician Signature _______________________________________ Date __________  Please bring this form and your medication list with you to all your follow-up doctor's appointments.

## 2013-11-23 NOTE — Care Management Note (Signed)
Inpatient Rehabilitation Center Individual Statement of Services  Patient Name:  Sarah Tran  Date:  11/23/2013  Welcome to the Inpatient Rehabilitation Center.  Our goal is to provide you with an individualized program based on your diagnosis and situation, designed to meet your specific needs.  With this comprehensive rehabilitation program, you will be expected to participate in at least 3 hours of rehabilitation therapies Monday-Friday, with modified therapy programming on the weekends.  Your rehabilitation program will include the following services:  Physical Therapy (PT), Occupational Therapy (OT), Speech Therapy (ST), 24 hour per day rehabilitation nursing, Neuropsychology, Case Management (Social Worker), Rehabilitation Medicine, Nutrition Services and Pharmacy Services  Weekly team conferences will be held on Wednesday to discuss your progress.  Your Social Worker will talk with you frequently to get your input and to update you on team discussions.  Team conferences with you and your family in attendance may also be held.  Expected length of stay: 20-24 days  Overall anticipated outcome: supervision/min level  Depending on your progress and recovery, your program may change. Your Social Worker will coordinate services and will keep you informed of any changes. Your Social Worker's name and contact numbers are listed  below.  The following services may also be recommended but are not provided by the Inpatient Rehabilitation Center:   Driving Evaluations  Home Health Rehabiltiation Services  Outpatient Rehabilitation Services    Arrangements will be made to provide these services after discharge if needed.  Arrangements include referral to agencies that provide these services.  Your insurance has been verified to be:  Medicare Your primary doctor is:  Dr Frankey Shown  Pertinent information will be shared with your doctor and your insurance company.  Social Worker:  Dossie Der, SW [phone] or ([phone]  Information discussed with and copy given to patient by: Lucy Chris, 11/23/2013, 2:35 PM

## 2013-11-23 NOTE — Progress Notes (Signed)
[facility] PMR Admission Coordinator Pre-Admission Assessment   Patient: Sarah Tran is an 68 y.o., female MRN: [id number] DOB: 07-Mar-1946 Height: 5' 3"  (160 cm) Weight: 113.853 kg (251 lb)   Insurance Information HMO:     PPO:      PCP:      IPA:      80/20:      OTHER:   PRIMARY: Medicare A & B      Policy#: [id number]      Subscriber: self CM Name:       Phone#:      Fax#:   Pre-Cert#: verified in Visual merchandiser: retired Benefits:  Phone #:      Name:   Eff. Date: A & B: 04-13-06     Deduct: $1260      Out of Pocket Max: none      Life Max: unlimited CIR: 100%      SNF: 100% days 1-20; 80% days 21-100 (100 day visit max) Outpatient: 80%     Co-Pay: 20% Home Health: 100%      Co-Pay: none DME: 80%     Co-Pay: 20% Providers: pt's preference.   Note: pt has 5 full inpatient Medicare days left and 30 full inpatient copay days. Pt will be using some of her copay days.   Emergency Contact Information Contact Information     Name  Relation  Home  Work  Mobile     Casebeer,Katrina  Daughter  [phone]  [phone]       Emilio Aspen  Daughter      [phone]       Current Medical History     Patient Admitting Diagnosis: deconditioning with multiple medical issues (HTN, exploratory lap and diverting colostomy with appendectomy, respiratory failure following a polyp removal in March 2015)   History of Present Illness: Pt is a 68 year old right-handed morbidly obese African American female. Past medical history of hypertension, renal insufficiency, DVT with chronic Coumadin. Hospital course patient admitted to  [facility] 09/28/2013 for removal of a ileal polyp. Noted perioperative complications balloon catheter became dislodged. She required exploratory laparotomy and diverting colostomy as well as appendectomy secondary to perforated sigmoid colon. Her hospital was complicated by respiratory failure requiring intubation and eventually tracheostomy as well  his gastrostomy tube for nutritional support. She remained in intensive care for 28 days. Patient developed sepsis cultures growing gram-positive cocci in clusters maintained on broad-spectrum antibiotics. [facility] further vent weaning and wound care. An echocardiogram is completed 10/28/2013 showing mild LVH with ejection fraction of 55-60%. 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 Hospital course complicated by acute renal insufficiency with creatinine 2.51, A. fib with RVR, question angioedema from Invanz reaction and wound dehiscence. Patient's tracheostomy tube was downsized and later be cannulated 11/18/2013. Patient did develop some respiratory stridor placed on a prednisone taper x5 days per pulmonary services. Patient is on a mechanical soft diet and tube feeds have been discontinued.. Patient with chronic anemia hemoglobin varying from 8.4-8.9. Subcutaneous heparin ongoing for DVT prophylaxis. Question plan to resume chronic Coumadin. Patient with profound deconditioning and was making progress with skilled PT/OT/SLP at [place] was referred for comprehensive rehabilitation program.   Patient's medical record from [facility] has been reviewed by the rehabilitation admission coordinator and physician.  Past Medical History  Past Medical History   Diagnosis  Date   .  PAF (paroxysmal atrial fibrillation)     .  Anemia of chronic disease     .  HTN (hypertension)     .  Morbid obesity     .  Diabetes mellitus, type II     .  Hyperthyroidism     .  Hyperlipidemia     .  CKD (chronic kidney disease), stage III     .  DVT (deep venous thrombosis)        Family History  family history is not on file.   Prior Rehab/Hospitalizations: pt had previous home  health PT following her TKR.            Current Medications See MAR   Note: Allergies [facility] Record: Penicillin, fish oil, codeine/invanz   Patient's Current Diet:  Dys II, heart healthy/carb modified with chopped meats and nectar thick. Supervision. Pt with food allergies: no nuts, no peanut butter, no seed foods, no grits, no strawberries, no shell fish.   Precautions / Restrictions Precautions Precautions: Fall Precaution Comments: pt with colostomy, PEG, current mid abdominal wound site (with previous wound VAC, wound consult placed, anticipate wound VAC placement), healing trach site/stoma with small air leak (Sig. Food allergies: see above)    Prior Activity Level Community (5-7x/wk): pt got out every day and was driving, helping to care for her grandkids across the street (involved in caring for her 62 yo grandson and other grandkids)   Development worker, international aid / [place] Devices/Equipment: Cane (specify quad or straight) (single point cane). Pt used this cane sometimes when her knee was bothering her.      Prior Functional Level  Current Functional Level   Bed Mobility   Independent   Max assist (min to mod assist with sup to sit; max assist w/ sit to supi)    Transfers   Independent   Mod assist (mod assist x 2, limited steps to chair)    Mobility - Walk/Wheelchair   Independent   Mod assist (mod assist x 2, limited steps to chair)    Upper Body Dressing   Independent   Mod assist (weak UE's, poor endurance)    Lower Body Dressing   Independent   Max assist    Grooming   Independent   Min assist (set up to wash hands/face, brushing teeth minA)    Eating/Drinking   Independent   Other (supervision and set up)    Toilet Transfer   Independent   Mod assist (mod A x 2 to [facility], also using bed pan)    Bladder Continence     WFL   using bed pan or BSC    Bowel Management   WFL   pt with current colostomy    Stair Climbing    Independent   Other (not assessed)    Communication   WFL   grossly WFL but at times, slightly delayed in responses    Memory   WFL   appears WFL    Cooking/Meal Prep   Independent        Housework   Independent and family also helped      Money Management   Independent      Driving   Independent, got out everyday         Special needs/care consideration BiPAP/CPAP - yes, has CPAP. Family to bring in her home CPAP. CPM no  Continuous Drip IV no   Dialysis no           Life Vest no   Oxygen no   Special Bed no   Trach Size - healing stoma site, trach pulled out on 11-17-13 Wound Vac (area) - had midline abdominal wound VAC, now taken off. Anticipate a wound care consult and likely placement of wound VAC again. Pt now with wet to dry incision.        Skin - pt with abdominal wound site (previous wound VAC), colostomy site, L upper arm small wound, healing stoma                                Bowel mgmt: colostomy Bladder mgmt: using BSC with assist of 2 or bedpan            Diabetic mgmt - Pt was not previously diabetic but has been receiving insulin over the course of these medical complications.   Previous Home Environment Living Arrangements: Children (lives with dtr Ivin Booty, S-I-L and granddtr Ubaldo Glassing)  Lives With: Family Available Help at Discharge: Family (many involved family members, dtr/S-I-L she lives with do work outside the home. S-I-L gets home from work around 1-3 pm depending if he needs to pick up the boys) Type of Home: House Home Layout: One level Home Access: Stairs to enter Technical brewer of Steps: 4   Discharge Living Setting Plans for Discharge Living Setting: Lives with (comment) (dtr Ivin Booty, S-i-L, and granddtr.) Type of Home at Discharge: House Discharge Home Layout: One level Discharge Home Access: Stairs to enter [place] of Steps: 4   [place] Patient Roles: Caregiver (enjoys caring for her  grandkids) Contact Information: dtr Katrina is primary contact Anticipated Caregiver: multiple family members beside Ivin Booty, niece graduating will be primary helper during day while dtr is at work, other [employer] can also help. Anticipated Caregiver's Contact Information: see above Ability/Limitations of Caregiver: family plans on having someone with pt all the time Caregiver Availability: 24/7 Discharge Plan Discussed with Primary Caregiver: Yes (discussed with dtr Katrina, other dtr, two son-in-laws and pt's brother.) Is Caregiver In Agreement with Plan?: Yes Does Caregiver/Family have Issues with Lodging/Transportation while Pt is in Rehab?: No   Goals/Additional Needs Patient/Family Goal for Rehab: supervision and mod indep. with PT/OT/SLP Expected length of stay: 11-14 days Cultural Considerations: none Dietary Needs: Dys 2, heart healthy, nectar, chopped meats, supervised. Peanut allergy. (No nuts/peanut butter, no seed foods, no grits, no strawberries, no shell fish) Equipment Needs: to be determined Pt/Family Agrees to Admission and willing to participate: Yes (discussed with family on 11-18-13, 11-21-13 and 11-22-13) Program Orientation Provided & Reviewed with Pt/Caregiver Including Roles  & Responsibilities: Yes   Patient Condition: I met with pt and her family (many family members including two daughters, two sons-in-law and 2 grandchildren) on 11-18-13, 11-21-13 and 11-22-13 to explain the possibility and purpose of acute inpatient rehabilitation. This 68 year old patient was previously independent in all aspects of mobility and self care and she played an active role in caring for her grandchildren who live across the street. Pt has now had an extensive medical course following the removal of a colon polyp in mid March. See above details for her medical course regarding her exploratory lap/diverting colostomy and respiratory failure. Patient is currently requiring moderate  assistance of 2 for limited steps to the chair and moderate to maximal assistance with basic  ADL tasks. In addition, pt has speech/diet progression needs as she is currently on a dysphagia II diet. She will also benefit from skilled SLP services to address higher level cognitive needs following this prolonged medical course as she was completely independent in the community prior to her surgery. She will benefit greatly from the multi-disciplinary team of skilled PT, OT, SLP and rehab nursing to focus on increasing strength for greater independence in bed mobility, transfers, gait and self care tasks. In addition, pt will benefit from physician intervention to monitor her complex medical status involving issues of diabetic/HTN and coumadin management. Discussed case with Dr. Naaman Plummer and rehab team who stated that pt is a good inpatient rehab candidate. We received medical clearance from Dr. Laren Everts from [facility]. Pt and her large, supportive family are motivated to come to inpatient rehab and will benefit from the intensive services of skilled therapy under rehab physician guidance. Pt will be admitted today on 11-22-13.      Preadmission Screen Completed By:  Ave Filter, PT, 11/22/2013 12:25 pm ______________________________________________________________________    Discussed status with Dr. Naaman Plummer on 11-22-13 at 1225 pm and received telephone approval for admission today.   Admission Coordinator:  Ave Filter, PT, time 1225/Date 11/22/13    Assessment/Plan: Diagnosis: deconditioning after multiple medical issues Does the need for close, 24 hr/day  Medical supervision in concert with the patient's rehab needs make it unreasonable for this patient to be served in a less intensive setting? Yes Co-Morbidities requiring supervision/potential complications: afib, htn, morbid obesity, ACD, CKD Due to bladder management, bowel management, safety, skin/wound care, disease management, medication  administration, pain management and patient education, does the patient require 24 hr/day rehab nursing? Yes Does the patient require coordinated care of a physician, rehab nurse, PT (1-2 hrs/day, 5 days/week), OT (1-2 hrs/day, 5 days/week) and SLP (1-2 hrs/day, 5 days/week) to address physical and functional deficits in the context of the above medical diagnosis(es)? Yes Addressing deficits in the following areas: balance, endurance, locomotion, strength, transferring, bowel/bladder control, bathing, dressing, feeding, grooming, toileting, cognition and swallowing Can the patient actively participate in an intensive therapy program of at least 3 hrs of therapy 5 days a week? Yes The potential for patient to make measurable gains while on inpatient rehab is excellent Anticipated functional outcomes upon discharge from inpatients are: modified independent and supervision PT, modified independent and supervision OT, modified independent and supervision SLP Estimated rehab length of stay to reach the above functional goals is: 11-14 days Does the patient have adequate social supports to accommodate these discharge functional goals? Yes Anticipated D/C setting: Home Anticipated post D/C treatments: HH therapy Overall Rehab/Functional Prognosis: excellent       RECOMMENDATIONS: This patient's condition is appropriate for continued rehabilitative care in the following setting: CIR Patient has agreed to participate in recommended program. Yes Note that insurance prior authorization may be required for reimbursement for recommended care.   Comment: Admit to CIR today   Meredith Staggers, MD, [place], PT 11/22/2013  Revision History...     Date/Time User Action   11/22/2013 1:20 PM Meredith Staggers, MD Sign   11/22/2013 12:26 PM [place] Details Report

## 2013-11-23 NOTE — Evaluation (Signed)
Speech Language Pathology Assessment and Plan  Patient Details  Name: Sarah Tran MRN: [id number] Date of Birth: 1946-03-01  SLP Diagnosis: Cognitive Impairments;Speech and Language deficits;Dysphagia  Rehab Potential: Good ELOS: 20-24 days   Today's Date: 11/23/2013 Time: 0900-1000 Time Calculation (min): 60 min  Problem List:  Patient Active Problem List   Diagnosis Date Noted  . Physical deconditioning 11/22/2013  . Acute on chronic respiratory failure 10/26/2013  . 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 Toxic metabolic encephalopathy 42/68/3419  . Hypervolemia 10/26/2013  . Acute on chronic renal insufficiency 10/26/2013  . DM2 (diabetes mellitus, type 2) 10/26/2013  . 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 PAF (paroxysmal atrial fibrillation)   . Anemia of chronic disease   . HTN (hypertension)   . Morbid obesity   . Diabetes mellitus, type II   . Hyperthyroidism   . Hyperlipidemia   . CKD (chronic kidney disease), stage III   . DVT (deep venous thrombosis)    Past Surgical History: No past surgical history on file.  Assessment / Plan / Recommendation Clinical Impression Pt is a 68 year old right-handed morbidly obese African American female. PMH of hypertension, renal insufficiency, obstructive sleep apnea with CPAP, DVT/A. fib with chronic Coumadin. Pt was admitted to [facility] 09/28/2013 for removal of a ileal polyp. Noted perioperative complications balloon catheter became dislodged. She required exploratory laparotomy and diverting colostomy as well as appendectomy secondary to perforated sigmoid colon. Her hospital was complicated by respiratory failure requiring intubation and eventually tracheostomy as well g-tube. She remained in intensive care for 28 days. Pt developed sepsis. Pt stabilized and was transferred to [facility] 10/25/2013 for further vent weaning and wound  care. An echocardiogram is completed 10/28/2013 showing mild LVH with ejection fraction of 55-60%. 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 Hospital course complicated by acute renal insufficiency with creatinine 2.51, A. fib with RVR. Pt's tracheostomy tube was downsized and later decannulated 11/18/2013. Pt developed some respiratory stridor and was placed on a prednisone taper. Pt is on a mechanical soft diet and tube feeds have been discontinued. Pt with profound deconditioning and was admitted for comprehensive rehabilitation program.   SLP bedside-swallow evaluation revealed no overt s/s of aspiration, however with a heightened risk for aspiration given increased SOB and cognitive impairments. SLP cognitive-linguistic evaluation indicated disorientation and delayed processing time, with impaired awareness, problem solving, and recall of new information. Pt reports intermittent anomia, which is likely related to impaired cognitive function. Pt will benefit from skilled SLP services to maximize swallowing safety and cognitive-linguistic function to increase functional independence and decrease caregiver burden prior to d/c home with family.   Skilled Therapeutic Interventions          SLP bedside-swallow and cognitive-linguistic evaluations were completed with results and recommendations reviewed with the patient and her brother.   SLP Assessment  Patient will need skilled Speech Lanaguage Pathology Services during CIR admission    Recommendations  Diet Recommendations: Dysphagia 3 (Mechanical Soft);Thin liquid Liquid Administration via: Cup;No straw Medication Administration: Whole meds with liquid Supervision: Patient able to self feed;Full supervision/cueing for compensatory strategies Compensations: Slow rate;Small sips/bites Postural Changes and/or Swallow Maneuvers: Seated  upright 90 degrees Oral Care Recommendations: Oral care BID Patient destination: Home Follow up Recommendations: Home Health SLP;24 hour supervision/assistance Equipment Recommended: None recommended by SLP  SLP Frequency 5 out of 7 days   SLP Treatment/Interventions Cognitive remediation/compensation;Cueing hierarchy;Dysphagia/aspiration precaution training;Environmental controls;Functional tasks;Internal/external aids;Patient/family education;Speech/Language facilitation    Pain Pain Assessment Pain Assessment: No/denies pain Prior Functioning Cognitive/Linguistic Baseline: Within functional limits Type of Home: House  Lives With: Alone Available Help at Discharge: Family;Available 24 hours/day Vocation:  (not currently working, unable to state if she is retired or what she used to do)  Short Term Goals: Week 1: SLP Short Term Goal 1 (Week 1): Pt will utilize safe swallowing strategies with recommended diet with supervision level cues SLP Short Term Goal 2 (Week 1): Pt will utilize environmental aids to demonstrate orientation x4 with Mod cues SLP Short Term Goal 3 (Week 1): Pt will demonstrate basic problem solving during familiar, functional tasks with Mod cues SLP Short Term Goal 4 (Week 1): Pt will verbalize at least at least 1 physical and 2 cognitive impairments with Mod cues SLP Short Term Goal 5 (Week 1): Pt will utilize exteral memory aids to facilitate recall of new and daily information with Mod cues SLP Short Term Goal 6 (Week 1): Pt will demonstrate sustained attention to familiar, functional task for 10 minutes with Mod cues  See FIM for current functional status Refer to Care Plan for Long Term Goals  Recommendations for other services: None  Discharge Criteria: Patient will be discharged from SLP if patient refuses treatment 3 consecutive times without medical reason, if treatment goals not met, if there is a change in medical status, if patient makes no progress  towards goals or if patient is discharged from hospital.  The above assessment, treatment plan, treatment alternatives and goals were discussed and mutually agreed upon: by patient and by family   Germain Osgood, M.A. CCC-SLP [phone]  Germain Osgood 11/23/2013, 11:51 AM

## 2013-11-23 NOTE — Progress Notes (Signed)
Patient information reviewed and entered into eRehab system by Oluwadamilola Deliz, RN, CRRN, PPS Coordinator.  Information including medical coding and functional independence measure will be reviewed and updated through discharge.     Per nursing patient was given "Data Collection Information Summary for Patients in Inpatient Rehabilitation Facilities with attached "Privacy Act Statement-Health Care Records" upon admission.  

## 2013-11-23 NOTE — Progress Notes (Signed)
Inpatient Diabetes Program Recommendations  AACE/ADA: New Consensus Statement on Inpatient Glycemic Control (2013)  Target Ranges:  Prepandial:   less than 140 mg/dL      Peak postprandial:   less than 180 mg/dL (1-2 hours)      Critically ill patients:  140 - 180 mg/dL   Inpatient Diabetes Program Recommendations Correction (SSI): Please check CBG's tidwc and HS. Pt is on insulin and oral sulfonylurea. Lab glucose this am at 68 and no further glucose testing ordered. Oral Agents: Please do not use oral agent glipizide while in the hospital as po intake may be unpredictable and variable. Pt may need correction insulin as well if cbg's are checked.   Thank you, Lenor Coffin, RN, CNS, Diabetes Coordinator [phone])

## 2013-11-23 NOTE — Progress Notes (Signed)
PA (Dan Angiulini) gave verbal order to resume therapy with PT today OOB; doppler test result complete.

## 2013-11-24 ENCOUNTER — Inpatient Hospital Stay (HOSPITAL_COMMUNITY): Payer: Medicare Other | Admitting: *Deleted

## 2013-11-24 ENCOUNTER — Inpatient Hospital Stay (HOSPITAL_COMMUNITY): Payer: Medicare Other | Admitting: Occupational Therapy

## 2013-11-24 ENCOUNTER — Inpatient Hospital Stay (HOSPITAL_COMMUNITY): Payer: Medicare Other | Admitting: Speech Pathology

## 2013-11-24 DIAGNOSIS — N289 Disorder of kidney and ureter, unspecified: Secondary | ICD-10-CM

## 2013-11-24 DIAGNOSIS — J9620 Acute and chronic respiratory failure, unspecified whether with hypoxia or hypercapnia: Secondary | ICD-10-CM

## 2013-11-24 DIAGNOSIS — E119 Type 2 diabetes mellitus without complications: Secondary | ICD-10-CM

## 2013-11-24 DIAGNOSIS — R5381 Other malaise: Secondary | ICD-10-CM

## 2013-11-24 LAB — GLUCOSE, CAPILLARY
GLUCOSE-CAPILLARY: 196 mg/dL — AB (ref 70–99)
Glucose-Capillary: 275 mg/dL — ABNORMAL HIGH (ref 70–99)
Glucose-Capillary: 58 mg/dL — ABNORMAL LOW (ref 70–99)
Glucose-Capillary: 52 mg/dL — ABNORMAL LOW (ref 70–99)
Glucose-Capillary: 71 mg/dL (ref 70–99)
Glucose-Capillary: 122 mg/dL — ABNORMAL HIGH (ref 70–99)
Glucose-Capillary: 150 mg/dL — ABNORMAL HIGH (ref 70–99)

## 2013-11-24 LAB — PROTIME-INR
INR: 1.15 (ref 0.00–1.49)
Prothrombin Time: 14.5 seconds (ref 11.6–15.2)

## 2013-11-24 MED ORDER — FAMOTIDINE 20 MG PO TABS
20.0000 mg | ORAL_TABLET | Freq: Every day | ORAL | Status: DC
  Administered 2013-11-25 – 2013-12-09 (×15): 20 mg via ORAL
  Filled 2013-11-24 (×16): qty 1

## 2013-11-24 MED ORDER — INSULIN DETEMIR 100 UNIT/ML ~~LOC~~ SOLN
9.0000 [IU] | Freq: Every day | SUBCUTANEOUS | Status: DC
  Administered 2013-11-24: 9 [IU] via SUBCUTANEOUS
  Filled 2013-11-24 (×2): qty 0.09

## 2013-11-24 MED ORDER — GLUCOSE 40 % PO GEL
ORAL | Status: AC
  Administered 2013-11-24: 09:00:00
  Filled 2013-11-24: qty 1

## 2013-11-24 MED ORDER — WARFARIN SODIUM 5 MG PO TABS
5.0000 mg | ORAL_TABLET | Freq: Once | ORAL | Status: AC
  Administered 2013-11-24: 5 mg via ORAL
  Filled 2013-11-24: qty 1

## 2013-11-24 NOTE — Consult Note (Signed)
WOC will follow up later this week for ostomy pouch change, she had pouch changed by the bedside nurse yesterday due to leakage.  Evalina Tabak [place] RN,CWOCN [phone]

## 2013-11-24 NOTE — Significant Event (Signed)
Hypoglycemic Event  CBG: 52  Treatment: 1 tube instant glucose  Symptoms: None  Follow-up CBG: Time:0910 CBG Result:71  Possible Reasons for Event: Unknown  Comments/MD notified:notified Kirsteins, MD no new orders given    Sarah Tran  Remember to initiate Hypoglycemia Order Set & complete

## 2013-11-24 NOTE — Progress Notes (Addendum)
Speech Language Pathology Daily Session Note  Patient Details  Name: Sarah Tran MRN: [id number] Date of Birth: Dec 07, 1945  Today's Date: 11/24/2013 Time: 1324-4010 Time Calculation (min): 57 min  Short Term Goals: Week 1: SLP Short Term Goal 1 (Week 1): Pt will utilize safe swallowing strategies with recommended diet with supervision level cues SLP Short Term Goal 2 (Week 1): Pt will utilize environmental aids to demonstrate orientation x4 with Mod cues SLP Short Term Goal 3 (Week 1): Pt will demonstrate basic problem solving during familiar, functional tasks with Mod cues SLP Short Term Goal 4 (Week 1): Pt will verbalize at least at least 1 physical and 2 cognitive impairments with Mod cues SLP Short Term Goal 5 (Week 1): Pt will utilize exteral memory aids to facilitate recall of new and daily information with Mod cues SLP Short Term Goal 6 (Week 1): Pt will demonstrate sustained attention to familiar, functional task for 10 minutes with Mod cues  Skilled Therapeutic Interventions: Pt was seen for skilled speech therapy targeting dysphagia management and orientation.  Upon arrival, pt was upright in bed with RN and nurse tech at bedside eating breakfast.  Pt exhibited no overt s/s of aspiration during presentations of her prescribed diet.  Pt was noted with shortness of breath following consecutive cup sips of thin liquids, no changes in respiratory status when cued to take small individual sips.  During administration of morning medications, pt requested to take medications whole in puree due to taste preference; however she was noted with increased difficulty swallowing medications in puree and reported that she felt her pill "get stuck" in her throat.  No difficulty was noted when swallowing medications whole with thin liquids or crushed in purees.  Furthermore, pt reports decreased PO intake secondary to poor appetite.  Per MBS report from previous facility, pt exhibited grossly intact  oropharyngeal swallowing function with no aspiration or penetration.  SLP will follow up for diet advancement as medically appropriate.  Additionally, pt was able to reorient to place and date with min cuing to use external aids.     FIM:  Comprehension Comprehension Mode: Auditory Comprehension: 5-Follows basic conversation/direction: With extra time/assistive device Expression Expression Mode: Verbal Expression: 5-Expresses basic needs/ideas: With extra time/assistive device Social Interaction Social Interaction: 5-Interacts appropriately 90% of the time - Needs monitoring or encouragement for participation or interaction. Problem Solving Problem Solving: 3-Solves basic 50 - 74% of the time/requires cueing 25 - 49% of the time Memory Memory: 2-Recognizes or recalls 25 - 49% of the time/requires cueing 51 - 75% of the time FIM - Eating Eating Activity: 5: Set-up assist for open containers;5: Supervision/cues  Pain Pain Assessment Pain Assessment: No/denies pain  Therapy/Group: Individual Therapy  Sarah Tran M.A. CFY SLP 11/24/2013, 12:28 PM

## 2013-11-24 NOTE — Progress Notes (Addendum)
Subjective/Complaints: 68 year old right-handed morbidly obese African American female. Past medical history of hypertension, renal insufficiency, obstructive sleep apnea with CPAP, DVT/A. fib with chronic Coumadin. Hospital course patient admitted to [facility] 09/28/2013 for removal of a ileal polyp. Noted perioperative complications balloon catheter became dislodged. She required exploratory laparotomy and diverting colostomy as well as appendectomy secondary to perforated sigmoid colon. Her hospital was complicated by respiratory failure requiring intubation and eventually tracheostomy as well his gastrostomy tube for nutritional support. She remained in intensive care for 28 days. Patient developed sepsis cultures growing gram-positive cocci in clusters maintained on broad-spectrum antibiotics. [facility] further vent weaning and wound care. An echocardiogram is completed 10/28/2013 showing mild LVH with ejection fraction of 55-60%. 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 Hospital course complicated by acute renal insufficiency with creatinine 2.51, A. fib with RVR, question angioedema from Invanz reaction and wound dehiscence. Patient's tracheostomy tube was downsized and later be cannulated 11/18/2013. Patient did develop some respiratory stridor placed on a prednisone taper x5 days  Hypoglycemic this morning asymptomatic Voice is stronger today denies shortness of breath chest pain abdominal pain Review of Systems - difficult to obtain secondary to disorientation and delayed response latency Objective: Vital Signs: Blood pressure 139/84, pulse 84, temperature 98.2 F (36.8 C), temperature source Oral, resp. rate 17, SpO2 100.00%. No results found. Results for orders placed during the hospital  encounter of 11/22/13 (from the past 72 hour(s))  PROTIME-INR     Status: None   Collection Time    11/22/13  7:04 PM      Result Value Ref Range   Prothrombin Time 13.1  11.6 - 15.2 seconds   INR 1.01  0.00 - 1.49  CBC WITH DIFFERENTIAL     Status: Abnormal   Collection Time    11/23/13  5:13 AM      Result Value Ref Range   WBC 5.3  4.0 - 10.5 K/uL   RBC 3.00 (*) 3.87 - 5.11 MIL/uL   Hemoglobin 8.7 (*) 12.0 - 15.0 g/dL   HCT 27.0 (*) 36.0 - 46.0 %   MCV 90.0  78.0 - 100.0 fL   MCH 29.0  26.0 - 34.0 pg   MCHC 32.2  30.0 - 36.0 g/dL   RDW 16.6 (*) 11.5 - 15.5 %   Platelets 159  150 - 400 K/uL   Neutrophils Relative % 67  43 - 77 %   Neutro Abs 3.5  1.7 - 7.7 K/uL   Lymphocytes Relative 20  12 - 46 %   Lymphs Abs 1.1  0.7 - 4.0 K/uL   Monocytes Relative 11  3 - 12 %   Monocytes Absolute 0.6  0.1 - 1.0 K/uL   Eosinophils Relative 2  0 - 5 %   Eosinophils Absolute 0.1  0.0 - 0.7 K/uL   Basophils Relative 0  0 - 1 %   Basophils Absolute 0.0  0.0 - 0.1 K/uL  COMPREHENSIVE METABOLIC PANEL     Status: Abnormal   Collection Time    11/23/13  5:13 AM      Result Value Ref Range   Sodium 140  137 - 147 mEq/L   Potassium 4.6  3.7 - 5.3 mEq/L   Chloride 100  96 - 112 mEq/L   CO2 27  19 - 32 mEq/L  Glucose, Bld 68 (*) 70 - 99 mg/dL   BUN 69 (*) 6 - 23 mg/dL   Creatinine, Ser 3.16 (*) 0.50 - 1.10 mg/dL   Calcium 9.8  8.4 - 10.5 mg/dL   Total Protein 6.2  6.0 - 8.3 g/dL   Albumin 2.6 (*) 3.5 - 5.2 g/dL   AST 12  0 - 37 U/L   ALT 22  0 - 35 U/L   Alkaline Phosphatase 97  39 - 117 U/L   Total Bilirubin 0.5  0.3 - 1.2 mg/dL   GFR calc non Af Amer 14 (*) >90 mL/min   GFR calc Af Amer 17 (*) >90 mL/min   Comment: (NOTE)     The eGFR has been calculated using the CKD EPI equation.     This calculation has not been validated in all clinical situations.     eGFR's persistently <90 mL/min signify possible Chronic Kidney     Disease.  PROTIME-INR     Status: None   Collection Time     11/23/13  5:13 AM      Result Value Ref Range   Prothrombin Time 13.4  11.6 - 15.2 seconds   INR 1.04  0.00 - 1.49  GLUCOSE, CAPILLARY     Status: Abnormal   Collection Time    11/23/13  9:22 PM      Result Value Ref Range   Glucose-Capillary 275 (*) 70 - 99 mg/dL  PROTIME-INR     Status: None   Collection Time    11/24/13  6:44 AM      Result Value Ref Range   Prothrombin Time 14.5  11.6 - 15.2 seconds   INR 1.15  0.00 - 1.49  GLUCOSE, CAPILLARY     Status: Abnormal   Collection Time    11/24/13  7:47 AM      Result Value Ref Range   Glucose-Capillary 58 (*) 70 - 99 mg/dL  GLUCOSE, CAPILLARY     Status: Abnormal   Collection Time    11/24/13  8:38 AM      Result Value Ref Range   Glucose-Capillary 52 (*) 70 - 99 mg/dL  GLUCOSE, CAPILLARY     Status: None   Collection Time    11/24/13  9:10 AM      Result Value Ref Range   Glucose-Capillary 71  70 - 99 mg/dL     HEENT: healing trach site with air leak  General: No acute distress Mood and affect are appropriate Heart: Regular rate and rhythm no rubs murmurs or extra sounds Lungs: Clear to auscultation, breathing unlabored, no rales or wheezes Abdomen: Positive bowel sounds, soft nontender to palpation, nondistended.  LLQ colostomy 3cm midline vertical wound Extremities: No clubbing, cyanosis, or edema Skin: No evidence of breakdown, no evidence of rash Neurologic: Cranial nerves II through XII intact, motor strength is 4/5 in bilateral deltoid, bicep, tricep, grip, hip flexor, knee extensors, ankle dorsiflexor and plantar flexor  Cerebellar exam normal finger to nose to finger on right, mild/mod dysmetria on left  Musculoskeletal:no pain with  Active range of motion in all 4 extremities. No joint swelling  Assessment/Plan: 1. Functional deficits secondary to Deconditioning and encephalopathy which require 3+ hours per day of interdisciplinary therapy in a comprehensive inpatient rehab setting. Physiatrist is providing  close team supervision and 24 hour management of active medical problems listed below. Physiatrist and rehab team continue to assess barriers to discharge/monitor patient progress toward functional and medical goals. FIM: FIM -  Bathing Bathing Steps Patient Completed: Chest;Right Arm;Left Arm Bathing: 2: Max-Patient completes 3-4 36f10 parts or 25-49%  FIM - Upper Body Dressing/Undressing Upper body dressing/undressing steps patient completed: Thread/unthread right sleeve of pullover shirt/dresss;Thread/unthread left sleeve of pullover shirt/dress Upper body dressing/undressing: 3: Mod-Patient completed 50-74% of tasks FIM - Lower Body Dressing/Undressing Lower body dressing/undressing: 1: Two helpers  FIM - Toileting Toileting: 1: Total-Patient completed zero steps, helper did all 3  FIM - TRadio producerDevices: Bedside commode Toilet Transfers: 1-Mechanical lift;1-Two helpers  FIM - BFinancial plannerTransfer: 2: Bed > Chair or W/C: Max A (lift and lower assist)  FIM - Locomotion: Wheelchair Locomotion: Wheelchair: 1: Travels less than 50 ft with moderate assistance (Pt: 50 - 74%) FIM - Locomotion: Ambulation Locomotion: Ambulation: 0: Activity did not occur  Comprehension Comprehension Mode: Auditory Comprehension: 5-Understands basic 90% of the time/requires cueing < 10% of the time  Expression Expression Mode: Verbal Expression: 5-Expresses complex 90% of the time/cues < 10% of the time  Social Interaction Social Interaction: 5-Interacts appropriately 90% of the time - Needs monitoring or encouragement for participation or interaction.  Problem Solving Problem Solving: 3-Solves basic 50 - 74% of the time/requires cueing 25 - 49% of the time  Memory Memory: 3-Recognizes or recalls 50 - 74% of the time/requires cueing 25 - 49% of the time   Medical Problem List and Plan:  1.  Functional deficits secondary to deconditioning related to respiratory failure multi-medical as above  2. DVT Prophylaxis/Anticoagulation: Subcutaneous heparin discontinued and chronic Coumadin resumed 11/22/2013 without bridging. . Check vascular study on admit.  3. Pain Management: Oxycodone immediate release 5 mg every 6 hours as needed  4. Mood: Clonazepam 0.5 mg 3 times a day. Team to provide positive reinforcement  5. Neuropsych: This patient is capable of making decisions on her own behalf.  6. 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 Developed respiratory stridor placed on prednisone taper x5 days. Check oxygen saturations every shift and monitory closely as we wean.  7. Dysphagia. Mechanical soft diet. Gastrostomy tube feeds on hold. Followup speech therapy on advancing diet  8. Diabetes mellitus. Glipizide 10 mg daily, below a.m. blood sugar reduce Levemir 9 units each bedtime. Check blood sugars a.c. and at bedtime. Monitor closely while on prednisone. Titrate regimen as needed.  9. Hypertension/atrial fibrillation. Lasix 40 mg daily, Norvasc 10 mg daily, Coreg 6.25 mg twice a day  10. Hypothyroidism. Tapazole 5 mg daily. Latest TSH level 2.230  11. Chronic anemia. Followup CBC up on admission as not checked recently.  12. Obstructive sleep apnea. Resume CPAP. Family to bring CPAP machine from home   LOS (Days) 2 A FACE TO FACE EVALUATION WAS PERFORMED  ACharlett Blake5/14/2015, 10:02 AM

## 2013-11-24 NOTE — Progress Notes (Signed)
Occupational Therapy Session Note  Patient Details  Name: Sarah Tran MRN: [id number] Date of Birth: Sep 01, 1945  Today's Date: 11/24/2013 Time: 1660-6004 and 5997-7414 Time Calculation (min): 45 min and 45 min  Short Term Goals: Week 1:  OT Short Term Goal 1 (Week 1): Pt will be able to stand at EOB with RW for 1 min with mod A to be able to pull pants over hips. OT Short Term Goal 2 (Week 1): Pt will don shirt with min A. OT Short Term Goal 3 (Week 1): Pt will demonstrate improved BUE shoulder AROM to lift her arms to be able to wash under them without assist. OT Short Term Goal 4 (Week 1): Pt will transfer to drop arm BSC with mod A x1.   Skilled Therapeutic Interventions/Progress Updates:    Visit 1: No c/o pain.  TX:  Pt seen this session for BADL retraining of B/D from bed level with a focus on rolling for LB self care and sitting tolerance on EOB for UB self care.  HOB adjusted to allow pt to reach to her upper legs to wash, she attempted to wash front perineal area but was not able to reach. Bed placed flat so pt could roll L/R with mod A to move her legs to wash bottom and get clothing over her hips.  Pt sat to EOB with mod A and then maintained balance at EOB to wash and dress UB. Pt was able to lift her arms slightly higher to wash armpits but not high enough to don her shirt. Pt completed grooming from bed.  Pt transferred to w/c via SB with max A x 2.  Pt adjusted in w/c with QRB. Her NT continued to get her set up in the room.  Visit 2:  No c/o pain. Pt's brother and sister were present for session. Pt had just started eating lunch in bed with HOB elevated. Pt encouraged to sit EOB to eat meal for better arm alignment and to work on . Mod A to sit EOB and tolerated sitting for 20 min finish lunch. Pt was then engaged in [facility] transfer training using short SB to the bsc and scooting off of BSC back to the bed. The SB did not work that well as it was sliding with the pt on the hard plastic  seat. Her brother assisted this clinician with completing the transfer to and from the [facility] to provide the pt max A.  Pt then used long SB from bed to w/c and pt was able to slide along board with mod A to lean forward and to keep feet under knees vs. Forward. Pt is fearful of leaning forward.  Pt settled in w/c with brother sitting with pt until her next PT session in 15 min.  Pt is demonstrating improved mental clarity and improved activity tolerance.   Therapy Documentation Precautions:  Precautions Precautions: Fall Precaution Comments: Pt with colostomy, PEG, mid abdominal wound site, healing trach site with small air leak; previous R TKA Restrictions Weight Bearing Restrictions: No    Vital Signs: Therapy Vitals Pulse Rate: 84 Resp: 17 BP: 139/84 mmHg Patient Position, if appropriate: Lying Oxygen Therapy SpO2: 100 % O2 Device: None (Room air)    ADL: ADL ADL Comments: Refer to FIM  See FIM for current functional status  Therapy/Group: Individual Therapy  Earle Gell 11/24/2013, 12:04 PM

## 2013-11-24 NOTE — IPOC Note (Signed)
Overall Plan of Care [facility]) Patient Details Name: Sarah Tran MRN: [id number] DOB: Dec 19, 1945  Admitting Diagnosis: Deconditioned   Hospital Problems: Active Problems:   Physical deconditioning     Functional Problem List: Nursing Bowel;Endurance;Medication Management;Motor;Safety;Sensory  PT Balance;Edema;Endurance;Motor;Safety;Sensory  OT Balance;Cognition;Endurance;Edema;Motor;Pain;Perception;Safety  SLP Cognition  TR         Basic ADL's: OT Grooming;Bathing;Dressing;Toileting     Advanced  ADL's: OT       Transfers: PT Bed Mobility;Bed to Chair;Car;Furniture  OT Toilet;Tub/Shower     Locomotion: PT Ambulation;Wheelchair Mobility     Additional Impairments: OT Fuctional Use of Upper Extremity  SLP Swallowing;Communication;Social Cognition expression Problem Solving;Memory;Attention;Awareness  TR      Anticipated Outcomes Item Anticipated Outcome  Self Feeding set up  Swallowing  supervision with least restrictive PO   Basic self-care  set up grooming, min A bathing, dressing   Toileting  min A   Bathroom Transfers min A  Bowel/Bladder  managed colostomy and bladder   Transfers  supervision  Locomotion  supervision gait x 50' controlled and home env, stairs TBD, w/c x 50' with supervision   Communication  supervision  Cognition  Min  Pain  less than 2   Safety/Judgment  min assist    Therapy Plan: PT Intensity: Minimum of 1-2 x/day ,45 to 90 minutes PT Frequency: 5 out of 7 days PT Duration Estimated Length of Stay: 21-24 days OT Intensity: Minimum of 1-2 x/day, 45 to 90 minutes OT Frequency: 5 out of 7 days OT Duration/Estimated Length of Stay: 20-24 days SLP Intensity: Minumum of 1-2 x/day, 30 to 90 minutes SLP Frequency: 5 out of 7 days SLP Duration/Estimated Length of Stay: 20-24 days       Team Interventions: Nursing Interventions Patient/Family Education;Bladder Management;Bowel Management;Disease Management/Prevention;Medication  Management;Skin Care/Wound Management;Cognitive Remediation/Compensation;Dysphagia/Aspiration Precaution Training;Discharge Planning;Psychosocial Support  PT interventions Ambulation/gait training;Balance/vestibular training;Cognitive remediation/compensation;Discharge planning;Community reintegration;DME/adaptive equipment instruction;Functional mobility training;Patient/family education;Neuromuscular re-education;Psychosocial support;Splinting/orthotics;Therapeutic Exercise;Therapeutic Activities;Stair training;UE/LE Strength taining/ROM;UE/LE Coordination activities;Wheelchair propulsion/positioning  OT Interventions Balance/vestibular training;Cognitive remediation/compensation;Discharge planning;DME/adaptive equipment instruction;Functional mobility training;Patient/family education;Psychosocial support;Self Care/advanced ADL retraining;Therapeutic Activities;Therapeutic Exercise;UE/LE Strength taining/ROM;UE/LE Coordination activities  SLP Interventions Cognitive remediation/compensation;Cueing hierarchy;Dysphagia/aspiration precaution training;Environmental controls;Functional tasks;Internal/external aids;Patient/family education;Speech/Language facilitation  TR Interventions    SW/CM Interventions Discharge Planning;Psychosocial Support;Patient/Family Education    Team Discharge Planning: Destination: PT-Home ,OT- Home , SLP-Home Projected Follow-up: PT-Home health PT, OT-  Home health OT, SLP-Home Health SLP;24 hour supervision/assistance Projected Equipment Needs: PT-Rolling walker with 5" wheels;Wheelchair cushion (measurements);Wheelchair (measurements);To be determined, OT- 3 in 1 bedside comode;Tub/shower bench, SLP-None recommended by SLP Equipment Details: PT- , OT-  Patient/family involved in discharge planning: PT- Patient,  OT-Patient;Family member/caregiver, SLP-Patient;Family member/caregiver  MD ELOS: 11-14d Medical Rehab Prognosis:  Excellent Assessment: 68 year old  right-handed morbidly obese African American female. Past medical history of hypertension, renal insufficiency, obstructive sleep apnea with CPAP, DVT/A. fib with chronic Coumadin. Hospital course patient admitted to [facility] 09/28/2013 for removal of a ileal polyp. Noted perioperative complications balloon catheter became dislodged. She required exploratory laparotomy and diverting colostomy as well as appendectomy secondary to perforated sigmoid colon. Her hospital was complicated by respiratory failure requiring intubation and eventually tracheostomy as well his gastrostomy tube for nutritional support. She remained in intensive care for 28 days. Patient developed sepsis cultures growing gram-positive cocci in clusters maintained on broad-spectrum antibiotics. [facility] stabilized [facility] for further vent weaning and wound care  Now requiring 24/7 Rehab RN,MD, as well as CIR level PT, OT and SLP.  Treatment team will focus on ADLs and mobility [facility]    See Team Conference Notes for weekly updates to the plan of care

## 2013-11-24 NOTE — Significant Event (Signed)
Hypoglycemic Event  CBG:58  Treatment: 15 GM carbohydrate snack  Symptoms: None  Follow-up CBG: [id number] CBG Result:52  Possible Reasons for Event: Unknown  Comments/MD notified: Kirsteins, MD made aware. No new orders given    Sarah Tran  Remember to initiate Hypoglycemia Order Set & complete

## 2013-11-24 NOTE — Progress Notes (Signed)
ANTICOAGULATION CONSULT NOTE - Follow Up Consult  Pharmacy Consult for coumadin Indication: atrial fibrillation  And hx of DVT   Allergies  Allergen Reactions  . Codeine Other (See Comments)    Pass out    . Peanut Butter Flavor Hives and Other (See Comments)    Lockjaw   . Penicillins Swelling    Patient Measurements:   Heparin Dosing Weight:   Vital Signs: Temp: 98.2 F (36.8 C) (05/14 0528) Temp src: Oral (05/14 0528) BP: 143/86 mmHg (05/14 0528) Pulse Rate: 99 (05/14 0528)  Labs:  Recent Labs  11/22/13 0625 11/22/13 1904 11/23/13 0513 11/24/13 0644  HGB  --   --  8.7*  --   HCT  --   --  27.0*  --   PLT  --   --  159  --   LABPROT  --  13.1 13.4 14.5  INR  --  1.01 1.04 1.15  CREATININE 3.05*  --  3.16*  --     The CrCl is unknown because both a height and weight (above a minimum accepted value) are required for this calculation.   Medications:  Scheduled:  . atorvastatin  10 mg Oral q1800  . carvedilol  6.25 mg Oral BID WC  . clonazePAM  0.5 mg Oral BID  . famotidine  20 mg Oral BID  . furosemide  40 mg Oral Daily  . glipiZIDE  10 mg Oral QAC breakfast  . insulin detemir  12 Units Subcutaneous QHS  . lactobacillus  1 g Oral TID WC  . methimazole  5 mg Oral Daily  . nitroGLYCERIN  1 inch Topical 4 times per day  . potassium chloride  40 mEq Oral Daily  . predniSONE  15 mg Oral Q breakfast  . senna-docusate  1 tablet Oral BID  . sodium bicarbonate  1,300 mg Oral BID  . timolol  1 drop Both Eyes BID  . vitamin C  500 mg Oral BID  . Warfarin - Pharmacist Dosing Inpatient   Does not apply q1800  . zinc sulfate  220 mg Oral Daily   Infusions:    Assessment: 68 yo female with afib and hx of DVT is currently on subtherapeutic coumadin.  INR today up to 1.15. Goal of Therapy:  INR 2-3 Monitor platelets by anticoagulation protocol: Yes   Plan:  1) Coumadin 5mg  po x1 2) Daily PT/INR  Tsz-Yin Adelie Croswell 11/24/2013,8:30 AM

## 2013-11-24 NOTE — Progress Notes (Signed)
Physical Therapy Session Note  Patient Details  Name: Sarah Tran MRN: [id number] Date of Birth: 07-Feb-1946  Today's Date: 11/24/2013 Time: 13:05-13:50 ( )  Short Term Goals: Week 1:  PT Short Term Goal 1 (Week 1): Pt will roll L><R in flat hospital bed without rails, mod assist. PT Short Term Goal 2 (Week 1): Pt will transfer bed>< w/c with SB, mod assist for level surfaces, 50% of the time. PT Short Term Goal 3 (Week 1): Pt will propel w/c x 25' min assist. PT Short Term Goal 4 (Week 1): Pt will perform gait with LRAD +2 assist, x 10'.  Skilled Therapeutic Interventions/Progress Updates:  Tx focused on therex and functional mobility for increased activity tolerance and independence with transfers.  Pt up in [facility], family present. Pt unable to reach wheels sufficient for propulsion and 22" chair unavailable. She did travel short distance with assist and increased time required.   Sit<>stand at // bars for increased functional strength for transfers. Pt needing max lifting assist, cues and encouragement. Pt able to stand 1x20sec and 1x38sec with min steadying assist, limited by fatigue. HR elevated to 104bpm, needing several minutes to return <100.   Performed seated therex with cues for technique during seated rest breaks. Limited AROM due to weakness for 2x20 marching and LAQ  Instructed pt in sliding baord level transfers with Max A WC<>mat with cues for technique, timing, and sequence. Provided demo on hip clearing technique. Assist needed for board placement. Pt unable to recall technique from one trial to the next. Pt quite fearful needing encouragement, but performance improved with practice. Pt unable to really get enough anterior translation over BOS, but can advance hips.   Pt left up in [facility] with all needs in reach, calling nurse to use bathroom.      Therapy Documentation Precautions:  Precautions Precautions: Fall Precaution Comments: Pt with colostomy, PEG, mid abdominal  wound site, healing trach site with small air leak; previous R TKA Restrictions Weight Bearing Restrictions: No General:   Vital Signs: Therapy Vitals Pulse Rate: 101 Oxygen Therapy SpO2: 96 % O2 Device: None (Room air) Pain: Pain Assessment Pain Assessment: No/denies pain  See FIM for current functional status  Therapy/Group: Individual Therapy Clydene Laming, PT, DPT   11/24/2013, 2:33 PM

## 2013-11-25 ENCOUNTER — Inpatient Hospital Stay (HOSPITAL_COMMUNITY): Payer: Medicare Other | Admitting: Speech Pathology

## 2013-11-25 ENCOUNTER — Inpatient Hospital Stay (HOSPITAL_COMMUNITY): Payer: Medicare Other | Admitting: *Deleted

## 2013-11-25 DIAGNOSIS — J9620 Acute and chronic respiratory failure, unspecified whether with hypoxia or hypercapnia: Secondary | ICD-10-CM

## 2013-11-25 DIAGNOSIS — R5381 Other malaise: Secondary | ICD-10-CM

## 2013-11-25 DIAGNOSIS — E119 Type 2 diabetes mellitus without complications: Secondary | ICD-10-CM

## 2013-11-25 DIAGNOSIS — N289 Disorder of kidney and ureter, unspecified: Secondary | ICD-10-CM

## 2013-11-25 LAB — CBC
HCT: 29.5 % — ABNORMAL LOW (ref 36.0–46.0)
Hemoglobin: 9.4 g/dL — ABNORMAL LOW (ref 12.0–15.0)
MCH: 29.3 pg (ref 26.0–34.0)
MCHC: 31.9 g/dL (ref 30.0–36.0)
MCV: 91.9 fL (ref 78.0–100.0)
Platelets: 150 10*3/uL (ref 150–400)
RBC: 3.21 MIL/uL — ABNORMAL LOW (ref 3.87–5.11)
RDW: 16.6 % — AB (ref 11.5–15.5)
WBC: 6.8 10*3/uL (ref 4.0–10.5)

## 2013-11-25 LAB — GLUCOSE, CAPILLARY
GLUCOSE-CAPILLARY: 86 mg/dL (ref 70–99)
GLUCOSE-CAPILLARY: 110 mg/dL — AB (ref 70–99)
GLUCOSE-CAPILLARY: 171 mg/dL — AB (ref 70–99)
Glucose-Capillary: 66 mg/dL — ABNORMAL LOW (ref 70–99)
Glucose-Capillary: 68 mg/dL — ABNORMAL LOW (ref 70–99)
Glucose-Capillary: 126 mg/dL — ABNORMAL HIGH (ref 70–99)

## 2013-11-25 LAB — PROTIME-INR
INR: 1.22 (ref 0.00–1.49)
PROTHROMBIN TIME: 15.1 s (ref 11.6–15.2)

## 2013-11-25 MED ORDER — INSULIN DETEMIR 100 UNIT/ML ~~LOC~~ SOLN
9.0000 [IU] | Freq: Every day | SUBCUTANEOUS | Status: DC
  Filled 2013-11-25: qty 0.09

## 2013-11-25 MED ORDER — WARFARIN SODIUM 6 MG PO TABS
6.0000 mg | ORAL_TABLET | Freq: Once | ORAL | Status: AC
  Administered 2013-11-25: 6 mg via ORAL
  Filled 2013-11-25: qty 1

## 2013-11-25 MED ORDER — HEPARIN SODIUM (PORCINE) 5000 UNIT/ML IJ SOLN
5000.0000 [IU] | Freq: Two times a day (BID) | INTRAMUSCULAR | Status: DC
  Administered 2013-11-25 – 2013-11-30 (×11): 5000 [IU] via SUBCUTANEOUS
  Filled 2013-11-25 (×13): qty 1

## 2013-11-25 NOTE — Significant Event (Signed)
Hypoglycemic Event  CBG: 68  Treatment: 15 GM carbohydrate snack  Symptoms: None  Follow-up CBG: Time:1652 CBG Result:129  Possible Reasons for Event: Unknown  Comments/MD notified:will notify Pam Love,PA    Sarah Tran D Erasto Sleight  Remember to initiate Hypoglycemia Order Set & complete

## 2013-11-25 NOTE — Progress Notes (Signed)
Speech Language Pathology Daily Session Note  Patient Details  Name: Sarah Tran MRN: [id number] Date of Birth: 1946/05/16  Today's Date: 11/25/2013 Time: 7408-1448 Time Calculation (min): 45 min  Short Term Goals: Week 1: SLP Short Term Goal 1 (Week 1): Pt will utilize safe swallowing strategies with recommended diet with supervision level cues SLP Short Term Goal 2 (Week 1): Pt will utilize environmental aids to demonstrate orientation x4 with Mod cues SLP Short Term Goal 3 (Week 1): Pt will demonstrate basic problem solving during familiar, functional tasks with Mod cues SLP Short Term Goal 4 (Week 1): Pt will verbalize at least at least 1 physical and 2 cognitive impairments with Mod cues SLP Short Term Goal 5 (Week 1): Pt will utilize exteral memory aids to facilitate recall of new and daily information with Mod cues SLP Short Term Goal 6 (Week 1): Pt will demonstrate sustained attention to familiar, functional task for 10 minutes with Mod cues  Skilled Therapeutic Interventions: Pt was seen for skilled speech therapy targeting memory and executive function.  Upon arrival, pt was partially reclined in bed and was noted with increased shortness of breath in comparison to previous tx sessions.  Pt had no complaints of discomfort or distress.  Vital signs were taken: blood pressure 161/95, oxygen sats 98 on room air.  During functional conversations regarding current goals and progress in therapy, pt identified at least one activity from her PT an OT tx sessions with supervision.  Pt reports that she notices her memory fluctuating with this acute hospitalization.  As a result, SLP initiated education related to compensatory strategies for memory.  Pt immediately recalled 3/4 of the aforementioned strategies independently, improving to 4/4 with mod assist.   Additionally, pt was 71% independent for working memory, mental flexibility, and thought organization during a structured generative naming  task, improving to 86% accuracy with min assist, and 100% accuracy with mod assist.     FIM:  Comprehension Comprehension Mode: Visual Comprehension: 5-Understands basic 90% of the time/requires cueing < 10% of the time Expression Expression Mode: Verbal Expression: 5-Expresses basic 90% of the time/requires cueing < 10% of the time. Social Interaction Social Interaction: 5-Interacts appropriately 90% of the time - Needs monitoring or encouragement for participation or interaction. Problem Solving Problem Solving: 4-Solves basic 75 - 89% of the time/requires cueing 10 - 24% of the time Memory Memory: 4-Recognizes or recalls 75 - 89% of the time/requires cueing 10 - 24% of the time FIM - Eating Eating Activity: 5: Set-up assist for open containers;5: Supervision/cues  Pain Pain Assessment Pain Assessment: No/denies pain  Therapy/Group: Individual Therapy  Melanee Spry Alline Pio M.A. CFY SLP 11/25/2013, 4:02 PM

## 2013-11-25 NOTE — Progress Notes (Signed)
Physical Therapy Session Note  Patient Details  Name: Sarah Tran MRN: [id number] Date of Birth: 1945-10-03  Today's Date: 11/25/2013 Time:  [phone] ( )    Short Term Goals: Week 1:  PT Short Term Goal 1 (Week 1): Pt will roll L><R in flat hospital bed without rails, mod assist. PT Short Term Goal 2 (Week 1): Pt will transfer bed>< w/c with SB, mod assist for level surfaces, 50% of the time. PT Short Term Goal 3 (Week 1): Pt will propel w/c x 25' min assist. PT Short Term Goal 4 (Week 1): Pt will perform gait with LRAD +2 assist, x 10'.  Skilled Therapeutic Interventions/Progress Updates:  Tx focused on supine therex, bed mobility, functional transfers, and standing. Pt very fatigued from exertion, needing to stop to catch breath after each bed mobility component.    Rolling with mod A R/L with rails x3 each side with assist for hip/knee flexion. Pt actively processing and verbalizing steps for rolling to increase recall.   Performed supine and sidelying therex for functional strength including 2x10 each of the followign with assist for alignment and full ROM: heel slides, bridges, clamshells, and ankle pumps. Seated LAQ as well.   Perofrmed sidlying >sit with max for LE management and trunk llifting. Cues for full roll and pt able to lift trunk quite well.   Serial sit<>stand to RW x4 with max A but pt unable to come to full upright x1.   Sliding baord transfer with max A bed>WC and second person assist for sit>stand to adjust pad.     Therapy Documentation Precautions:  Precautions Precautions: Fall Precaution Comments: Pt with colostomy, PEG, mid abdominal wound site, healing trach site with small air leak; previous R TKA Restrictions Weight Bearing Restrictions: No  Pain: 2/10 L knee pain, modified tx    See FIM for current functional status  Therapy/Group: Individual Therapy Clydene Laming, PT, DPT  11/25/2013, 10:09 AM

## 2013-11-25 NOTE — Progress Notes (Signed)
ANTICOAGULATION CONSULT NOTE - Follow Up Consult  Pharmacy Consult for coumadin Indication: afib and hx of DVT  Allergies  Allergen Reactions  . Codeine Other (See Comments)    Pass out    . Peanut Butter Flavor Hives and Other (See Comments)    Lockjaw   . Penicillins Swelling    Patient Measurements:   Heparin Dosing Weight:   Vital Signs: Temp: 98.2 F (36.8 C) (05/15 0622) Temp src: Oral (05/14 2149) BP: 140/79 mmHg (05/15 0622) Pulse Rate: 97 (05/15 0622)  Labs:  Recent Labs  11/23/13 0513 11/24/13 0644 11/25/13 0532  HGB 8.7*  --   --   HCT 27.0*  --   --   PLT 159  --   --   LABPROT 13.4 14.5 15.1  INR 1.04 1.15 1.22  CREATININE 3.16*  --   --     The CrCl is unknown because both a height and weight (above a minimum accepted value) are required for this calculation.   Medications:  Scheduled:  . atorvastatin  10 mg Oral q1800  . carvedilol  6.25 mg Oral BID WC  . clonazePAM  0.5 mg Oral BID  . famotidine  20 mg Oral Daily  . furosemide  40 mg Oral Daily  . glipiZIDE  10 mg Oral QAC breakfast  . insulin detemir  9 Units Subcutaneous QHS  . lactobacillus  1 g Oral TID WC  . methimazole  5 mg Oral Daily  . nitroGLYCERIN  1 inch Topical 4 times per day  . potassium chloride  40 mEq Oral Daily  . predniSONE  15 mg Oral Q breakfast  . senna-docusate  1 tablet Oral BID  . sodium bicarbonate  1,300 mg Oral BID  . timolol  1 drop Both Eyes BID  . vitamin C  500 mg Oral BID  . Warfarin - Pharmacist Dosing Inpatient   Does not apply q1800  . zinc sulfate  220 mg Oral Daily   Infusions:    Assessment: 68 yo female with afib and history of DVT is currently on subtherapeutic coumadin. INR didn't move much at 1.22 from 1.15.  Goal of Therapy:  INR 2-3 Monitor platelets by anticoagulation protocol: Yes   Plan:  1) Coumadin 6 mg po x1 2) INR in am   Sarah Tran 11/25/2013,8:26 AM

## 2013-11-25 NOTE — Progress Notes (Signed)
Occupational Therapy Session Note  Patient Details  Name: Sarah Tran MRN: [id number] Date of Birth: 07-22-1945  Today's Date: 11/25/2013 Time: 1100-1225 (85 min)    1st session) Time Calculation (min):85 min  Short Term Goals: Week 1:  OT Short Term Goal 1 (Week 1): Pt will be able to stand at EOB with RW for 1 min with mod A to be able to pull pants over hips. OT Short Term Goal 2 (Week 1): Pt will don shirt with min A. OT Short Term Goal 3 (Week 1): Pt will demonstrate improved BUE shoulder AROM to lift her arms to be able to wash under them without assist. OT Short Term Goal 4 (Week 1): Pt will transfer to drop arm BSC with mod A x1.  Week 2:     Skilled Therapeutic Interventions/Progress Updates:       1st session:  Pt. Sitting in wc upon OT arrival.  Pt. Chose to sponge bathe at sink.  Addressed BUE AROM, transfers, bed mobility during session.  Pt. Bathed UB and donned shirt with minimal assist.   Transferred to bed with sliding board with mod assist and instructional cues for proper body mechanics with bending knees and leaning forward.  Pt. Was mod assist for sit to supine.  Pt reported after lying down that she need to urinate.  Rolled to right and left with placing bed pan.  Pt too fatiqued to use BSC.  Cleaned peri area and donned pants and underwear with total assist.  Pt rolled with min assist to each side.  Left in bed with call bell and bed alarm on.     Therapy Documentation Precautions:  Precautions Precautions: Fall Precaution Comments: Pt with colostomy, PEG, mid abdominal wound site, healing trach site with small air leak; previous R TKA Restrictions Weight Bearing Restrictions: No  2nd session:  Time:  1305-1330  (25 min) Pain:  None; labored breathing Individual session Pt. Lying in bed asleep.  Aroused pt and noticed labored breathing.  Pt. Reported she did not feel well and was having trouble breathing.  O2=  100% .  Repositioned pt in bed and brought HOB up.   Called nurse who changed dressing over trach site.  Pt. Stated she felt more comfortable.  She stated she did not feel like getting up or doing any exercises at this time.       Pain:  No complaints  ADL ADL Comments: Refer to FIM   See FIM for current functional status  Therapy/Group: Individual Therapy  Sarah Tran 11/25/2013, 11:30 AM

## 2013-11-25 NOTE — Significant Event (Signed)
Hypoglycemic Event  CBG: 66  Treatment: 15 GM carbohydrate snack  Symptoms: Shaky  Follow-up CBG: Time:1650 CBG Result:68  Possible Reasons for Event: Inadequate meal intake  Comments/MD notified Pam Love,Pa    Keywon Mestre D Caelen Reierson  Remember to initiate Hypoglycemia Order Set & complete

## 2013-11-25 NOTE — Progress Notes (Signed)
Subjective/Complaints: 68 year old right-handed morbidly obese African American female. Past medical history of hypertension, renal insufficiency, obstructive sleep apnea with CPAP, DVT/A. fib with chronic Coumadin. Hospital course patient admitted to [facility] 09/28/2013 for removal of a ileal polyp. Noted perioperative complications balloon catheter became dislodged. She required exploratory laparotomy and diverting colostomy as well as appendectomy secondary to perforated sigmoid colon. Her hospital was complicated by respiratory failure requiring intubation and eventually tracheostomy as well his gastrostomy tube for nutritional support. She remained in intensive care for 28 days. Patient developed sepsis cultures growing gram-positive cocci in clusters maintained on broad-spectrum antibiotics. [facility] stabilized [facility] for further vent weaning and wound care. An echocardiogram is completed 10/28/2013 showing mild LVH with ejection fraction of 55-60%. 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 Hospital course complicated by acute renal insufficiency with creatinine 2.51, A. fib with RVR, question angioedema from Invanz reaction and wound dehiscence. Patient's tracheostomy tube was downsized and later be cannulated 11/18/2013. Patient did develop some respiratory stridor placed on a prednisone taper x5 days  Hypoglycemic this morning asymptomatic Voice is stronger today denies shortness of breath chest pain abdominal pain Review of Systems - difficult to obtain secondary to disorientation and delayed response latency Objective: Vital Signs: Blood pressure 174/95, pulse 100, temperature 98.2 F (36.8 C), temperature source Oral, resp. rate 19, SpO2 100.00%. No results found. Results for orders placed during the hospital  encounter of 11/22/13 (from the past 72 hour(s))  PROTIME-INR     Status: None   Collection Time    11/22/13  7:04 PM      Result Value Ref Range   Prothrombin Time 13.1  11.6 - 15.2 seconds   INR 1.01  0.00 - 1.49  CBC WITH DIFFERENTIAL     Status: Abnormal   Collection Time    11/23/13  5:13 AM      Result Value Ref Range   WBC 5.3  4.0 - 10.5 K/uL   RBC 3.00 (*) 3.87 - 5.11 MIL/uL   Hemoglobin 8.7 (*) 12.0 - 15.0 g/dL   HCT 27.0 (*) 36.0 - 46.0 %   MCV 90.0  78.0 - 100.0 fL   MCH 29.0  26.0 - 34.0 pg   MCHC 32.2  30.0 - 36.0 g/dL   RDW 16.6 (*) 11.5 - 15.5 %   Platelets 159  150 - 400 K/uL   Neutrophils Relative % 67  43 - 77 %   Neutro Abs 3.5  1.7 - 7.7 K/uL   Lymphocytes Relative 20  12 - 46 %   Lymphs Abs 1.1  0.7 - 4.0 K/uL   Monocytes Relative 11  3 - 12 %   Monocytes Absolute 0.6  0.1 - 1.0 K/uL   Eosinophils Relative 2  0 - 5 %   Eosinophils Absolute 0.1  0.0 - 0.7 K/uL   Basophils Relative 0  0 - 1 %   Basophils Absolute 0.0  0.0 - 0.1 K/uL  COMPREHENSIVE METABOLIC PANEL     Status: Abnormal   Collection Time    11/23/13  5:13 AM      Result Value Ref Range   Sodium 140  137 - 147 mEq/L   Potassium 4.6  3.7 - 5.3 mEq/L   Chloride 100  96 - 112 mEq/L   CO2 27  19 - 32 mEq/L  Glucose, Bld 68 (*) 70 - 99 mg/dL   BUN 69 (*) 6 - 23 mg/dL   Creatinine, Ser 3.16 (*) 0.50 - 1.10 mg/dL   Calcium 9.8  8.4 - 10.5 mg/dL   Total Protein 6.2  6.0 - 8.3 g/dL   Albumin 2.6 (*) 3.5 - 5.2 g/dL   AST 12  0 - 37 U/L   ALT 22  0 - 35 U/L   Alkaline Phosphatase 97  39 - 117 U/L   Total Bilirubin 0.5  0.3 - 1.2 mg/dL   GFR calc non Af Amer 14 (*) >90 mL/min   GFR calc Af Amer 17 (*) >90 mL/min   Comment: (NOTE)     The eGFR has been calculated using the CKD EPI equation.     This calculation has not been validated in all clinical situations.     eGFR's persistently <90 mL/min signify possible Chronic Kidney     Disease.  PROTIME-INR     Status: None   Collection Time     11/23/13  5:13 AM      Result Value Ref Range   Prothrombin Time 13.4  11.6 - 15.2 seconds   INR 1.04  0.00 - 1.49  GLUCOSE, CAPILLARY     Status: Abnormal   Collection Time    11/23/13  9:22 PM      Result Value Ref Range   Glucose-Capillary 275 (*) 70 - 99 mg/dL  PROTIME-INR     Status: None   Collection Time    11/24/13  6:44 AM      Result Value Ref Range   Prothrombin Time 14.5  11.6 - 15.2 seconds   INR 1.15  0.00 - 1.49  GLUCOSE, CAPILLARY     Status: Abnormal   Collection Time    11/24/13  7:47 AM      Result Value Ref Range   Glucose-Capillary 58 (*) 70 - 99 mg/dL  GLUCOSE, CAPILLARY     Status: Abnormal   Collection Time    11/24/13  8:38 AM      Result Value Ref Range   Glucose-Capillary 52 (*) 70 - 99 mg/dL  GLUCOSE, CAPILLARY     Status: None   Collection Time    11/24/13  9:10 AM      Result Value Ref Range   Glucose-Capillary 71  70 - 99 mg/dL  GLUCOSE, CAPILLARY     Status: Abnormal   Collection Time    11/24/13 12:31 PM      Result Value Ref Range   Glucose-Capillary 122 (*) 70 - 99 mg/dL  GLUCOSE, CAPILLARY     Status: Abnormal   Collection Time    11/24/13  5:24 PM      Result Value Ref Range   Glucose-Capillary 196 (*) 70 - 99 mg/dL  GLUCOSE, CAPILLARY     Status: Abnormal   Collection Time    11/24/13  9:32 PM      Result Value Ref Range   Glucose-Capillary 150 (*) 70 - 99 mg/dL   Comment 1 Notify RN    PROTIME-INR     Status: None   Collection Time    11/25/13  5:32 AM      Result Value Ref Range   Prothrombin Time 15.1  11.6 - 15.2 seconds   INR 1.22  0.00 - 1.49  GLUCOSE, CAPILLARY     Status: None   Collection Time    11/25/13  7:18 AM  Result Value Ref Range   Glucose-Capillary 86  70 - 99 mg/dL     HEENT: healing trach site with air leak  General: No acute distress Mood and affect are appropriate Heart: Regular rate and rhythm no rubs murmurs or extra sounds Lungs: Clear to auscultation, breathing unlabored, no rales or  wheezes Abdomen: Positive bowel sounds, soft nontender to palpation, nondistended.  LLQ colostomy 3cm midline vertical wound Extremities: No clubbing, cyanosis, or edema Skin: No evidence of breakdown, no evidence of rash Neurologic: Cranial nerves II through XII intact, motor strength is 4/5 in bilateral deltoid, bicep, tricep, grip, hip flexor, knee extensors, ankle dorsiflexor and plantar flexor  Cerebellar exam normal finger to nose to finger on right, mild/mod dysmetria on left  Musculoskeletal:no pain with  Active range of motion in all 4 extremities. No joint swelling  Assessment/Plan: 1. Functional deficits secondary to Deconditioning and encephalopathy which require 3+ hours per day of interdisciplinary therapy in a comprehensive inpatient rehab setting. Physiatrist is providing close team supervision and 24 hour management of active medical problems listed below. Physiatrist and rehab team continue to assess barriers to discharge/monitor patient progress toward functional and medical goals. FIM: FIM - Bathing Bathing Steps Patient Completed: Chest;Right Arm;Left Arm;Abdomen;Right upper leg;Left upper leg Bathing: 3: Mod-Patient completes 5-7 40f10 parts or 50-74%  FIM - Upper Body Dressing/Undressing Upper body dressing/undressing steps patient completed: Thread/unthread right sleeve of pullover shirt/dresss;Thread/unthread left sleeve of pullover shirt/dress Upper body dressing/undressing: 3: Mod-Patient completed 50-74% of tasks FIM - Lower Body Dressing/Undressing Lower body dressing/undressing: 1: Total-Patient completed less than 25% of tasks  FIM - Toileting Toileting: 1: Total-Patient completed zero steps, helper did all 3  FIM - TRadio producerDevices: Sliding board Toilet Transfers: 1-Mechanical lift;1-Two helpers  FIM - BFinancial plannerTransfer: 2: Bed > Chair or W/C: Max  A (lift and lower assist);2: Chair or W/C > Bed: Max A (lift and lower assist)  FIM - Locomotion: Wheelchair Locomotion: Wheelchair: 1: Travels less than 50 ft with moderate assistance (Pt: 50 - 74%) FIM - Locomotion: Ambulation Locomotion: Ambulation: 0: Activity did not occur  Comprehension Comprehension Mode: Auditory Comprehension: 5-Understands basic 90% of the time/requires cueing < 10% of the time  Expression Expression Mode: Verbal Expression: 5-Expresses complex 90% of the time/cues < 10% of the time  Social Interaction Social Interaction: 5-Interacts appropriately 90% of the time - Needs monitoring or encouragement for participation or interaction.  Problem Solving Problem Solving: 3-Solves basic 50 - 74% of the time/requires cueing 25 - 49% of the time  Memory Memory: 3-Recognizes or recalls 50 - 74% of the time/requires cueing 25 - 49% of the time   Medical Problem List and Plan:  1. Functional deficits secondary to deconditioning related to respiratory failure multi-medical as above  2. DVT Prophylaxis/Anticoagulation: Subcutaneous heparin discontinued and chronic Coumadin resumed 11/22/2013 without bridging. . Doppler showed possible Left popliteal DVT in a technically limited study, no progressive symptoms Given slow rise in INR , start low dose SQ heparin 3. Pain Management: Oxycodone immediate release 5 mg every 6 hours as needed  4. Mood: Clonazepam 0.5 mg 3 times a day. Team to provide positive reinforcement  5. Neuropsych: This patient is capable of making decisions on her own behalf.  6. 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 Developed respiratory stridor placed on prednisone taper x5 days. Check oxygen saturations every shift and monitory closely as we wean.  7. Dysphagia. Mechanical soft diet.  Gastrostomy tube feeds on hold. Followup speech therapy on advancing diet  8. Diabetes mellitus. Glipizide 10 mg daily, below a.m. blood sugar reduce Levemir 9 units each  bedtime. Check blood sugars a.c. and at bedtime. Monitor closely while on prednisone. Titrate regimen as needed.  9. Hypertension/atrial fibrillation. Lasix 40 mg daily, Norvasc 10 mg daily, Coreg 6.25 mg twice a day  10. Hypothyroidism. Tapazole 5 mg daily. Latest TSH level 2.230  11. Chronic anemia. Last hgb 8.7 monitor, check stool guaic 12. Obstructive sleep apnea. Resume CPAP. Family to bring CPAP machine from home   LOS (Days) 3 A FACE TO FACE EVALUATION WAS PERFORMED  Charlett Blake 11/25/2013, 10:06 AM

## 2013-11-26 ENCOUNTER — Inpatient Hospital Stay (HOSPITAL_COMMUNITY): Payer: Medicare Other | Admitting: Speech Pathology

## 2013-11-26 ENCOUNTER — Inpatient Hospital Stay (HOSPITAL_COMMUNITY): Payer: Medicare Other

## 2013-11-26 ENCOUNTER — Inpatient Hospital Stay (HOSPITAL_COMMUNITY): Payer: Medicare Other | Admitting: Occupational Therapy

## 2013-11-26 DIAGNOSIS — E119 Type 2 diabetes mellitus without complications: Secondary | ICD-10-CM

## 2013-11-26 DIAGNOSIS — J9620 Acute and chronic respiratory failure, unspecified whether with hypoxia or hypercapnia: Secondary | ICD-10-CM

## 2013-11-26 DIAGNOSIS — R5381 Other malaise: Secondary | ICD-10-CM

## 2013-11-26 DIAGNOSIS — N289 Disorder of kidney and ureter, unspecified: Secondary | ICD-10-CM

## 2013-11-26 LAB — GLUCOSE, CAPILLARY
GLUCOSE-CAPILLARY: 145 mg/dL — AB (ref 70–99)
Glucose-Capillary: 62 mg/dL — ABNORMAL LOW (ref 70–99)
Glucose-Capillary: 104 mg/dL — ABNORMAL HIGH (ref 70–99)
Glucose-Capillary: 113 mg/dL — ABNORMAL HIGH (ref 70–99)
Glucose-Capillary: 196 mg/dL — ABNORMAL HIGH (ref 70–99)

## 2013-11-26 LAB — OCCULT BLOOD X 1 CARD TO LAB, STOOL: FECAL OCCULT BLD: NEGATIVE

## 2013-11-26 LAB — PROTIME-INR
INR: 1.28 (ref 0.00–1.49)
Prothrombin Time: 15.7 seconds — ABNORMAL HIGH (ref 11.6–15.2)

## 2013-11-26 MED ORDER — INSULIN DETEMIR 100 UNIT/ML ~~LOC~~ SOLN
5.0000 [IU] | Freq: Every day | SUBCUTANEOUS | Status: DC
  Administered 2013-11-26: 5 [IU] via SUBCUTANEOUS
  Filled 2013-11-26 (×3): qty 0.05

## 2013-11-26 MED ORDER — WARFARIN SODIUM 7.5 MG PO TABS
7.5000 mg | ORAL_TABLET | Freq: Once | ORAL | Status: AC
  Administered 2013-11-26: 7.5 mg via ORAL
  Filled 2013-11-26: qty 1

## 2013-11-26 NOTE — Significant Event (Signed)
Hypoglycemic Event  CBG: 62  Treatment: 15 GM carbohydrate snack  Symptoms: None  Follow-up CBG: Time 0920  CBG Result 104 Possible Reasons for Event: Unknown  Comments/MD notified md swortz          Sarah Tran  Remember to initiate Hypoglycemia Order Set & completeAdult Hypoglycemia Protocol Treatment Guidelines  1. RN shall initiate Hypoglycemia Protocol emergency measures immediately when:            w        Routine or STAT CBG and/or a lab glucose indicates hypoglycemia (CBG < 70 mg/dl)  2. Treat the patient according to ability to take PO's and severity of hypoglycemia.   3. If patient is on GlucoStabilizer, follow directions provided by the [facility]uc[facility] for hypoglycemic events.  4. If patient on insulin pump, follow Hypoglycemia Protocol.  If patient requires more than one treatment have patient place pump in SUSPEND and notify MD.  DO NOT leave pump in SUSPEND for greater than 30 minutes unless ordered by MD.  A. Treatment for Mild or Moderate-Patient cooperative and able to swallow    1.  Patient taking PO's and can cooperate   a.  Give one of the following 15 gram CHO options:                           w     1 tube oral dextrose gel                           w     3-4 Glucose tablets                           w     4 oz. Juice                           w     4 oz. regular soda                                    ESRD patients:  clear, regular soda                           w     8 oz. skim milk    b.  Recheck CBG in 15 minutes after treatment                            w       If CBG < 70 mg/dl, repeat treatment and recheck until hypoglycemia is resolved                            w       If CBG > 70 mg/dl and next meal is more than 1 hour away, give additional 15 grams CHO   2.  Patient NPO-Patient cooperative and no altered mental status    a.  Give 25 ml of D50 IV.   b.  Recheck CBG in 15 minutes after treatment.  w         If CBG is less than 70 mg/dl, repeat treatment and recheck until hypoglycemia is resolved.   c.  Notify MD for further orders.             SPECIAL CONSIDERATIONS:    a.  If no IV access,                              w        Start IV of D5W at [facility]                             w        Give 25 ml of D50 IV.    b.  If unable to gain IV access                             w          Give Glucagon IM:     i.  1 mg if patient weighs more than 45.5 kg     ii.  0.5 mg if patient weighs less than 45.5 kg   c.  Notify MD for further orders  B. Treatment for Severe-- Patient unconscious or unable to take PO's safely    1.  Position patient on side   2.  Give 50 ml D50 IV   3.  Recheck CBG in 15 minutes.                    w      If CBG is less than 70 mg/dl, repeat treatment and recheck until hypoglycemia is resolved.   4.  Notify MD for further orders.    SPECIAL CONSIDERATIONS:    a.  If no IV access                              w     Give Glucagon IM                                              i.  1 mg if patient weighs more than 45.5 kg                                             ii.  0.5 mg if patient weighs less than 45.5 kg                              w      Start IV of D5W at 50 ml/hr and give 50 ml D50 IV   b.  If no IV access and active seizure                               w       Call Rapid Response   c.  If unable to gain IV access, give Glucagon IM:  w          1 mg if patient weighs more than 45.5 kg                              w          0.5 mg if patient weighs less than 45.5 kg   d.  Notify MD for further orders.  C. Complete smart text progress note to document intervention and follow-up CBG   1. In [facility] patient chart, click on Notes (left side of screen)   2. Create Progress Note   3. Click on [employer].  In the Match box type "hypo" and enter    4. Double click on CHL IP HYPOGLYCEMIC EVENT and enter  data   5. MD must be notified if patient is NPO or experienced severe hypoglycemia

## 2013-11-26 NOTE — Progress Notes (Signed)
Blood sugar 62 this morning up to 104 after breakfast md notified  [place] written

## 2013-11-26 NOTE — Progress Notes (Addendum)
Physical Therapy Note  Patient Details  Name: Sarah Tran MRN: [id number] Date of Birth: Aug 14, 1945 Today's Date: 11/26/2013 9450-3888, 45 min 1500-1545, 45 min Individual therapy  Tx 1: No pain reported   focused on bed mobility, rolling L with mod assist, L with min assist in flat bed no rails.  SBT bed> w/c to L with set up to place board, mod/max assist to slightly uphill w/c, removal of w/c with tactile cues.  W/c propulsion using bil UEs to propel x 30' including turns to R.  Therapeutic exercise performed with LE to increase strength for functional mobility. In sitting, marching, bil hip add against resistance, bil ankeleumps to improve bed mobility and standing.  Scooting forward in w/c with close supervision;  Sit> stand at parallel bars from 23" high w.c +2 assistance with pt pulling on //, tolerated standing x 2 min 10 seconds during lateral wt shifting with cognitive task of answering questions about her family.  Pt with delayed processing of 1 step commands.  Tx 2: Pain R knee, unrated, with wt bearing; RN informed  Pt asleep, difficult to arouse.  Focused on therapeutic activities.    Pt stated she needed to use toilet.  With [facility] raised slightly, supine > sit with min assist; scooting forward on bed with supervision.  SBT to commode chair, slightly downhill, min guard > max assist to complete, with 2nd person for safety to stabilize commode chair   +2 SBT commode chair to w/c, slightly uphill, +2 assist.    Pt's brother Deniece Portela arrived; he observed tx.  Sit> stand outside of //,  Max assist of 1.    Standing endurance 2 min 20 seconds, during R><L lateral wt shifts, x 1 minute for R stance stability during L slight heel lifts.  Pt 's R knee becomes painful and weak which limits her standing.     Susa Loffler 11/26/2013, 4:11 PM

## 2013-11-26 NOTE — Progress Notes (Signed)
ANTICOAGULATION CONSULT NOTE - Follow Up Consult  Pharmacy Consult:  Coumadin Indication:  History of DVT and Afib  Allergies  Allergen Reactions  . Codeine Other (See Comments)    Pass out    . Peanut Butter Flavor Hives and Other (See Comments)    Lockjaw   . Penicillins Swelling    Patient Measurements: Height: 5' 2.99" (160 cm) Weight: 251 lb 1.7 oz (113.9 kg) IBW/kg (Calculated) : 52.38  Vital Signs: Temp: 97.8 F (36.6 C) (05/16 0537) Temp src: Axillary (05/16 0537) BP: 146/92 mmHg (05/16 0537) Pulse Rate: 90 (05/16 0537)  Labs:  Recent Labs  11/24/13 0644 11/25/13 0532 11/25/13 1211 11/26/13 0540  HGB  --   --  9.4*  --   HCT  --   --  29.5*  --   PLT  --   --  150  --   LABPROT 14.5 15.1  --  15.7*  INR 1.15 1.22  --  1.28    Estimated Creatinine Clearance: 21.6 ml/min (by C-G formula based on Cr of 3.16).     Assessment: 30 YOF with complicated hospital course now in Rehab.  Patient continues on Coumadin and SQ heparin for atrial fibrillation and history of DVT.  INR remains sub-therapeutic.  Noted that patient has chronic anemia; no bleeding reported.   Goal of Therapy:  INR 2-3 Monitor platelets by anticoagulation protocol: Yes    Plan:  - Coumadin 7.5mg  PO today - Continue heparin SQ until INR therapeutic - Daily PT / INR - F/U resume home meds    Mckaela Howley D. Laney Potash, PharmD, BCPS Pager:  [phone] 11/26/2013, 1:40 PM

## 2013-11-26 NOTE — Progress Notes (Signed)
Occupational Therapy Session Note  Patient Details  Name: Sarah Tran MRN: [id number] Date of Birth: 1946/07/10  Today's Date: 11/26/2013 Time: 1115-1230 Total Minutes:75 minutes   Skilled Therapeutic Interventions/Progress Updates: Patient very deconditioned and received skilled OT to complete the following:  toileting and transfers Max A x2;   UB bahting and dressing: mod A and LB bathing and dressing required 2 people to pull up her panties and pants.   Patient very SOB with all self care tasks today.   She expressed concern as to whether she will have anyone to help her with toileting after d/c while her dtr is at work.  Went in a 2nd time to offer adaptive equipment training but patient asleep and was very fatigued in the am session; so, this clinician did not disturb her.  On 3rd approach, brother and sister in law visiting so this clinician simply demonstrated and introduced adaptive equipment (long hand shoe horn, sock aide, reacher, elastic shoe laces) for LB dressing.  Patient nodded her head that she understood and her brother told her that she will get much better and probably not need the items but if she does, the family will make sure they are purchased for her.    Therapy Documentation Precautions:  Precautions Precautions: Fall Precaution Comments: Pt with colostomy, PEG, mid abdominal wound site, healing trach site with small air leak; previous R TKA Restrictions Weight Bearing Restrictions: No   Pain:denied     See FIM for current functional status  Therapy/Group: Individual Therapy  Rozelle Logan 11/26/2013, 4:45 PM

## 2013-11-26 NOTE — Progress Notes (Signed)
Speech Language Pathology Daily Session Note  Patient Details  Name: Tomico Gannaway MRN: [id number] Date of Birth: 1945-11-28  Today's Date: 11/26/2013 Time: 1884-1660 Time Calculation (min): 46 min  Short Term Goals: Week 1: SLP Short Term Goal 1 (Week 1): Pt will utilize safe swallowing strategies with recommended diet with supervision level cues SLP Short Term Goal 2 (Week 1): Pt will utilize environmental aids to demonstrate orientation x4 with Mod cues SLP Short Term Goal 3 (Week 1): Pt will demonstrate basic problem solving during familiar, functional tasks with Mod cues SLP Short Term Goal 4 (Week 1): Pt will verbalize at least at least 1 physical and 2 cognitive impairments with Mod cues SLP Short Term Goal 5 (Week 1): Pt will utilize exteral memory aids to facilitate recall of new and daily information with Mod cues SLP Short Term Goal 6 (Week 1): Pt will demonstrate sustained attention to familiar, functional task for 10 minutes with Mod cues  Skilled Therapeutic Interventions: Therapy for cognitive retraining complete, with short term goals addressed.  The patient was oriented x3, using external environmental cues independently.  She was able to maintain attention to basic functional tasks and games for greater than 25 minutes.  Basic problem solving tasks required min A cues as well.  Voice -low intensity but easily understood in quiet environment.  Continue with current plan of care.    FIM:  Comprehension Comprehension Mode: Auditory Comprehension: 5-Understands basic 90% of the time/requires cueing < 10% of the time Expression Expression Mode: Verbal Expression: 5-Expresses complex 90% of the time/cues < 10% of the time  Pain Pain Assessment Pain Assessment: No/denies pain  Therapy/Group: Individual Therapy  Wealthy Danielski L Lemuel Boodram 11/26/2013, 10:50 AM

## 2013-11-26 NOTE — Progress Notes (Signed)
Subjective/Complaints: 68 year old right-handed morbidly obese African American female. Past medical history of hypertension, renal insufficiency, obstructive sleep apnea with CPAP, DVT/A. fib with chronic Coumadin. Hospital course patient [facility]28/2013 for removal of a ileal polyp. Noted perioperative complications balloon catheter became dislodged. She required exploratory laparotomy and diverting colostomy as well as appendectomy secondary to perforated sigmoid colon. Her hospital was complicated by respiratory failure requiring intubation and eventually tracheostomy as well his gastrostomy tube for nutritional support. She remained in intensive care for 28 days. Patient developed sepsis cultures growing gram-positive cocci in clusters maintained on broad-spectrum antibiotics. [facility] stabilized [facility] further vent weaning and wound care. An echocardiogram is completed 10/28/2013 showing mild LVH with ejection fraction of 55-60%. 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 Hospital course complicated by acute renal insufficiency with creatinine 2.51, A. fib with RVR, question angioedema from Invanz reaction and wound dehiscence. Patient's tracheostomy tube was downsized and later be cannulated 11/18/2013. Patient did develop some respiratory stridor placed on a prednisone taper x5 days    Feeling stronger. Slept well last night. No issues with CPAP Review of Systems - otherwise negative Objective: Vital Signs: Blood pressure 146/92, pulse 90, temperature 97.8 F (36.6 C), temperature source Axillary, resp. rate 20, SpO2 100.00%. No results found. Results for orders placed during the hospital encounter of 11/22/13 (from the past 72 hour(s))  GLUCOSE, CAPILLARY     Status: Abnormal   Collection  Time    11/23/13  9:22 PM      Result Value Ref Range   Glucose-Capillary 275 (*) 70 - 99 mg/dL  PROTIME-INR     Status: None   Collection Time    11/24/13  6:44 AM      Result Value Ref Range   Prothrombin Time 14.5  11.6 - 15.2 seconds   INR 1.15  0.00 - 1.49  GLUCOSE, CAPILLARY     Status: Abnormal   Collection Time    11/24/13  7:47 AM      Result Value Ref Range   Glucose-Capillary 58 (*) 70 - 99 mg/dL  GLUCOSE, CAPILLARY     Status: Abnormal   Collection Time    11/24/13  8:38 AM      Result Value Ref Range   Glucose-Capillary 52 (*) 70 - 99 mg/dL  GLUCOSE, CAPILLARY     Status: None   Collection Time    11/24/13  9:10 AM      Result Value Ref Range   Glucose-Capillary 71  70 - 99 mg/dL  GLUCOSE, CAPILLARY     Status: Abnormal   Collection Time    11/24/13 12:31 PM      Result Value Ref Range   Glucose-Capillary 122 (*) 70 - 99 mg/dL  GLUCOSE, CAPILLARY     Status: Abnormal   Collection Time    11/24/13  5:24 PM      Result Value Ref Range   Glucose-Capillary 196 (*) 70 - 99 mg/dL  GLUCOSE, CAPILLARY     Status: Abnormal   Collection Time    11/24/13  9:32 PM      Result Value Ref Range   Glucose-Capillary 150 (*) 70 - 99 mg/dL   Comment 1 Notify RN    PROTIME-INR     Status: None   Collection Time    11/25/13  5:32 AM  Result Value Ref Range   Prothrombin Time 15.1  11.6 - 15.2 seconds   INR 1.22  0.00 - 1.49  GLUCOSE, CAPILLARY     Status: None   Collection Time    11/25/13  7:18 AM      Result Value Ref Range   Glucose-Capillary 86  70 - 99 mg/dL  GLUCOSE, CAPILLARY     Status: Abnormal   Collection Time    11/25/13 11:03 AM      Result Value Ref Range   Glucose-Capillary 110 (*) 70 - 99 mg/dL   Comment 1 Notify RN    CBC     Status: Abnormal   Collection Time    11/25/13 12:11 PM      Result Value Ref Range   WBC 6.8  4.0 - 10.5 K/uL   RBC 3.21 (*) 3.87 - 5.11 MIL/uL   Hemoglobin 9.4 (*) 12.0 - 15.0 g/dL   HCT 16.1 (*) 09.6 - 04.5 %    MCV 91.9  78.0 - 100.0 fL   MCH 29.3  26.0 - 34.0 pg   MCHC 31.9  30.0 - 36.0 g/dL   RDW 40.9 (*) 81.1 - 91.4 %   Platelets 150  150 - 400 K/uL  GLUCOSE, CAPILLARY     Status: Abnormal   Collection Time    11/25/13  4:17 PM      Result Value Ref Range   Glucose-Capillary 66 (*) 70 - 99 mg/dL   Comment 1 Notify RN    GLUCOSE, CAPILLARY     Status: Abnormal   Collection Time    11/25/13  4:51 PM      Result Value Ref Range   Glucose-Capillary 68 (*) 70 - 99 mg/dL   Comment 1 Notify RN    GLUCOSE, CAPILLARY     Status: Abnormal   Collection Time    11/25/13  5:46 PM      Result Value Ref Range   Glucose-Capillary 126 (*) 70 - 99 mg/dL   Comment 1 Notify RN    GLUCOSE, CAPILLARY     Status: Abnormal   Collection Time    11/25/13  9:05 PM      Result Value Ref Range   Glucose-Capillary 171 (*) 70 - 99 mg/dL  PROTIME-INR     Status: Abnormal   Collection Time    11/26/13  5:40 AM      Result Value Ref Range   Prothrombin Time 15.7 (*) 11.6 - 15.2 seconds   INR 1.28  0.00 - 1.49  GLUCOSE, CAPILLARY     Status: Abnormal   Collection Time    11/26/13  7:05 AM      Result Value Ref Range   Glucose-Capillary 62 (*) 70 - 99 mg/dL   Comment 1 Notify RN       HEENT: healing trach site with air leak  General: No acute distress Mood and affect are appropriate Heart: Regular rate and rhythm no rubs murmurs or extra sounds Lungs: Clear to auscultation, breathing unlabored, no rales or wheezes Abdomen: Positive bowel sounds, soft nontender to palpation, nondistended.  LLQ colostomy 3cm midline vertical wound Extremities: No clubbing, cyanosis, or edema Skin: No evidence of breakdown, no evidence of rash Neurologic: Cranial nerves II through XII intact, motor strength is 4/5 in bilateral deltoid, bicep, tricep, grip, hip flexor, knee extensors, ankle dorsiflexor and plantar flexor. normal finger to nose to finger on right, mild/mod dysmetria on left  Musculoskeletal:no pain with   Active  range of motion in all 4 extremities. No joint swelling  Assessment/Plan: 1. Functional deficits secondary to Deconditioning and encephalopathy which require 3+ hours per day of interdisciplinary therapy in a comprehensive inpatient rehab setting. Physiatrist is providing close team supervision and 24 hour management of active medical problems listed below. Physiatrist and rehab team continue to assess barriers to discharge/monitor patient progress toward functional and medical goals. FIM: FIM - Bathing Bathing Steps Patient Completed: Chest;Right Arm;Left Arm;Abdomen;Right upper leg;Left upper leg Bathing: 3: Mod-Patient completes 5-7 44f 10 parts or 50-74%  FIM - Upper Body Dressing/Undressing Upper body dressing/undressing steps patient completed: Thread/unthread right sleeve of pullover shirt/dresss;Thread/unthread left sleeve of pullover shirt/dress Upper body dressing/undressing: 3: Mod-Patient completed 50-74% of tasks FIM - Lower Body Dressing/Undressing Lower body dressing/undressing: 1: Total-Patient completed less than 25% of tasks (Pt. unable to reach feet or cross legs at this time)  FIM - Toileting Toileting: 1: Total-Patient completed zero steps, helper did all 3  FIM - Materials engineer Transfers:  (used bed pan due to fatique)  FIM - Architectural technologist Transfer: 1: Two helpers;2: Bed > Chair or W/C: Max A (lift and lower assist)  FIM - Locomotion: Wheelchair Locomotion: Wheelchair: 0: Activity did not occur FIM - Locomotion: Ambulation Locomotion: Ambulation: 0: Activity did not occur  Comprehension Comprehension Mode: Auditory Comprehension: 5-Understands basic 90% of the time/requires cueing < 10% of the time  Expression Expression Mode: Verbal Expression: 5-Expresses complex 90% of the time/cues < 10% of the time  Social  Interaction Social Interaction: 5-Interacts appropriately 90% of the time - Needs monitoring or encouragement for participation or interaction.  Problem Solving Problem Solving: 3-Solves basic 50 - 74% of the time/requires cueing 25 - 49% of the time  Memory Memory: 3-Recognizes or recalls 50 - 74% of the time/requires cueing 25 - 49% of the time   Medical Problem List and Plan:  1. Functional deficits secondary to deconditioning related to respiratory failure multi-medical as above  2. DVT Prophylaxis/Anticoagulation: Subcutaneous heparin discontinued and chronic Coumadin resumed 11/22/2013 without bridging. . Doppler showed possible Left popliteal DVT in a technically limited study, no progressive symptoms. Continue low dose SQ heparin until INR therapeutic 3. Pain Management: Oxycodone immediate release 5 mg every 6 hours as needed  4. Mood: Clonazepam 0.5 mg 3 times a day. positive reinforcement  5. Neuropsych: This patient is capable of making decisions on her own behalf.  6. 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 Developed respiratory stridor placed on prednisone taper.  7. Dysphagia. Mechanical soft diet. Gastrostomy tube feeds on hold. Followup speech therapy on advancing diet  8. Diabetes mellitus. Glipizide 10 mg daily, below a.m. blood sugar reduce Levemir 9 units each bedtime. Checking blood sugars a.c. and at bedtime. Sugars low in am. Decrease levemir to 5u tonight.  9. Hypertension/atrial fibrillation. Lasix 40 mg daily, Norvasc 10 mg daily, Coreg 6.25 mg twice a day  10. Hypothyroidism. Tapazole 5 mg daily. Latest TSH level 2.230  11. Chronic anemia. Last hgb 8.7 monitor, check stool guaic 12. Obstructive sleep apnea. Resume CPAP.--pt seems to be tolerating well  LOS (Days) 4 A FACE TO FACE EVALUATION WAS PERFORMED  Ranelle Oyster 11/26/2013, 8:38 AM

## 2013-11-27 LAB — GLUCOSE, CAPILLARY
GLUCOSE-CAPILLARY: 82 mg/dL (ref 70–99)
GLUCOSE-CAPILLARY: 69 mg/dL — AB (ref 70–99)
Glucose-Capillary: 92 mg/dL (ref 70–99)
Glucose-Capillary: 166 mg/dL — ABNORMAL HIGH (ref 70–99)
Glucose-Capillary: 77 mg/dL (ref 70–99)

## 2013-11-27 LAB — PROTIME-INR
INR: 1.44 (ref 0.00–1.49)
PROTHROMBIN TIME: 17.2 s — AB (ref 11.6–15.2)

## 2013-11-27 MED ORDER — WARFARIN SODIUM 7.5 MG PO TABS
7.5000 mg | ORAL_TABLET | Freq: Once | ORAL | Status: AC
  Administered 2013-11-27: 7.5 mg via ORAL
  Filled 2013-11-27: qty 1

## 2013-11-27 NOTE — Significant Event (Signed)
Hypoglycemic Event  CBG: 69  Treatment: 15 GM carbohydrate snack  Symptoms: None  Follow-up CBG: Time:2150 CBG Result:77  Possible Reasons for Event: Unknown  Comments/MD notified:    Beatrix Shipper  Remember to initiate Hypoglycemia Order Set & complete

## 2013-11-27 NOTE — Progress Notes (Signed)
ANTICOAGULATION CONSULT NOTE - Follow Up Consult  Pharmacy Consult:  Coumadin Indication:  History of DVT and Afib  Allergies  Allergen Reactions  . Codeine Other (See Comments)    Pass out    . Peanut Butter Flavor Hives and Other (See Comments)    Lockjaw   . Penicillins Swelling    Patient Measurements: Height: 5' 2.99" (160 cm) Weight: 251 lb 1.7 oz (113.9 kg) IBW/kg (Calculated) : 52.38  Vital Signs: Temp: 98 F (36.7 C) (05/17 0538) Temp src: Oral (05/17 0538) BP: 144/86 mmHg (05/17 0538) Pulse Rate: 85 (05/17 0538)  Labs:  Recent Labs  11/25/13 0532 11/25/13 1211 11/26/13 0540 11/27/13 0601  HGB  --  9.4*  --   --   HCT  --  29.5*  --   --   PLT  --  150  --   --   LABPROT 15.1  --  15.7* 17.2*  INR 1.22  --  1.28 1.44    Estimated Creatinine Clearance: 21.6 ml/min (by C-G formula based on Cr of 3.16).     Assessment: 47 YOF with complicated hospital course now in Rehab.  Patient continues on Coumadin and SQ heparin for atrial fibrillation and history of DVT.  INR remains sub-therapeutic.  Noted that patient has chronic anemia; no bleeding reported.   Goal of Therapy:  INR 2-3 Monitor platelets by anticoagulation protocol: Yes    Plan:  - Repeat Coumadin 7.5mg  PO today - Continue heparin SQ until INR therapeutic - Daily PT / INR - F/U resume home meds - ?Consider changing NTG ointment to patch    Renji Berwick D. Laney Potash, PharmD, BCPS Pager:  [phone] 11/27/2013, 9:37 AM

## 2013-11-27 NOTE — Progress Notes (Addendum)
Subjective/Complaints: 68 year old right-handed morbidly obese African American female. Past medical history of hypertension, renal insufficiency, obstructive sleep apnea with CPAP, DVT/A. fib with chronic Coumadin. Hospital course patient admitted to [facility] 09/28/2013 for removal of a ileal polyp. Noted perioperative complications balloon catheter became dislodged. She required exploratory laparotomy and diverting colostomy as well as appendectomy secondary to perforated sigmoid colon. Her hospital was complicated by respiratory failure requiring intubation and eventually tracheostomy as well his gastrostomy tube for nutritional support. She remained in intensive care for 28 days. Patient developed sepsis cultures growing gram-positive cocci in clusters maintained on broad-spectrum antibiotics. [facility] further vent weaning and wound care. An echocardiogram is completed 10/28/2013 showing mild LVH with ejection fraction of 55-60%. 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 Hospital course complicated by acute renal insufficiency with creatinine 2.51, A. fib with RVR, question angioedema from Invanz reaction and wound dehiscence. Patient's tracheostomy tube was downsized and later be cannulated 11/18/2013. Patient did develop some respiratory stridor placed on a prednisone taper x5 days    Slept well. Right thigh is sore this am. Was busy with therapy yesterday Review of Systems - otherwise negative Objective: Vital Signs: Blood pressure 144/86, pulse 85, temperature 98 F (36.7 C), temperature source Oral, resp. rate 20, height 5' 2.99" (1.6 m), weight 113.9 kg (251 lb 1.7 oz), SpO2 97.00%. No results found. Results for orders placed during the hospital encounter of 11/22/13 (from the past 72  hour(s))  GLUCOSE, CAPILLARY     Status: Abnormal   Collection Time    11/24/13  8:38 AM      Result Value Ref Range   Glucose-Capillary 52 (*) 70 - 99 mg/dL  GLUCOSE, CAPILLARY     Status: None   Collection Time    11/24/13  9:10 AM      Result Value Ref Range   Glucose-Capillary 71  70 - 99 mg/dL  GLUCOSE, CAPILLARY     Status: Abnormal   Collection Time    11/24/13 12:31 PM      Result Value Ref Range   Glucose-Capillary 122 (*) 70 - 99 mg/dL  GLUCOSE, CAPILLARY     Status: Abnormal   Collection Time    11/24/13  5:24 PM      Result Value Ref Range   Glucose-Capillary 196 (*) 70 - 99 mg/dL  GLUCOSE, CAPILLARY     Status: Abnormal   Collection Time    11/24/13  9:32 PM      Result Value Ref Range   Glucose-Capillary 150 (*) 70 - 99 mg/dL   Comment 1 Notify RN    PROTIME-INR     Status: None   Collection Time    11/25/13  5:32 AM      Result Value Ref Range   Prothrombin Time 15.1  11.6 - 15.2 seconds   INR 1.22  0.00 - 1.49  GLUCOSE, CAPILLARY     Status: None   Collection Time    11/25/13  7:18 AM      Result Value Ref Range   Glucose-Capillary 86  70 - 99 mg/dL  GLUCOSE, CAPILLARY     Status: Abnormal   Collection Time    11/25/13 11:03 AM      Result Value Ref Range   Glucose-Capillary 110 (*) 70 - 99 mg/dL   Comment 1 Notify  RN    CBC     Status: Abnormal   Collection Time    11/25/13 12:11 PM      Result Value Ref Range   WBC 6.8  4.0 - 10.5 K/uL   RBC 3.21 (*) 3.87 - 5.11 MIL/uL   Hemoglobin 9.4 (*) 12.0 - 15.0 g/dL   HCT 16.1 (*) 09.6 - 04.5 %   MCV 91.9  78.0 - 100.0 fL   MCH 29.3  26.0 - 34.0 pg   MCHC 31.9  30.0 - 36.0 g/dL   RDW 40.9 (*) 81.1 - 91.4 %   Platelets 150  150 - 400 K/uL  GLUCOSE, CAPILLARY     Status: Abnormal   Collection Time    11/25/13  4:17 PM      Result Value Ref Range   Glucose-Capillary 66 (*) 70 - 99 mg/dL   Comment 1 Notify RN    GLUCOSE, CAPILLARY     Status: Abnormal   Collection Time    11/25/13  4:51 PM       Result Value Ref Range   Glucose-Capillary 68 (*) 70 - 99 mg/dL   Comment 1 Notify RN    GLUCOSE, CAPILLARY     Status: Abnormal   Collection Time    11/25/13  5:46 PM      Result Value Ref Range   Glucose-Capillary 126 (*) 70 - 99 mg/dL   Comment 1 Notify RN    GLUCOSE, CAPILLARY     Status: Abnormal   Collection Time    11/25/13  9:05 PM      Result Value Ref Range   Glucose-Capillary 171 (*) 70 - 99 mg/dL  PROTIME-INR     Status: Abnormal   Collection Time    11/26/13  5:40 AM      Result Value Ref Range   Prothrombin Time 15.7 (*) 11.6 - 15.2 seconds   INR 1.28  0.00 - 1.49  GLUCOSE, CAPILLARY     Status: Abnormal   Collection Time    11/26/13  7:05 AM      Result Value Ref Range   Glucose-Capillary 62 (*) 70 - 99 mg/dL   Comment 1 Notify RN    GLUCOSE, CAPILLARY     Status: Abnormal   Collection Time    11/26/13  9:26 AM      Result Value Ref Range   Glucose-Capillary 104 (*) 70 - 99 mg/dL   Comment 1 Notify RN    GLUCOSE, CAPILLARY     Status: Abnormal   Collection Time    11/26/13 11:28 AM      Result Value Ref Range   Glucose-Capillary 113 (*) 70 - 99 mg/dL   Comment 1 Notify RN    GLUCOSE, CAPILLARY     Status: Abnormal   Collection Time    11/26/13  4:25 PM      Result Value Ref Range   Glucose-Capillary 196 (*) 70 - 99 mg/dL   Comment 1 Notify RN    OCCULT BLOOD X 1 CARD TO LAB, STOOL     Status: None   Collection Time    11/26/13  7:48 PM      Result Value Ref Range   Fecal Occult Bld NEGATIVE  NEGATIVE  GLUCOSE, CAPILLARY     Status: Abnormal   Collection Time    11/26/13  8:33 PM      Result Value Ref Range   Glucose-Capillary 145 (*) 70 - 99 mg/dL  PROTIME-INR  Status: Abnormal   Collection Time    11/27/13  6:01 AM      Result Value Ref Range   Prothrombin Time 17.2 (*) 11.6 - 15.2 seconds   INR 1.44  0.00 - 1.49  GLUCOSE, CAPILLARY     Status: None   Collection Time    11/27/13  7:27 AM      Result Value Ref Range   Glucose-Capillary  82  70 - 99 mg/dL   Comment 1 Notify RN       HEENT: healing trach site with air leak  General: No acute distress Mood and affect are appropriate Heart: Regular rate and rhythm no rubs murmurs or extra sounds Lungs: Clear to auscultation, breathing unlabored, no rales or wheezes Abdomen: Positive bowel sounds, soft nontender to palpation, nondistended.  LLQ colostomy 3cm midline vertical wound Extremities: No clubbing, cyanosis, or edema Skin: No evidence of breakdown, no evidence of rash Neurologic: Cranial nerves II through XII intact, motor strength is 4/5 in bilateral deltoid, bicep, tricep, grip, hip flexor, knee extensors, ankle dorsiflexor and plantar flexor. normal finger to nose to finger on right, mild/mod dysmetria on left  Musculoskeletal: mild tenderness with palpation of right anterior thigh  Assessment/Plan: 1. Functional deficits secondary to Deconditioning and encephalopathy which require 3+ hours per day of interdisciplinary therapy in a comprehensive inpatient rehab setting. Physiatrist is providing close team supervision and 24 hour management of active medical problems listed below. Physiatrist and rehab team continue to assess barriers to discharge/monitor patient progress toward functional and medical goals. FIM: FIM - Bathing Bathing Steps Patient Completed: Chest;Right Arm;Left Arm;Abdomen;Right upper leg;Left upper leg Bathing: 3: Mod-Patient completes 5-7 665f 10 parts or 50-74%  FIM - Upper Body Dressing/Undressing Upper body dressing/undressing steps patient completed: Thread/unthread right sleeve of pullover shirt/dresss;Thread/unthread left sleeve of pullover shirt/dress Upper body dressing/undressing: 3: Mod-Patient completed 50-74% of tasks FIM - Lower Body Dressing/Undressing Lower body dressing/undressing: 1: Two helpers  FIM - Toileting Toileting steps completed by patient: Performs perineal hygiene Toileting: 1: Two helpers  FIM - Ambulance personToilet  Transfers Toilet Transfers Assistive Devices: Human resources officerBedside commode;Sliding board Toilet Transfers: 1-Two helpers  FIM - Architectural technologistBed/Chair Transfer Bed/Chair Transfer Assistive Devices: Sliding board Bed/Chair Transfer: 1: Two helpers  FIM - Locomotion: Wheelchair Distance: 30 Locomotion: Wheelchair: 2: Travels 50 - 149 ft with minimal assistance (Pt.>75%) FIM - Locomotion: Ambulation Locomotion: Ambulation: 0: Activity did not occur  Comprehension Comprehension Mode: Auditory Comprehension: 5-Understands basic 90% of the time/requires cueing < 10% of the time  Expression Expression Mode: Verbal Expression: 5-Expresses complex 90% of the time/cues < 10% of the time  Social Interaction Social Interaction: 5-Interacts appropriately 90% of the time - Needs monitoring or encouragement for participation or interaction.  Problem Solving Problem Solving: 3-Solves basic 50 - 74% of the time/requires cueing 25 - 49% of the time  Memory Memory: 3-Recognizes or recalls 50 - 74% of the time/requires cueing 25 - 49% of the time   Medical Problem List and Plan:  1. Functional deficits secondary to deconditioning related to respiratory failure multi-medical as above  2. DVT Prophylaxis/Anticoagulation: Subcutaneous heparin discontinued and chronic Coumadin resumed 11/22/2013 without bridging. . Doppler showed possible Left popliteal DVT in a technically limited study, no progressive symptoms. Continue low dose SQ heparin until INR therapeutic 3. Pain Management: Oxycodone immediate release 5 mg every 6 hours as needed. Added kpad for right thigh 4. Mood: Clonazepam 0.5 mg 3 times a day. positive reinforcement  5. Neuropsych: This patient is  capable of making decisions on her own behalf.  6. 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 Developed respiratory stridor placed on prednisone taper.  7. Dysphagia. Mechanical soft diet. Gastrostomy tube feeds on hold. Followup speech therapy on advancing diet  8.  Diabetes mellitus. Glipizide 10 mg daily, below a.m. blood sugar reduce Levemir 9 units each bedtime. Checking blood sugars a.c. and at bedtime. Sugars low in am. Decreased levemir to 5u qhs with cbg of 82 this am.  9. Hypertension/atrial fibrillation. Lasix 40 mg daily, Norvasc 10 mg daily, Coreg 6.25 mg twice a day  10. Hypothyroidism. Tapazole 5 mg daily. Latest TSH level 2.230  11. Chronic anemia. Last hgb 8.7 monitor, check stool guaic 12. Obstructive sleep apnea. Resumed CPAP.--pt seems to be tolerating well  LOS (Days) 5 A FACE TO FACE EVALUATION WAS PERFORMED  Ranelle Oyster 11/27/2013, 8:35 AM

## 2013-11-28 ENCOUNTER — Inpatient Hospital Stay (HOSPITAL_COMMUNITY): Payer: Medicare Other

## 2013-11-28 ENCOUNTER — Inpatient Hospital Stay (HOSPITAL_COMMUNITY): Payer: Medicare Other | Admitting: *Deleted

## 2013-11-28 ENCOUNTER — Inpatient Hospital Stay (HOSPITAL_COMMUNITY): Payer: Medicare Other | Admitting: Speech Pathology

## 2013-11-28 ENCOUNTER — Inpatient Hospital Stay (HOSPITAL_COMMUNITY): Payer: Medicare Other | Admitting: Occupational Therapy

## 2013-11-28 DIAGNOSIS — N289 Disorder of kidney and ureter, unspecified: Secondary | ICD-10-CM

## 2013-11-28 DIAGNOSIS — J9620 Acute and chronic respiratory failure, unspecified whether with hypoxia or hypercapnia: Secondary | ICD-10-CM

## 2013-11-28 DIAGNOSIS — E119 Type 2 diabetes mellitus without complications: Secondary | ICD-10-CM

## 2013-11-28 DIAGNOSIS — R5381 Other malaise: Secondary | ICD-10-CM

## 2013-11-28 LAB — CBC
HEMATOCRIT: 26.9 % — AB (ref 36.0–46.0)
Hemoglobin: 8.7 g/dL — ABNORMAL LOW (ref 12.0–15.0)
MCH: 29.6 pg (ref 26.0–34.0)
MCHC: 32.3 g/dL (ref 30.0–36.0)
MCV: 91.5 fL (ref 78.0–100.0)
Platelets: 127 10*3/uL — ABNORMAL LOW (ref 150–400)
RBC: 2.94 MIL/uL — ABNORMAL LOW (ref 3.87–5.11)
RDW: 16.6 % — AB (ref 11.5–15.5)
WBC: 5.6 10*3/uL (ref 4.0–10.5)

## 2013-11-28 LAB — GLUCOSE, CAPILLARY
GLUCOSE-CAPILLARY: 66 mg/dL — AB (ref 70–99)
GLUCOSE-CAPILLARY: 90 mg/dL (ref 70–99)
Glucose-Capillary: 78 mg/dL (ref 70–99)
Glucose-Capillary: 96 mg/dL (ref 70–99)

## 2013-11-28 LAB — PROTIME-INR
INR: 1.84 — AB (ref 0.00–1.49)
Prothrombin Time: 20.7 seconds — ABNORMAL HIGH (ref 11.6–15.2)

## 2013-11-28 LAB — OCCULT BLOOD X 1 CARD TO LAB, STOOL: Fecal Occult Bld: NEGATIVE

## 2013-11-28 MED ORDER — WARFARIN SODIUM 7.5 MG PO TABS
7.5000 mg | ORAL_TABLET | Freq: Once | ORAL | Status: AC
  Administered 2013-11-28: 7.5 mg via ORAL
  Filled 2013-11-28: qty 1

## 2013-11-28 NOTE — Progress Notes (Signed)
Speech Language Pathology Daily Session Note  Patient Details  Name: Shizuka Eike MRN: [id number] Date of Birth: [birth year]  Today's Date: 11/28/2013 Time: 6168-3729 Time Calculation (min): 45 min  Short Term Goals: Week 1: SLP Short Term Goal 1 (Week 1): Pt will utilize safe swallowing strategies with recommended diet with supervision level cues SLP Short Term Goal 2 (Week 1): Pt will utilize environmental aids to demonstrate orientation x4 with Mod cues SLP Short Term Goal 3 (Week 1): Pt will demonstrate basic problem solving during familiar, functional tasks with Mod cues SLP Short Term Goal 4 (Week 1): Pt will verbalize at least at least 1 physical and 2 cognitive impairments with Mod cues SLP Short Term Goal 5 (Week 1): Pt will utilize exteral memory aids to facilitate recall of new and daily information with Mod cues SLP Short Term Goal 6 (Week 1): Pt will demonstrate sustained attention to familiar, functional task for 10 minutes with Mod cues  Skilled Therapeutic Interventions: Pt was seen for skilled speech therapy targeting memory and dysphagia management.  Upon arrival, pt was awake, alert, and seated upright in wheelchair with significantly improved endurance in comparison to previous treatment sessions.  Pt was observed with trials of upgraded regular solid textures with no overt s/s of aspiration.  Furthermore, pt was noted with adequate oral manipulation, mastication, and clearance of solids.  As a result, recommend a diet upgrade to regular solids with continued thin liquids.  Pt's brother was present for last half of therapy session and was provided with education regarding current goals, progress in therapy, recommendations for a diet upgrade and swallowing precautions.  Pt and pt's brother verbalized understanding.  Pt recalled 1 of 2 structured therapy tasks from Friday's tx session with modified independence, improving to 2/2 with min-mod verbal and visual cues.  SLP reviewed  compensatory strategies for memory from previous therapy sessions with immediate recall of  strategies independently, improving to 4/4 with min assist.     FIM:  Comprehension Comprehension Mode: Auditory Comprehension: 5-Understands complex 90% of the time/Cues < 10% of the time Expression Expression Mode: Verbal Expression: 5-Expresses complex 90% of the time/cues < 10% of the time Social Interaction Social Interaction: 6-Interacts appropriately with others with medication or extra time (anti-anxiety, antidepressant). Problem Solving Problem Solving: 4-Solves basic 75 - 89% of the time/requires cueing 10 - 24% of the time Memory Memory: 3-Recognizes or recalls 50 - 74% of the time/requires cueing 25 - 49% of the time FIM - Eating Eating Activity: 5: Set-up assist for open containers  Pain Pain Assessment Pain Assessment: No/denies pain  Therapy/Group: Individual Therapy  Jackalyn Lombard, M.A. CCC-SLP  Melanee Spry Matteus Mcnelly  11/28/2013, 4:27 PM

## 2013-11-28 NOTE — Progress Notes (Addendum)
Inpatient Diabetes Program Recommendations  AACE/ADA: New Consensus Statement on Inpatient Glycemic Control (2013)  Target Ranges:  Prepandial:   less than 140 mg/dL      Peak postprandial:   less than 180 mg/dL (1-2 hours)      Critically ill patients:  140 - 180 mg/dL   Inpatient Diabetes Program Recommendations Insulin - Basal: xxxxx Correction (SSI): Please check CBG's tidwc and HS. Pt is on insulin and oral sulfonylurea. Lab glucose this am at 68 and no further glucose testing ordered. Oral Agents: Please do not use oral agent glipizide while in the hospital as po intake may be unpredictable and variable.  Noted Levemir insulin discontinued today most probably due to hypoglycemia this am. However, Pt was having hypoglycemia before the levemir was begun. Again, the Glipizide is causing some low cbg's. Please decrease to 5 mg Glipizide per day if continued. Pt's BUN/Cr are elevated indicating renal issues.  The glipizide is not clearing the system per the kidneys as is normal. Preference while here would be to use the correction-sensitive scale which begins at 121 mg/dL and decrease the likelihood of hypoglycemia.  Thank you, Lenor Coffin, RN, CNS, Diabetes Coordinator [phone])

## 2013-11-28 NOTE — Progress Notes (Addendum)
Subjective/Complaints: 68 year old right-handed morbidly obese African American female. Past medical history of hypertension, renal insufficiency, obstructive sleep apnea with CPAP, DVT/A. fib with chronic Coumadin. Hospital course patient admitted to [facility] 09/28/2013 for removal of a ileal polyp. Noted perioperative complications balloon catheter became dislodged. She required exploratory laparotomy and diverting colostomy as well as appendectomy secondary to perforated sigmoid colon. Her hospital was complicated by respiratory failure requiring intubation and eventually tracheostomy as well his gastrostomy tube for nutritional support. She remained in intensive care for 28 days. Patient developed sepsis cultures growing gram-positive cocci in clusters maintained on broad-spectrum antibiotics. [facility] stabilized [facility] for further vent weaning and wound care. An echocardiogram is completed 10/28/2013 showing mild LVH with ejection fraction of 55-60%. 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 Hospital course complicated by acute renal insufficiency with creatinine 2.51, A. fib with RVR, question angioedema from Invanz reaction and wound dehiscence. Patient's tracheostomy tube was downsized and later be cannulated 11/18/2013.  Slept well. Right thigh is sore this am. Was busy with therapy yesterday Review of Systems - otherwise negative Objective: Vital Signs: Blood pressure 145/87, pulse 76, temperature 97.7 F (36.5 C), temperature source Oral, resp. rate 19, height 5' 2.99" (1.6 m), weight 113.9 kg (251 lb 1.7 oz), SpO2 99.00%. No results found. Results for orders placed during the hospital encounter of 11/22/13 (from the past 72 hour(s))  GLUCOSE, CAPILLARY     Status: Abnormal   Collection Time    11/25/13  11:03 AM      Result Value Ref Range   Glucose-Capillary 110 (*) 70 - 99 mg/dL   Comment 1 Notify RN    CBC     Status: Abnormal   Collection Time    11/25/13 12:11 PM      Result Value Ref Range   WBC 6.8  4.0 - 10.5 K/uL   RBC 3.21 (*) 3.87 - 5.11 MIL/uL   Hemoglobin 9.4 (*) 12.0 - 15.0 g/dL   HCT 19.1 (*) 47.8 - 29.5 %   MCV 91.9  78.0 - 100.0 fL   MCH 29.3  26.0 - 34.0 pg   MCHC 31.9  30.0 - 36.0 g/dL   RDW 62.1 (*) 30.8 - 65.7 %   Platelets 150  150 - 400 K/uL  GLUCOSE, CAPILLARY     Status: Abnormal   Collection Time    11/25/13  4:17 PM      Result Value Ref Range   Glucose-Capillary 66 (*) 70 - 99 mg/dL   Comment 1 Notify RN    GLUCOSE, CAPILLARY     Status: Abnormal   Collection Time    11/25/13  4:51 PM      Result Value Ref Range   Glucose-Capillary 68 (*) 70 - 99 mg/dL   Comment 1 Notify RN    GLUCOSE, CAPILLARY     Status: Abnormal   Collection Time    11/25/13  5:46 PM      Result Value Ref Range   Glucose-Capillary 126 (*) 70 - 99 mg/dL   Comment 1 Notify RN    GLUCOSE, CAPILLARY     Status: Abnormal   Collection Time    11/25/13  9:05 PM      Result Value Ref Range   Glucose-Capillary 171 (*) 70 - 99 mg/dL  PROTIME-INR     Status: Abnormal  Collection Time    11/26/13  5:40 AM      Result Value Ref Range   Prothrombin Time 15.7 (*) 11.6 - 15.2 seconds   INR 1.28  0.00 - 1.49  GLUCOSE, CAPILLARY     Status: Abnormal   Collection Time    11/26/13  7:05 AM      Result Value Ref Range   Glucose-Capillary 62 (*) 70 - 99 mg/dL   Comment 1 Notify RN    GLUCOSE, CAPILLARY     Status: Abnormal   Collection Time    11/26/13  9:26 AM      Result Value Ref Range   Glucose-Capillary 104 (*) 70 - 99 mg/dL   Comment 1 Notify RN    GLUCOSE, CAPILLARY     Status: Abnormal   Collection Time    11/26/13 11:28 AM      Result Value Ref Range   Glucose-Capillary 113 (*) 70 - 99 mg/dL   Comment 1 Notify RN    GLUCOSE, CAPILLARY     Status: Abnormal    Collection Time    11/26/13  4:25 PM      Result Value Ref Range   Glucose-Capillary 196 (*) 70 - 99 mg/dL   Comment 1 Notify RN    OCCULT BLOOD X 1 CARD TO LAB, STOOL     Status: None   Collection Time    11/26/13  7:48 PM      Result Value Ref Range   Fecal Occult Bld NEGATIVE  NEGATIVE  GLUCOSE, CAPILLARY     Status: Abnormal   Collection Time    11/26/13  8:33 PM      Result Value Ref Range   Glucose-Capillary 145 (*) 70 - 99 mg/dL  PROTIME-INR     Status: Abnormal   Collection Time    11/27/13  6:01 AM      Result Value Ref Range   Prothrombin Time 17.2 (*) 11.6 - 15.2 seconds   INR 1.44  0.00 - 1.49  GLUCOSE, CAPILLARY     Status: None   Collection Time    11/27/13  7:27 AM      Result Value Ref Range   Glucose-Capillary 82  70 - 99 mg/dL   Comment 1 Notify RN    GLUCOSE, CAPILLARY     Status: None   Collection Time    11/27/13 11:11 AM      Result Value Ref Range   Glucose-Capillary 92  70 - 99 mg/dL   Comment 1 Notify RN    GLUCOSE, CAPILLARY     Status: Abnormal   Collection Time    11/27/13  4:11 PM      Result Value Ref Range   Glucose-Capillary 166 (*) 70 - 99 mg/dL   Comment 1 Notify RN    GLUCOSE, CAPILLARY     Status: Abnormal   Collection Time    11/27/13  9:14 PM      Result Value Ref Range   Glucose-Capillary 69 (*) 70 - 99 mg/dL   Comment 1 Notify RN    GLUCOSE, CAPILLARY     Status: None   Collection Time    11/27/13  9:48 PM      Result Value Ref Range   Glucose-Capillary 77  70 - 99 mg/dL  PROTIME-INR     Status: Abnormal   Collection Time    11/28/13  6:34 AM      Result Value Ref Range   Prothrombin Time  20.7 (*) 11.6 - 15.2 seconds   INR 1.84 (*) 0.00 - 1.49  GLUCOSE, CAPILLARY     Status: Abnormal   Collection Time    11/28/13  7:26 AM      Result Value Ref Range   Glucose-Capillary 66 (*) 70 - 99 mg/dL   Comment 1 Notify RN    GLUCOSE, CAPILLARY     Status: None   Collection Time    11/28/13  7:53 AM      Result Value Ref  Range   Glucose-Capillary 78  70 - 99 mg/dL   Comment 1 Notify RN       HEENT: healing trach site with air leak  General: No acute distress Mood and affect are appropriate Heart: Regular rate and rhythm no rubs murmurs or extra sounds Lungs: Clear to auscultation, breathing unlabored, no rales or wheezes Abdomen: Positive bowel sounds, soft nontender to palpation, nondistended.  LLQ colostomy 3cm midline vertical wound Extremities: No clubbing, cyanosis, or edema Skin: No evidence of breakdown, no evidence of rash Neurologic: Cranial nerves II through XII intact, motor strength is 4/5 in bilateral deltoid, bicep, tricep, grip, 2-/5 R hip and Knee flexor,4- knee extensors,3- Left Hip flexor  ankle dorsiflexor and plantar flexor. normal finger to nose to finger on right, mild/mod dysmetria on left  Musculoskeletal: mild tenderness with palpation of right anterior thigh  Assessment/Plan: 1. Functional deficits secondary to Deconditioning and encephalopathy which require 3+ hours per day of interdisciplinary therapy in a comprehensive inpatient rehab setting. Physiatrist is providing close team supervision and 24 hour management of active medical problems listed below. Physiatrist and rehab team continue to assess barriers to discharge/monitor patient progress toward functional and medical goals. FIM: FIM - Bathing Bathing Steps Patient Completed: Chest;Right Arm;Left Arm;Abdomen;Right upper leg;Left upper leg Bathing: 3: Mod-Patient completes 5-7 418f 10 parts or 50-74%  FIM - Upper Body Dressing/Undressing Upper body dressing/undressing steps patient completed: Thread/unthread right sleeve of pullover shirt/dresss;Thread/unthread left sleeve of pullover shirt/dress Upper body dressing/undressing: 3: Mod-Patient completed 50-74% of tasks FIM - Lower Body Dressing/Undressing Lower body dressing/undressing: 1: Two helpers  FIM - Toileting Toileting steps completed by patient: Performs  perineal hygiene Toileting: 1: Two helpers  FIM - Diplomatic Services operational officerToilet Transfers Toilet Transfers Assistive Devices: Human resources officerBedside commode;Sliding board Toilet Transfers: 1-Two helpers  FIM - Architectural technologistBed/Chair Transfer Bed/Chair Transfer Assistive Devices: Sliding board Bed/Chair Transfer: 1: Two helpers  FIM - Locomotion: Wheelchair Distance: 30 Locomotion: Wheelchair: 2: Travels 50 - 149 ft with minimal assistance (Pt.>75%) FIM - Locomotion: Ambulation Locomotion: Ambulation: 0: Activity did not occur  Comprehension Comprehension Mode: Auditory Comprehension: 5-Understands basic 90% of the time/requires cueing < 10% of the time  Expression Expression Mode: Verbal Expression: 5-Expresses basic 90% of the time/requires cueing < 10% of the time.  Social Interaction Social Interaction: 5-Interacts appropriately 90% of the time - Needs monitoring or encouragement for participation or interaction.  Problem Solving Problem Solving: 3-Solves basic 50 - 74% of the time/requires cueing 25 - 49% of the time  Memory Memory: 3-Recognizes or recalls 50 - 74% of the time/requires cueing 25 - 49% of the time   Medical Problem List and Plan:  1. Functional deficits secondary to deconditioning related to respiratory failure multi-medical as above  2. DVT Prophylaxis/Anticoagulation: Subcutaneous heparin discontinued and chronic Coumadin resumed 11/22/2013 without bridging. . Doppler showed possible Left popliteal DVT in a technically limited study, no progressive symptoms. Continue low dose SQ heparin until INR therapeutic 3. Pain Management: Oxycodone immediate release  5 mg every 6 hours as needed. Added kpad for right thigh 4. Mood: Clonazepam 0.5 mg 3 times a day. positive reinforcement  5. Neuropsych: This patient is capable of making decisions on her own behalf.  6. 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 Developed respiratory stridor placed on prednisone taper.  7. Dysphagia. Mechanical soft diet. Gastrostomy  tube feeds on hold. Followup speech therapy on advancing diet  8. Diabetes mellitus. Glipizide 10 mg daily, below a.m. blood sugar reduce  Checking blood sugars a.c. and at bedtime. Sugars low in am. D/C levemir 9. Hypertension/atrial fibrillation. Lasix 40 mg daily, Norvasc 10 mg daily, Coreg 6.25 mg twice a day  10. Hypothyroidism. Tapazole 5 mg daily. Latest TSH level 2.230  11. Chronic anemia. Last hgb 8.7 monitor, check stool guaic 12. Obstructive sleep apnea. Resumed CPAP.--pt seems to be tolerating well 13.  Pt c/o increased weakness RLE some numbness feeling, On anticoag check CT abd and pelvis to eval for retroperitoneal bleed, if neg check CT head but cognitively looks very good LOS (Days) 6 A FACE TO FACE EVALUATION WAS PERFORMED  Erick Colace 11/28/2013, 8:39 AM

## 2013-11-28 NOTE — Progress Notes (Signed)
Occupational Therapy Session Note  Patient Details  Name: Sarah Tran MRN: [id number] Date of Birth: February 28, 1946  Today's Date: 11/28/2013 Time: 1100-1200 Time Calculation (min): 60 min  Short Term Goals: Week 1:  OT Short Term Goal 1 (Week 1): Pt will be able to stand at EOB with RW for 1 min with mod A to be able to pull pants over hips. OT Short Term Goal 2 (Week 1): Pt will don shirt with min A. OT Short Term Goal 3 (Week 1): Pt will demonstrate improved BUE shoulder AROM to lift her arms to be able to wash under them without assist. OT Short Term Goal 4 (Week 1): Pt will transfer to drop arm BSC with mod A x1.   Skilled Therapeutic Interventions/Progress Updates:    Pt seen this session for BADL retraining of B/D with a focus on adaptive techniques.  Pt's LB self care was performed from bed level with HOB elevated fully so pt could semi circle sit (one leg at a time) to reach legs/ feet. She then had bed lowered flat so she could work on bridging and rolling to pull her pants over her hips with mod A.  Pt was participating extremely well.  She then sat to EOB with S and used SB with S to w/c.  Pt completed UB self care at sink and was then set up to prepare for her lunch. Pt needed a great deal of positive feedback as she stated "I am feeling so depressed today because my R leg was not working well at all yesterday and I feel like I have declined".  She did state that she was feeling better at the end of the session as she had participated well. Pt will call light and phone in reach.  Therapy Documentation Precautions:  Precautions Precautions: Fall Precaution Comments: Pt with colostomy, PEG, mid abdominal wound site, healing trach site with small air leak; previous R TKA Restrictions Weight Bearing Restrictions: No   Pain: Pain Assessment Pain Assessment: No/denies pain  ADL: ADL ADL Comments: Refer to FIM  See FIM for current functional status  Therapy/Group: Individual  Therapy  Earle Gell 11/28/2013, 12:55 PM

## 2013-11-28 NOTE — Progress Notes (Signed)
ANTICOAGULATION CONSULT NOTE - Follow Up Consult  Pharmacy Consult for coumadin Indication: afib + hx of DVT  Allergies  Allergen Reactions  . Codeine Other (See Comments)    Pass out    . Peanut Butter Flavor Hives and Other (See Comments)    Lockjaw   . Penicillins Swelling    Patient Measurements: Height: 5' 2.99" (160 cm) Weight: 251 lb 1.7 oz (113.9 kg) IBW/kg (Calculated) : 52.38 Heparin Dosing Weight:   Vital Signs: Temp: 97.7 F (36.5 C) (05/18 0607) Temp src: Oral (05/18 0607) BP: 145/87 mmHg (05/18 0607) Pulse Rate: 76 (05/18 0607)  Labs:  Recent Labs  11/25/13 1211 11/26/13 0540 11/27/13 0601 11/28/13 0634  HGB 9.4*  --   --   --   HCT 29.5*  --   --   --   PLT 150  --   --   --   LABPROT  --  15.7* 17.2* 20.7*  INR  --  1.28 1.44 1.84*    Estimated Creatinine Clearance: 21.6 ml/min (by C-G formula based on Cr of 3.16).   Medications:  Scheduled:  . atorvastatin  10 mg Oral q1800  . carvedilol  6.25 mg Oral BID WC  . clonazePAM  0.5 mg Oral BID  . famotidine  20 mg Oral Daily  . furosemide  40 mg Oral Daily  . glipiZIDE  10 mg Oral QAC breakfast  . heparin subcutaneous  5,000 Units Subcutaneous Q12H  . insulin detemir  5 Units Subcutaneous QHS  . lactobacillus  1 g Oral TID WC  . methimazole  5 mg Oral Daily  . nitroGLYCERIN  1 inch Topical 4 times per day  . potassium chloride  40 mEq Oral Daily  . predniSONE  15 mg Oral Q breakfast  . senna-docusate  1 tablet Oral BID  . sodium bicarbonate  1,300 mg Oral BID  . timolol  1 drop Both Eyes BID  . vitamin C  500 mg Oral BID  . Warfarin - Pharmacist Dosing Inpatient   Does not apply q1800  . zinc sulfate  220 mg Oral Daily   Infusions:    Assessment: 68 yo female with afib and hx of DVT is currently on subtherapeutic coumadin.  INR todays is up to 1.84 from 1.44.   Goal of Therapy:  INR 2-3 Monitor platelets by anticoagulation protocol: Yes   Plan:  - Coumadin 7.5mg  PO today -  Continue heparin SQ until INR therapeutic - Daily PT / INR   Tsz-Yin Abundio Teuscher 11/28/2013,8:22 AM

## 2013-11-28 NOTE — Progress Notes (Signed)
Hypoglycemic Event  CBG: 66  Treatment: 15 GM carbohydrate snack  Symptoms: None  Follow-up CBG: Time:0755         CBG Result:78  Possible Reasons for Event: Inadequate meal intake and Unknown  Comments/MD notified:       Dani Gobble  Remember to initiate Hypoglycemia Order Set & complete

## 2013-11-28 NOTE — Progress Notes (Addendum)
Physical Therapy Session Note  Patient Details  Name: Sarah Tran MRN: [id number] Date of Birth: 1946-05-18  Today's Date: 11/28/2013 Time: 0900-0940 9024-0973 Time Calculation (min): 40 min, 30 min  Short Term Goals: Week 1:  PT Short Term Goal 1 (Week 1): Pt will roll L><R in flat hospital bed without rails, mod assist. PT Short Term Goal 2 (Week 1): Pt will transfer bed>< w/c with SB, mod assist for level surfaces, 50% of the time. PT Short Term Goal 3 (Week 1): Pt will propel w/c x 25' min assist. PT Short Term Goal 4 (Week 1): Pt will perform gait with LRAD +2 assist, x 10'.     Skilled Therapeutic Interventions/Progress Updates:  Tx 1:  Focused on therapeutic activities of bed mobility, wt shifting in sitting to assist with donning panties EOB, sit> stand to assist therapist in pulling up panties.  Rolling L with mod assist, x 5 trials, rolling R with supervision x 5 trials.  Pt required cues for technique q trial.  Supine > sit with HOB raised,  bed without rails, supervision.  Dynamic sitting balance EOB with close supervision.  Sit> stand pushing on bed with L hand, pulling up with R hand on stable surface in front of her, mod assist.  Sit> supine with mod assist for bil LEs.  Pt missed 20 min of tx awaiting abdominal CT,  due to increased weakness RLE.  Tx 2:  CT scan negative for retroperitoneal hemorrhage  Therapeutic activities in standing for pre-gait activation bil stance stability.  Sit> stand from w/c with mod assist; standing at // during wt shifting L><R including heel lift for stance stability of contralateral LE.  Gait with bari RW x 8', x 11' mod assist and mod VCs for technique and safety, a 2nd person following with w/c for safety.  Pt required standing rest breaks each walk; distance limited by RLE weakness.  Pt exhausted at end of session.  Returned to room and quick release belt applied; pt chose to stay up in w/c.  Therapist assigned pt the memory task of  remembering the gait distances today, and telling her brother [place].    Therapy Documentation Precautions:  Precautions Precautions: Fall Precaution Comments: Pt with colostomy, PEG, mid abdominal wound site, healing trach site with small air leak; previous R TKA Restrictions Weight Bearing Restrictions: No General: Amount of Missed PT Time (min): 20 Minutes Missed Time Reason: CT/MRI Pain: Pain Assessment Pain Assessment: No/denies pain Pain Score: 4  Pain Type: Acute pain Pain Location: Leg Pain Orientation: Right Pain Descriptors / Indicators: Aching;Throbbing Pain Onset: Gradual Patients Stated Pain Goal: 2 Pain Intervention(s): Medication (See eMAR)   See FIM for current functional status  Therapy/Group: Individual Therapy  Susa Loffler 11/28/2013, 9:53 AM

## 2013-11-29 ENCOUNTER — Inpatient Hospital Stay (HOSPITAL_COMMUNITY): Payer: Medicare Other | Admitting: *Deleted

## 2013-11-29 ENCOUNTER — Inpatient Hospital Stay (HOSPITAL_COMMUNITY): Payer: Medicare Other | Admitting: Occupational Therapy

## 2013-11-29 ENCOUNTER — Inpatient Hospital Stay (HOSPITAL_COMMUNITY): Payer: Medicare Other | Admitting: Speech Pathology

## 2013-11-29 DIAGNOSIS — J9620 Acute and chronic respiratory failure, unspecified whether with hypoxia or hypercapnia: Secondary | ICD-10-CM

## 2013-11-29 DIAGNOSIS — E119 Type 2 diabetes mellitus without complications: Secondary | ICD-10-CM

## 2013-11-29 DIAGNOSIS — R5381 Other malaise: Secondary | ICD-10-CM

## 2013-11-29 DIAGNOSIS — N289 Disorder of kidney and ureter, unspecified: Secondary | ICD-10-CM

## 2013-11-29 LAB — PROTIME-INR
INR: 1.9 — ABNORMAL HIGH (ref 0.00–1.49)
Prothrombin Time: 21.2 seconds — ABNORMAL HIGH (ref 11.6–15.2)

## 2013-11-29 LAB — GLUCOSE, CAPILLARY
GLUCOSE-CAPILLARY: 53 mg/dL — AB (ref 70–99)
GLUCOSE-CAPILLARY: 146 mg/dL — AB (ref 70–99)
GLUCOSE-CAPILLARY: 125 mg/dL — AB (ref 70–99)
Glucose-Capillary: 95 mg/dL (ref 70–99)
Glucose-Capillary: 52 mg/dL — ABNORMAL LOW (ref 70–99)
Glucose-Capillary: 75 mg/dL (ref 70–99)
Glucose-Capillary: 136 mg/dL — ABNORMAL HIGH (ref 70–99)

## 2013-11-29 MED ORDER — WARFARIN SODIUM 7.5 MG PO TABS
7.5000 mg | ORAL_TABLET | Freq: Once | ORAL | Status: AC
  Administered 2013-11-29: 7.5 mg via ORAL
  Filled 2013-11-29: qty 1

## 2013-11-29 MED ORDER — GLIPIZIDE 5 MG PO TABS
5.0000 mg | ORAL_TABLET | Freq: Every day | ORAL | Status: DC
  Administered 2013-11-30 – 2013-12-05 (×6): 5 mg via ORAL
  Filled 2013-11-29 (×7): qty 1

## 2013-11-29 NOTE — Progress Notes (Signed)
ANTICOAGULATION CONSULT NOTE - Follow Up Consult  Pharmacy Consult for coumadin Indication: afib and hx of DVT  Allergies  Allergen Reactions  . Codeine Other (See Comments)    Pass out    . Peanut Butter Flavor Hives and Other (See Comments)    Lockjaw   . Penicillins Swelling    Patient Measurements: Height: 5' 2.99" (160 cm) Weight: 251 lb 1.7 oz (113.9 kg) IBW/kg (Calculated) : 52.38 Heparin Dosing Weight:   Vital Signs: Temp: 98.3 F (36.8 C) (05/19 0639) Temp src: Oral (05/19 0639) BP: 150/82 mmHg (05/19 0808) Pulse Rate: 89 (05/19 0639)  Labs:  Recent Labs  11/27/13 0601 11/28/13 0634 11/28/13 0945 11/29/13 0840  HGB  --   --  8.7*  --   HCT  --   --  26.9*  --   PLT  --   --  127*  --   LABPROT 17.2* 20.7*  --  21.2*  INR 1.44 1.84*  --  1.90*    Estimated Creatinine Clearance: 21.6 ml/min (by C-G formula based on Cr of 3.16).   Medications:  Scheduled:  . atorvastatin  10 mg Oral q1800  . carvedilol  6.25 mg Oral BID WC  . clonazePAM  0.5 mg Oral BID  . famotidine  20 mg Oral Daily  . furosemide  40 mg Oral Daily  . glipiZIDE  10 mg Oral QAC breakfast  . heparin subcutaneous  5,000 Units Subcutaneous Q12H  . lactobacillus  1 g Oral TID WC  . methimazole  5 mg Oral Daily  . nitroGLYCERIN  1 inch Topical 4 times per day  . potassium chloride  40 mEq Oral Daily  . predniSONE  15 mg Oral Q breakfast  . senna-docusate  1 tablet Oral BID  . sodium bicarbonate  1,300 mg Oral BID  . timolol  1 drop Both Eyes BID  . vitamin C  500 mg Oral BID  . Warfarin - Pharmacist Dosing Inpatient   Does not apply q1800  . zinc sulfate  220 mg Oral Daily   Infusions:    Assessment: 68 yo female with afib and hx of DVT is currently on subtherapeutic coumadin.  INR is up to 1.9. Patient is also on SQ heparin Goal of Therapy:  INR 2-3 Monitor platelets by anticoagulation protocol: Yes   Plan:  - Coumadin 7.5 mg PO today - Continue heparin SQ until INR  therapeutic - Daily PT / INR - F/U resume home meds   Tsz-Yin Damaria Vachon 11/29/2013,9:27 AM

## 2013-11-29 NOTE — Progress Notes (Signed)
Speech Language Pathology Daily Session Note  Patient Details  Name: Sarah Tran MRN: [id number] Date of Birth: [birth year]  Today's Date: 11/29/2013 Time: 1117-1202 Time Calculation (min): 45 min  Short Term Goals: Week 1: SLP Short Term Goal 1 (Week 1): Pt will utilize safe swallowing strategies with recommended diet with supervision level cues SLP Short Term Goal 2 (Week 1): Pt will utilize environmental aids to demonstrate orientation x4 with Mod cues SLP Short Term Goal 3 (Week 1): Pt will demonstrate basic problem solving during familiar, functional tasks with Mod cues SLP Short Term Goal 4 (Week 1): Pt will verbalize at least at least 1 physical and 2 cognitive impairments with Mod cues SLP Short Term Goal 5 (Week 1): Pt will utilize exteral memory aids to facilitate recall of new and daily information with Mod cues SLP Short Term Goal 6 (Week 1): Pt will demonstrate sustained attention to familiar, functional task for 10 minutes with Mod cues  Skilled Therapeutic Interventions: Pt was seen for skilled speech therapy targeting dysphagia management and memory.  Pt was observed with presentations of her prescribed diet with no overt s/s of aspiration noted across any consistencies.  Additionally, pt was modified independent for recall and use of swallowing precautions during a functional meal; as a result, recommend that pt be decreased from full to intermittent supervision during meals.  Pt independently recalled 2/4 compensatory strategies for memory from yesterday's therapy session, improving to 4/4 with min-mod assist verbal cues.  During a structured memory activity, pt immediately recalled 2/4 functional details about the SLP, improving to 4/4 with mod assist; 4/4 delayed recall independently.  Pt reports good use of memory and organization strategies at baseline for both medication and financial management; therefore, SLP will plan to address medication and financial management at next  available appointment.     FIM:  Comprehension Comprehension Mode: Auditory Comprehension: 5-Understands complex 90% of the time/Cues < 10% of the time Expression Expression Mode: Verbal Expression: 5-Expresses basic needs/ideas: With extra time/assistive device Social Interaction Social Interaction: 6-Interacts appropriately with others with medication or extra time (anti-anxiety, antidepressant). Problem Solving Problem Solving: 4-Solves basic 75 - 89% of the time/requires cueing 10 - 24% of the time Memory Memory: 3-Recognizes or recalls 50 - 74% of the time/requires cueing 25 - 49% of the time FIM - Eating Eating Activity: 6: More than reasonable amount of time  Pain Pain Assessment Pain Assessment: No/denies pain  Therapy/Group: Individual Therapy  Jackalyn Lombard, M.A. CCC-SLP  Melanee Spry Talea Manges 11/29/2013, 4:27 PM

## 2013-11-29 NOTE — Progress Notes (Signed)
Occupational Therapy Session Note  Patient Details  Name: Sarah Tran MRN: [id number] Date of Birth: 22-Sep-1945  Today's Date: 11/29/2013 Time: 1015-1100 Time Calculation (min): 45 min  Short Term Goals: Week 1:  OT Short Term Goal 1 (Week 1): Pt will be able to stand at EOB with RW for 1 min with mod A to be able to pull pants over hips. OT Short Term Goal 2 (Week 1): Pt will don shirt with min A. OT Short Term Goal 3 (Week 1): Pt will demonstrate improved BUE shoulder AROM to lift her arms to be able to wash under them without assist. OT Short Term Goal 4 (Week 1): Pt will transfer to drop arm BSC with mod A x1.   Skilled Therapeutic Interventions/Progress Updates:      Pt seen for BADL retraining of bathing and dressing with a focus on sit to stand, standing tolerance,and overall activity tolerance.  Pt participated extremely well today. Pt sitting in w/c at start of session. Pt was able to come to stand with min A at sink to wash perineal area and bottom.  She was able to use reacher to get underwear and pants over feet.  Stood again ,but needed A with pulling pants up.  Pt resting in w.c at end of session with call light in reach.  Therapy Documentation Precautions:  Precautions Precautions: Fall Precaution Comments: Pt with colostomy, PEG, mid abdominal wound site, healing trach site with small air leak; previous R TKA Restrictions Weight Bearing Restrictions: No        Pain: Pain Assessment Pain Assessment: No/denies pain  ADL: ADL ADL Comments: Refer to FIM  See FIM for current functional status  Therapy/Group: Individual Therapy  Earle Gell 11/29/2013, 12:47 PM

## 2013-11-29 NOTE — Significant Event (Signed)
Hypoglycemic Event  CBG: 53  Treatment: 15 GM carbohydrate snack  Symptoms: None  Follow-up CBG: Time:0911 CBG Result:75 Possible Reasons for Event: Unknown  Comments/MD notified:Dan Angiulli notified. New orders received    Sylvie Farrier Watson Robarge  Remember to initiate Hypoglycemia Order Set & complete

## 2013-11-29 NOTE — Progress Notes (Signed)
Subjective/Complaints: 68 year old right-handed morbidly obese African American female. Past medical history of hypertension, renal insufficiency, obstructive sleep apnea with CPAP, DVT/A. fib with chronic Coumadin. Hospital course patient admitted to [facility] 09/28/2013 for removal of a ileal polyp. Noted perioperative complications balloon catheter became dislodged. She required exploratory laparotomy and diverting colostomy as well as appendectomy secondary to perforated sigmoid colon. Her hospital was complicated by respiratory failure requiring intubation and eventually tracheostomy as well his gastrostomy tube for nutritional support. She remained in intensive care for 28 days. Patient developed sepsis cultures growing gram-positive cocci in clusters maintained on broad-spectrum antibiotics. [facility] stabilized [facility] for further vent weaning and wound care. An echocardiogram is completed 10/28/2013 showing mild LVH with ejection fraction of 55-60%. 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 Hospital course complicated by acute renal insufficiency with creatinine 2.51, A. fib with RVR, question angioedema from Invanz reaction and wound dehiscence. Patient's tracheostomy tube was downsized and later be cannulated 11/18/2013.   Moving hips better Pt remembers me but not my name or what my position is here Discussed CT results  Review of Systems - otherwise negative Objective: Vital Signs: Blood pressure 149/91, pulse 89, temperature 98.3 F (36.8 C), temperature source Oral, resp. rate 18, height 5' 2.99" (1.6 m), weight 113.9 kg (251 lb 1.7 oz), SpO2 98.00%. Ct Abdomen Pelvis Wo Contrast  11/28/2013   CLINICAL DATA:  Evaluate for right retroperitoneal bleed. Right thigh weakness. Atrial fibrillation. Hypertension.  Diabetes. Obesity.  EXAM: CT ABDOMEN AND PELVIS WITHOUT CONTRAST  TECHNIQUE: Multidetector CT imaging of the abdomen and pelvis was performed following the standard protocol without IV contrast.  COMPARISON:  10/28/2013 CT.  FINDINGS: No evidence of retroperitoneal hematoma.  Post sigmoid resection with left lower abdominal/pelvic colostomy. Colonic diverticula without extra luminal bowel inflammatory process, free fluid or free air.  Anterior abdominal/pelvic wall incision healing by secondary intent. Nodularity along the inferior aspect of the incision site without drainable abscess identified.  Gastrostomy tube is in place without complication noted. Prominent hiatal hernia with slight impression upon the heart. Coronary artery calcifications suspected.  No abdominal aortic aneurysm. Atherosclerotic type changes iliac arteries with mild ectasia.  Right adrenal lesion suggestive of adenoma unchanged. Bilateral renal lesions larger ones which are cysts and others are too small to adequately characterize without change. Taking into account limitation by non contrast imaging, no worrisome hepatic, splenic, pancreatic or left adrenal lesion.  Bilateral hip joint degenerative changes greater right with subchondral cyst formation.  Degenerative changes thoracic and lumbar spine most notable lower lumbar spine.  Slight improvement in aeration of lung bases with subsegmental atelectasis remaining. Difficult to completely exclude tiny nodule left lung base although suspect this represents the presence of atelectasis. Limited CT through the lung bases can be performed in 3 - 6 months to confirm this impression.  No adenopathy.  IMPRESSION: No evidence of retroperitoneal hematoma. Please see above discussion.   Electronically Signed   By: Bridgett Larsson M.D.   On: 11/28/2013 12:20   Results for orders placed during the hospital encounter of 11/22/13 (from the past 72 hour(s))  GLUCOSE, CAPILLARY     Status: Abnormal    Collection Time    11/26/13  9:26 AM      Result Value Ref Range   Glucose-Capillary 104 (*) 70 - 99 mg/dL   Comment 1 Notify  RN    GLUCOSE, CAPILLARY     Status: Abnormal   Collection Time    11/26/13 11:28 AM      Result Value Ref Range   Glucose-Capillary 113 (*) 70 - 99 mg/dL   Comment 1 Notify RN    GLUCOSE, CAPILLARY     Status: Abnormal   Collection Time    11/26/13  4:25 PM      Result Value Ref Range   Glucose-Capillary 196 (*) 70 - 99 mg/dL   Comment 1 Notify RN    OCCULT BLOOD X 1 CARD TO LAB, STOOL     Status: None   Collection Time    11/26/13  7:48 PM      Result Value Ref Range   Fecal Occult Bld NEGATIVE  NEGATIVE  GLUCOSE, CAPILLARY     Status: Abnormal   Collection Time    11/26/13  8:33 PM      Result Value Ref Range   Glucose-Capillary 145 (*) 70 - 99 mg/dL  PROTIME-INR     Status: Abnormal   Collection Time    11/27/13  6:01 AM      Result Value Ref Range   Prothrombin Time 17.2 (*) 11.6 - 15.2 seconds   INR 1.44  0.00 - 1.49  GLUCOSE, CAPILLARY     Status: None   Collection Time    11/27/13  7:27 AM      Result Value Ref Range   Glucose-Capillary 82  70 - 99 mg/dL   Comment 1 Notify RN    GLUCOSE, CAPILLARY     Status: None   Collection Time    11/27/13 11:11 AM      Result Value Ref Range   Glucose-Capillary 92  70 - 99 mg/dL   Comment 1 Notify RN    GLUCOSE, CAPILLARY     Status: Abnormal   Collection Time    11/27/13  4:11 PM      Result Value Ref Range   Glucose-Capillary 166 (*) 70 - 99 mg/dL   Comment 1 Notify RN    GLUCOSE, CAPILLARY     Status: Abnormal   Collection Time    11/27/13  9:14 PM      Result Value Ref Range   Glucose-Capillary 69 (*) 70 - 99 mg/dL   Comment 1 Notify RN    GLUCOSE, CAPILLARY     Status: None   Collection Time    11/27/13  9:48 PM      Result Value Ref Range   Glucose-Capillary 77  70 - 99 mg/dL  PROTIME-INR     Status: Abnormal   Collection Time    11/28/13  6:34 AM      Result Value Ref Range    Prothrombin Time 20.7 (*) 11.6 - 15.2 seconds   INR 1.84 (*) 0.00 - 1.49  GLUCOSE, CAPILLARY     Status: Abnormal   Collection Time    11/28/13  7:26 AM      Result Value Ref Range   Glucose-Capillary 66 (*) 70 - 99 mg/dL   Comment 1 Notify RN    GLUCOSE, CAPILLARY     Status: None   Collection Time    11/28/13  7:53 AM      Result Value Ref Range   Glucose-Capillary 78  70 - 99 mg/dL   Comment 1 Notify RN    CBC     Status: Abnormal   Collection Time    11/28/13  9:45 AM  Result Value Ref Range   WBC 5.6  4.0 - 10.5 K/uL   RBC 2.94 (*) 3.87 - 5.11 MIL/uL   Hemoglobin 8.7 (*) 12.0 - 15.0 g/dL   HCT 16.126.9 (*) 09.636.0 - 04.546.0 %   MCV 91.5  78.0 - 100.0 fL   MCH 29.6  26.0 - 34.0 pg   MCHC 32.3  30.0 - 36.0 g/dL   RDW 40.916.6 (*) 81.111.5 - 91.415.5 %   Platelets 127 (*) 150 - 400 K/uL  OCCULT BLOOD X 1 CARD TO LAB, STOOL     Status: None   Collection Time    11/28/13  9:47 AM      Result Value Ref Range   Fecal Occult Bld NEGATIVE  NEGATIVE  GLUCOSE, CAPILLARY     Status: None   Collection Time    11/28/13 12:08 PM      Result Value Ref Range   Glucose-Capillary 90  70 - 99 mg/dL   Comment 1 Notify RN    GLUCOSE, CAPILLARY     Status: None   Collection Time    11/28/13  4:22 PM      Result Value Ref Range   Glucose-Capillary 96  70 - 99 mg/dL  GLUCOSE, CAPILLARY     Status: None   Collection Time    11/28/13  9:29 PM      Result Value Ref Range   Glucose-Capillary 95  70 - 99 mg/dL  GLUCOSE, CAPILLARY     Status: Abnormal   Collection Time    11/29/13  7:20 AM      Result Value Ref Range   Glucose-Capillary 52 (*) 70 - 99 mg/dL   Comment 1 Notify RN       HEENT: healing trach site with air leak  General: No acute distress Mood and affect are appropriate Heart: Regular rate and rhythm no rubs murmurs or extra sounds Lungs: Clear to auscultation, breathing unlabored, no rales or wheezes Abdomen: Positive bowel sounds, soft nontender to palpation, nondistended.  LLQ  colostomy 3cm midline vertical wound Extremities: No clubbing, cyanosis, or edema Skin: No evidence of breakdown, no evidence of rash Neurologic: Cranial nerves II through XII intact, motor strength is 4/5 in bilateral deltoid, bicep, tricep, grip, 3-/5 R hip and Knee flexor,4- knee extensors,3- Left Hip flexor  ankle dorsiflexor and plantar flexor. normal finger to nose to finger on right, mild/mod dysmetria on left  Musculoskeletal: mild tenderness with palpation of right anterior thigh  Assessment/Plan: 1. Functional deficits secondary to Deconditioning and encephalopathy which require 3+ hours per day of interdisciplinary therapy in a comprehensive inpatient rehab setting. Physiatrist is providing close team supervision and 24 hour management of active medical problems listed below. Physiatrist and rehab team continue to assess barriers to discharge/monitor patient progress toward functional and medical goals. FIM: FIM - Bathing Bathing Steps Patient Completed: Chest;Right Arm;Left Arm;Abdomen;Right upper leg;Left upper leg;Front perineal area Bathing: 3: Mod-Patient completes 5-7 381f 10 parts or 50-74%  FIM - Upper Body Dressing/Undressing Upper body dressing/undressing steps patient completed: Thread/unthread right sleeve of pullover shirt/dresss;Thread/unthread left sleeve of pullover shirt/dress;Put head through opening of pull over shirt/dress Upper body dressing/undressing: 4: Min-Patient completed 75 plus % of tasks FIM - Lower Body Dressing/Undressing Lower body dressing/undressing steps patient completed: Thread/unthread right underwear leg;Thread/unthread left underwear leg;Thread/unthread right pants leg;Thread/unthread left pants leg Lower body dressing/undressing: 2: Max-Patient completed 25-49% of tasks  FIM - Toileting Toileting steps completed by patient: Performs perineal hygiene Toileting: 1: Two helpers  FIM - Diplomatic Services operational officer Devices:  Human resources officer Transfers: 1-Two helpers  FIM - Banker Devices: Psychiatrist Transfer: 0: Activity did not occur  FIM - Locomotion: Wheelchair Distance: 20 Locomotion: Wheelchair: 2: Travels 50 - 149 ft with moderate assistance (Pt: 50 - 74%) FIM - Locomotion: Ambulation Locomotion: Ambulation Assistive Devices: Walker - Rolling (bari RW) Ambulation/Gait Assistance: 3: Mod assist Locomotion: Ambulation: 1: Two helpers  Comprehension Comprehension Mode: Auditory Comprehension: 5-Understands complex 90% of the time/Cues < 10% of the time  Expression Expression Mode: Verbal Expression: 5-Expresses basic 90% of the time/requires cueing < 10% of the time.  Social Interaction Social Interaction: 6-Interacts appropriately with others with medication or extra time (anti-anxiety, antidepressant).  Problem Solving Problem Solving: 4-Solves basic 75 - 89% of the time/requires cueing 10 - 24% of the time  Memory Memory: 3-Recognizes or recalls 50 - 74% of the time/requires cueing 25 - 49% of the time   Medical Problem List and Plan:  1. Functional deficits secondary to deconditioning related to respiratory failure multi-medical as above  2. DVT Prophylaxis/Anticoagulation: Subcutaneous heparin discontinued and chronic Coumadin resumed 11/22/2013 without bridging. . Doppler showed possible Left popliteal DVT in a technically limited study, no progressive symptoms. Continue low dose SQ heparin until INR therapeutic 3. Pain Management: Oxycodone immediate release 5 mg every 6 hours as needed. Added kpad for right thigh 4. Mood: Clonazepam 0.5 mg 3 times a day. positive reinforcement  5. Neuropsych: This patient is capable of making decisions on her own behalf.  6. 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 Developed respiratory stridor placed on prednisone taper.  7. Dysphagia. Mechanical soft diet. Gastrostomy tube feeds  on hold. Followup speech therapy on advancing diet  8. Diabetes mellitus. Glipizide 10 mg daily, below a.m. blood sugar reduce  Checking blood sugars a.c. and at bedtime. Sugars low in am. D/C levemir 9. Hypertension/atrial fibrillation. Lasix 40 mg daily, Norvasc 10 mg daily, Coreg 6.25 mg twice a day  10. Hypothyroidism. Tapazole 5 mg daily. Latest TSH level 2.230  11. Chronic anemia. Last hgb 8.7 monitor, check stool guaic 12. Obstructive sleep apnea. Resumed CPAP.--pt seems to be tolerating well 13.  Pt c/o increased weakness RLE yesterday, back to baseline today.  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 LOS (Days) 7 A FACE TO FACE EVALUATION WAS PERFORMED  Erick Colace 11/29/2013, 7:59 AM

## 2013-11-29 NOTE — Significant Event (Addendum)
Hypoglycemic Event  CBG: 52 Treatment: 15 GM carbohydrate snack and breakfast  Symptoms: Shaky  Follow-up CBG: [id number] CBG Result:53  Possible Reasons for Event: Unknown  Comments/MD notified:Dan Angiulli notified. New orders received    Sylvie Farrier Brazen Domangue  Remember to initiate Hypoglycemia Order Set & complete

## 2013-11-29 NOTE — Consult Note (Signed)
WOC met with patient, she seems more clear minded today and ask many appropriate questions about her surgery and ostomy. We will plan to change her pouch in the am with her watching my demonstration.  She is in agreement with this plan. She does report support at home from her daughter who she lives with and she has two granddaughters also in health care that have agreed to assist her with her care.  Sanvika Cuttino Saguache RN,CWOCN [phone]

## 2013-11-29 NOTE — Progress Notes (Signed)
Physical Therapy Weekly Progress Note  Patient Details  Name: Sarah Tran MRN: [id number] Date of Birth: 05/29/1946  Beginning of progress report period: Nov 22, 2013 End of progress report period: Nov 29, 2013  Today's Date: 11/29/2013 Time: 9:02-10:00 (62mn) and  [address]Time Calculation (min): 45 min  Patient has met 4 of 4 short term goals.  Pt is making excellent progress in independence with functional mobility due to increased activity tolerance, strength, and cognition. Pt has progressed from sliding board transfers to using a RW and is now walking up to 20.'  Patient continues to demonstrate the following deficits: LE weakness, knee pain, decreased cardiorespiratory endurance and therefore will continue to benefit from skilled PT intervention to enhance overall performance with activity tolerance, balance and ability to compensate for deficits.  Patient progressing toward long term goals..  Continue plan of care.  PT Short Term Goals Week 1:  PT Short Term Goal 1 (Week 1): Pt will roll L><R in flat hospital bed without rails, mod assist. PT Short Term Goal 1 - Progress (Week 1): Met PT Short Term Goal 2 (Week 1): Pt will transfer bed>< w/c with SB, mod assist for level surfaces, 50% of the time. PT Short Term Goal 2 - Progress (Week 1): Met PT Short Term Goal 3 (Week 1): Pt will propel w/c x 25' min assist. PT Short Term Goal 3 - Progress (Week 1): Met PT Short Term Goal 4 (Week 1): Pt will perform gait with LRAD +2 assist, x 10'. PT Short Term Goal 4 - Progress (Week 1): Met Week 2:  PT Short Term Goal 1 (Week 2): Pt will perform sit>supine with Min A PT Short Term Goal 2 (Week 2): Pt will perform bed<>chair transfers with min A  PT Short Term Goal 3 (Week 2): Pt will ambulate 552 with min A PT Short Term Goal 4 (Week 2): Pt will perform 3 stairs with Mod A  PT Short Term Goal 5 (Week 2): Pt will stand x562m with Supervision  Skilled Therapeutic Interventions/Progress  Updates:  First tx focused on therex, bed mobility, and sliding board transfers.   Instructed pt in supine and sidelying therex for increased functional independence:  ankle pumps, heel slides with AAROM for alignment, bridging with LE stabilized, and clamshells, 2x10 each. Pt needed 48m26mrest between each set to catch breath.   Rolling with min A for LE positioning and bed rails. Supine>sit with S and bedrails.  Bed>WC with sliding bard and min A for steadying only, assist with board placement and removal.   Gait with RW x18' with Mod A lifting and min A steadying, decreased speed and posture.  Standing at high table x48mi27msec with min steadying assist with clothes pin task. Pt with decreased strength and accuracy of movements with UEs during this task, needing to sit due to fatigue.  --------- Second tx focused on serial sit<>stands, transfers with RW, kinetron for strengthening, and gait with RW.  Pt challenged to recall 3 items from beginning to end of tx, which she was able to do with increased time.   Performed 8min4m kinetron seated in WC at 20cm/sec with no rest breaks.   Gait in gym x20' with pt verbalizing each step of sequence. Mod lifting assist to RW, then min steadying only.  Sit<>stands at 22" mat x5 with heavy UE reliance and trunk rotation to accomplish task, min lifting assist from elevated surface. Pt given cues to engage glutes for full hip ext in standing.  Instructed pt in chiar<>mat transfers with RW, again mod lifting and min steadying during turn.  Pt needing mod lifting LEs during sit>supine transfer.  Pt pleased with progress, left with all needs in reach.           Therapy Documentation Precautions:  Precautions Precautions: Fall Precaution Comments: Pt with colostomy, PEG, mid abdominal wound site, healing trach site with small air leak; previous R TKA Restrictions Weight Bearing Restrictions: No General:   Vital Signs: Therapy Vitals Temp:  98.5 F (36.9 C) Temp src: Oral Pulse Rate: 83 Resp: 18 BP: 164/88 mmHg Patient Position (if appropriate): Sitting Oxygen Therapy SpO2: 99 % O2 Device: None (Room air) Pain: no complaints today    Locomotion : Ambulation Ambulation/Gait Assistance: 4: Min assist   See FIM for current functional status  Therapy/Group: Individual Therapy Kennieth Rad, PT, DPT  11/29/2013, 4:04 PM

## 2013-11-30 ENCOUNTER — Encounter (HOSPITAL_COMMUNITY): Payer: Medicare Other | Admitting: Occupational Therapy

## 2013-11-30 ENCOUNTER — Inpatient Hospital Stay (HOSPITAL_COMMUNITY): Payer: Medicare Other

## 2013-11-30 ENCOUNTER — Inpatient Hospital Stay (HOSPITAL_COMMUNITY): Payer: Medicare Other | Admitting: Speech Pathology

## 2013-11-30 LAB — GLUCOSE, CAPILLARY
GLUCOSE-CAPILLARY: 68 mg/dL — AB (ref 70–99)
GLUCOSE-CAPILLARY: 75 mg/dL (ref 70–99)
Glucose-Capillary: 57 mg/dL — ABNORMAL LOW (ref 70–99)
Glucose-Capillary: 83 mg/dL (ref 70–99)
Glucose-Capillary: 204 mg/dL — ABNORMAL HIGH (ref 70–99)
Glucose-Capillary: 135 mg/dL — ABNORMAL HIGH (ref 70–99)

## 2013-11-30 LAB — PROTIME-INR
INR: 2.24 — ABNORMAL HIGH (ref 0.00–1.49)
Prothrombin Time: 24.1 seconds — ABNORMAL HIGH (ref 11.6–15.2)

## 2013-11-30 MED ORDER — NITROGLYCERIN 0.2 MG/HR TD PT24
0.2000 mg | MEDICATED_PATCH | Freq: Every day | TRANSDERMAL | Status: DC
  Administered 2013-11-30 – 2013-12-12 (×13): 0.2 mg via TRANSDERMAL
  Filled 2013-11-30 (×14): qty 1

## 2013-11-30 MED ORDER — CARVEDILOL 12.5 MG PO TABS
12.5000 mg | ORAL_TABLET | Freq: Two times a day (BID) | ORAL | Status: DC
  Administered 2013-11-30 – 2013-12-07 (×15): 12.5 mg via ORAL
  Filled 2013-11-30 (×18): qty 1

## 2013-11-30 MED ORDER — WARFARIN SODIUM 7.5 MG PO TABS
7.5000 mg | ORAL_TABLET | Freq: Once | ORAL | Status: AC
  Administered 2013-11-30: 7.5 mg via ORAL
  Filled 2013-11-30: qty 1

## 2013-11-30 NOTE — Progress Notes (Addendum)
Subjective/Complaints: 68 year old right-handed morbidly obese African American female. Past medical history of hypertension, renal insufficiency, obstructive sleep apnea with CPAP, DVT/A. fib with chronic Coumadin. Hospital course patient admitted to [facility] 09/28/2013 for removal of a ileal polyp. Noted perioperative complications balloon catheter became dislodged. She required exploratory laparotomy and diverting colostomy as well as appendectomy secondary to perforated sigmoid colon. Her hospital was complicated by respiratory failure requiring intubation and eventually tracheostomy as well his gastrostomy tube for nutritional support. She remained in intensive care for 28 days. Patient developed sepsis cultures growing gram-positive cocci in clusters maintained on broad-spectrum antibiotics. [facility] stabilized [facility] for further vent weaning and wound care. An echocardiogram is completed 10/28/2013 showing mild LVH with ejection fraction of 55-60%. 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 Hospital course complicated by acute renal insufficiency with creatinine 2.51, A. fib with RVR, question angioedema from Invanz reaction and wound dehiscence. Patient's tracheostomy tube was downsized and later be cannulated 11/18/2013.   Patient complains of cough no shortness of breath sputum mainly clear occasional yellow. Review of Systems - otherwise negative Objective: Vital Signs: Blood pressure 148/82, pulse 85, temperature 97.2 F (36.2 C), temperature source Oral, resp. rate 18, height 5' 2.99" (1.6 m), weight 105.235 kg (232 lb), SpO2 99.00%. Ct Abdomen Pelvis Wo Contrast  11/28/2013   CLINICAL DATA:  Evaluate for right retroperitoneal bleed. Right thigh weakness. Atrial fibrillation. Hypertension. Diabetes. Obesity.   EXAM: CT ABDOMEN AND PELVIS WITHOUT CONTRAST  TECHNIQUE: Multidetector CT imaging of the abdomen and pelvis was performed following the standard protocol without IV contrast.  COMPARISON:  10/28/2013 CT.  FINDINGS: No evidence of retroperitoneal hematoma.  Post sigmoid resection with left lower abdominal/pelvic colostomy. Colonic diverticula without extra luminal bowel inflammatory process, free fluid or free air.  Anterior abdominal/pelvic wall incision healing by secondary intent. Nodularity along the inferior aspect of the incision site without drainable abscess identified.  Gastrostomy tube is in place without complication noted. Prominent hiatal hernia with slight impression upon the heart. Coronary artery calcifications suspected.  No abdominal aortic aneurysm. Atherosclerotic type changes iliac arteries with mild ectasia.  Right adrenal lesion suggestive of adenoma unchanged. Bilateral renal lesions larger ones which are cysts and others are too small to adequately characterize without change. Taking into account limitation by non contrast imaging, no worrisome hepatic, splenic, pancreatic or left adrenal lesion.  Bilateral hip joint degenerative changes greater right with subchondral cyst formation.  Degenerative changes thoracic and lumbar spine most notable lower lumbar spine.  Slight improvement in aeration of lung bases with subsegmental atelectasis remaining. Difficult to completely exclude tiny nodule left lung base although suspect this represents the presence of atelectasis. Limited CT through the lung bases can be performed in 3 - 6 months to confirm this impression.  No adenopathy.  IMPRESSION: No evidence of retroperitoneal hematoma. Please see above discussion.   Electronically Signed   By: Chauncey Cruel M.D.   On: 11/28/2013 12:20   Results for orders placed during the hospital encounter of 11/22/13 (from the past 72 hour(s))  GLUCOSE, CAPILLARY     Status: None   Collection Time    11/27/13  11:11 AM      Result Value Ref Range   Glucose-Capillary 92  70 - 99 mg/dL   Comment 1 Notify RN    GLUCOSE, CAPILLARY  Status: Abnormal   Collection Time    11/27/13  4:11 PM      Result Value Ref Range   Glucose-Capillary 166 (*) 70 - 99 mg/dL   Comment 1 Notify RN    GLUCOSE, CAPILLARY     Status: Abnormal   Collection Time    11/27/13  9:14 PM      Result Value Ref Range   Glucose-Capillary 69 (*) 70 - 99 mg/dL   Comment 1 Notify RN    GLUCOSE, CAPILLARY     Status: None   Collection Time    11/27/13  9:48 PM      Result Value Ref Range   Glucose-Capillary 77  70 - 99 mg/dL  PROTIME-INR     Status: Abnormal   Collection Time    11/28/13  6:34 AM      Result Value Ref Range   Prothrombin Time 20.7 (*) 11.6 - 15.2 seconds   INR 1.84 (*) 0.00 - 1.49  GLUCOSE, CAPILLARY     Status: Abnormal   Collection Time    11/28/13  7:26 AM      Result Value Ref Range   Glucose-Capillary 66 (*) 70 - 99 mg/dL   Comment 1 Notify RN    GLUCOSE, CAPILLARY     Status: None   Collection Time    11/28/13  7:53 AM      Result Value Ref Range   Glucose-Capillary 78  70 - 99 mg/dL   Comment 1 Notify RN    CBC     Status: Abnormal   Collection Time    11/28/13  9:45 AM      Result Value Ref Range   WBC 5.6  4.0 - 10.5 K/uL   RBC 2.94 (*) 3.87 - 5.11 MIL/uL   Hemoglobin 8.7 (*) 12.0 - 15.0 g/dL   HCT 26.9 (*) 36.0 - 46.0 %   MCV 91.5  78.0 - 100.0 fL   MCH 29.6  26.0 - 34.0 pg   MCHC 32.3  30.0 - 36.0 g/dL   RDW 16.6 (*) 11.5 - 15.5 %   Platelets 127 (*) 150 - 400 K/uL  OCCULT BLOOD X 1 CARD TO LAB, STOOL     Status: None   Collection Time    11/28/13  9:47 AM      Result Value Ref Range   Fecal Occult Bld NEGATIVE  NEGATIVE  GLUCOSE, CAPILLARY     Status: None   Collection Time    11/28/13 12:08 PM      Result Value Ref Range   Glucose-Capillary 90  70 - 99 mg/dL   Comment 1 Notify RN    GLUCOSE, CAPILLARY     Status: None   Collection Time    11/28/13  4:22 PM       Result Value Ref Range   Glucose-Capillary 96  70 - 99 mg/dL  GLUCOSE, CAPILLARY     Status: None   Collection Time    11/28/13  9:29 PM      Result Value Ref Range   Glucose-Capillary 95  70 - 99 mg/dL  GLUCOSE, CAPILLARY     Status: Abnormal   Collection Time    11/29/13  7:20 AM      Result Value Ref Range   Glucose-Capillary 52 (*) 70 - 99 mg/dL   Comment 1 Notify RN    PROTIME-INR     Status: Abnormal   Collection Time    11/29/13  8:40 AM  Result Value Ref Range   Prothrombin Time 21.2 (*) 11.6 - 15.2 seconds   INR 1.90 (*) 0.00 - 1.49  GLUCOSE, CAPILLARY     Status: Abnormal   Collection Time    11/29/13  8:43 AM      Result Value Ref Range   Glucose-Capillary 53 (*) 70 - 99 mg/dL   Comment 1 Notify RN    GLUCOSE, CAPILLARY     Status: None   Collection Time    11/29/13  9:11 AM      Result Value Ref Range   Glucose-Capillary 75  70 - 99 mg/dL   Comment 1 Notify RN    GLUCOSE, CAPILLARY     Status: Abnormal   Collection Time    11/29/13 11:40 AM      Result Value Ref Range   Glucose-Capillary 146 (*) 70 - 99 mg/dL   Comment 1 Notify RN    GLUCOSE, CAPILLARY     Status: Abnormal   Collection Time    11/29/13  4:44 PM      Result Value Ref Range   Glucose-Capillary 125 (*) 70 - 99 mg/dL  GLUCOSE, CAPILLARY     Status: Abnormal   Collection Time    11/29/13  8:59 PM      Result Value Ref Range   Glucose-Capillary 136 (*) 70 - 99 mg/dL  PROTIME-INR     Status: Abnormal   Collection Time    11/30/13  6:03 AM      Result Value Ref Range   Prothrombin Time 24.1 (*) 11.6 - 15.2 seconds   INR 2.24 (*) 0.00 - 1.49  GLUCOSE, CAPILLARY     Status: Abnormal   Collection Time    11/30/13  7:23 AM      Result Value Ref Range   Glucose-Capillary 68 (*) 70 - 99 mg/dL   Comment 1 Notify RN    GLUCOSE, CAPILLARY     Status: None   Collection Time    11/30/13  7:45 AM      Result Value Ref Range   Glucose-Capillary 75  70 - 99 mg/dL   Comment 1 Notify RN        HEENT: healing trach site with air leak  General: No acute distress Mood and affect are appropriate Heart: Regular rate and rhythm no rubs murmurs or extra sounds Lungs: Clear to auscultation, breathing unlabored, no rales or wheezes Abdomen: Positive bowel sounds, soft nontender to palpation, nondistended. Extremities: No clubbing, cyanosis, or edema, G tube site healed Skin: No evidence of breakdown, no evidence of rash Neurologic: Cranial nerves II through XII intact, motor strength is 3/5 in bilateral deltoid, 4/5 bicep, tricep, grip, 3-/5 R hip and Knee flexor,4- knee extensors,3- Left Hip flexor  ankle dorsiflexor and plantar flexor. normal finger to nose to finger on right, mild/mod dysmetria on left  Musculoskeletal: Tenderness mild right Achilles  Assessment/Plan: 1. 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 Physiatrist is providing close team supervision and 24 hour management of active medical problems listed below. Physiatrist and rehab team continue to assess barriers to discharge/monitor patient progress toward functional and medical goals. Team conference today please see physician documentation under team conference tab, met with team face-to-face to discuss problems,progress, and goals. Formulized individual treatment plan based on medical history, underlying problem and comorbidities. FIM: FIM - Bathing Bathing Steps Patient Completed: Chest;Right Arm;Left Arm;Abdomen;Right upper  leg;Left upper leg;Front perineal area;Buttocks Bathing: 4: Min-Patient completes 8-9 21f10 parts or 75+ percent  FIM - Upper Body Dressing/Undressing Upper body dressing/undressing steps patient completed: Thread/unthread right sleeve of pullover shirt/dresss;Thread/unthread left sleeve of pullover shirt/dress;Put head through opening of pull over  shirt/dress Upper body dressing/undressing: 4: Min-Patient completed 75 plus % of tasks FIM - Lower Body Dressing/Undressing Lower body dressing/undressing steps patient completed: Thread/unthread right underwear leg;Thread/unthread left underwear leg;Thread/unthread right pants leg;Thread/unthread left pants leg Lower body dressing/undressing: 2: Max-Patient completed 25-49% of tasks  FIM - Toileting Toileting steps completed by patient: Performs perineal hygiene Toileting: 1: Two helpers  FIM - TRadio producerDevices: WEnvironmental consultantBedside commode Toilet Transfers: 1-Two helpers  FIM - BControl and instrumentation engineerDevices: WEnvironmental consultantArm rests Bed/Chair Transfer: 3: Sit > Supine: Mod A (lifting assist/Pt. 50-74%/lift 2 legs);3: Chair or W/C > Bed: Mod A (lift or lower assist);3: Bed > Chair or W/C: Mod A (lift or lower assist)  FIM - Locomotion: Wheelchair Distance: 20 Locomotion: Wheelchair: 1: Total Assistance/staff pushes wheelchair (Pt<25%) FIM - Locomotion: Ambulation Locomotion: Ambulation Assistive Devices: WAdministratorAmbulation/Gait Assistance: 4: Min assist Locomotion: Ambulation: 1: Travels less than 50 ft with minimal assistance (Pt.>75%)  Comprehension Comprehension Mode: Auditory Comprehension: 5-Understands complex 90% of the time/Cues < 10% of the time  Expression Expression Mode: Verbal Expression: 5-Expresses basic 90% of the time/requires cueing < 10% of the time.  Social Interaction Social Interaction: 6-Interacts appropriately with others with medication or extra time (anti-anxiety, antidepressant).  Problem Solving Problem Solving: 4-Solves basic 75 - 89% of the time/requires cueing 10 - 24% of the time  Memory Memory: 3-Recognizes or recalls 50 - 74% of the time/requires cueing 25 - 49% of the time   Medical Problem List and Plan:  1. Functional deficits secondary to deconditioning related to respiratory  failure multi-medical as above  2. DVT Prophylaxis/Anticoagulation: Subcutaneous heparin discontinued and chronic Coumadin resumed 11/22/2013 without bridging. . Doppler showed possible Left popliteal DVT in a technically limited study, no progressive symptoms. Continue low dose SQ heparin until INR therapeutic 3. Pain Management: Oxycodone immediate release 5 mg every 6 hours as needed. Added kpad for right thigh 4. Mood: Clonazepam 0.5 mg 3 times a day. positive reinforcement  5. Neuropsych: This patient is capable of making decisions on her own behalf.  6. 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 occ cough will monitor start mucolytic 7. Dysphagia. Mechanical soft diet. Gastrostomy tube feeds on hold. Followup speech therapy on advancing diet  8. Diabetes mellitus. Glipizide reduced to 581mon 5/19 ,titrate  Checking blood sugars a.c. and at bedtime. Sugars low in am. D/C levemir 5/18 9. Hypertension/atrial fibrillation.Uncontrolled Lasix 40 mg daily, Norvasc 10 mg daily,  Given HR in 80s increase Coreg 12.5 mg twice a day  10. Hypothyroidism. Tapazole 5 mg daily. Latest TSH level 2.230  11. Chronic anemia. Last hgb 8.7 monitor, check stool guaic 12. Obstructive sleep apnea. Resumed CPAP.--pt seems to be tolerating well 13.  Pt c/o increased weakness RLE yesterday, back to baseline today.  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 LOS (Days) 8 A FACE TO FACE EVALUATION WAS PERFORMED  AnCharlett Blake/20/2015, 9:57 AM

## 2013-11-30 NOTE — Progress Notes (Addendum)
ANTICOAGULATION CONSULT NOTE - Follow Up Consult  Pharmacy Consult for coumadin Indication: afib and hx of DVT  Allergies  Allergen Reactions  . Codeine Other (See Comments)    Pass out    . Peanut Butter Flavor Hives and Other (See Comments)    Lockjaw   . Penicillins Swelling    Patient Measurements: Height: 5' 2.99" (160 cm) Weight: 232 lb (105.235 kg) IBW/kg (Calculated) : 52.38   Vital Signs: Temp: 97.2 F (36.2 C) (05/20 0505) Temp src: Oral (05/20 0505) BP: 148/82 mmHg (05/20 0507) Pulse Rate: 85 (05/20 0505)  Labs:  Recent Labs  11/28/13 0634 11/28/13 0945 11/29/13 0840 11/30/13 0603  HGB  --  8.7*  --   --   HCT  --  26.9*  --   --   PLT  --  127*  --   --   LABPROT 20.7*  --  21.2* 24.1*  INR 1.84*  --  1.90* 2.24*    Estimated Creatinine Clearance: 20.6 ml/min (by C-G formula based on Cr of 3.16).   Medications:  Scheduled:  . atorvastatin  10 mg Oral q1800  . carvedilol  6.25 mg Oral BID WC  . clonazePAM  0.5 mg Oral BID  . famotidine  20 mg Oral Daily  . furosemide  40 mg Oral Daily  . glipiZIDE  5 mg Oral QAC breakfast  . heparin subcutaneous  5,000 Units Subcutaneous Q12H  . lactobacillus  1 g Oral TID WC  . methimazole  5 mg Oral Daily  . nitroGLYCERIN  1 inch Topical 4 times per day  . potassium chloride  40 mEq Oral Daily  . predniSONE  15 mg Oral Q breakfast  . senna-docusate  1 tablet Oral BID  . sodium bicarbonate  1,300 mg Oral BID  . timolol  1 drop Both Eyes BID  . vitamin C  500 mg Oral BID  . Warfarin - Pharmacist Dosing Inpatient   Does not apply q1800  . zinc sulfate  220 mg Oral Daily   Infusions:    Assessment: 68 yo morbidly obese female with afib and hx of DVT continues on chronic coumadin.  INR is therapeutic today, INR 2.24 up from 1.9 yesterday. Patient is also on SQ heparin DVT prophylaxis. No bleeding noted.  Patient with chronic anemia hemoglobin varying from 8.4-8.9. Patient's chronic Coumadin for history  of DVT/atrial fibrillation was resumed 11/22/2013.  Patient is s/p a complicated hospital course at [facility] starting 09/28/13 then [facility]. Admitted to rehab unit on 11/22/13.    Goal of Therapy:  INR 2-3 Monitor platelets by anticoagulation protocol: Yes   Plan:  Coumadin 7.5 mg PO today. Consider discontinuing SQ heparin since INR therapeutic today.  Daily PT / INR  F/U resume home meds  Noah Delaine, RPh Clinical Pharmacist Pager: [phone] 11/30/2013,9:39 AM  Addendum:  Dr. Wynn Banker' note indicates to discontinue SQ heparin once INR is therapeutic, thus I have discontinued the SQ heparin today.   Noah Delaine, RPh Clinical Pharmacist Pager: [phone] 11/30/2013 10:37 AM

## 2013-11-30 NOTE — Consult Note (Signed)
WOC ostomy follow up Stoma type/location: end colostomy, LLQ  Stomal assessment/size: 1 " round, flush with the skin  Peristomal assessment: intact Treatment options for stomal/peristomal skin: added 2" barrier for flush stoma Output pasty yellow brown stool and flatus Ostomy pouching: 2pc. 2 1/4" used with 2" barrier ring Education provided: demonstrated pouch change with patient. She did participate with learning lock and roll closure and attempted to attach pouch to wafer prior to placement of pouch on her abdomen.  She is interested in a precut wafer if possible, and since she is 2 months out from her ostomy surgery this is a realistic plan and I do not anticipate her stoma changing much unless she gains or losses weight.  Will continue to work with her to gain her independence with her ostomy. Demonstrated release of flatus from the pouch by burping pouch.  Enrolled patient in [place] Secure Start Discharge program: Yes, she has requested her samples to be sent to her home address in [place].  I have verified this address and that there is someone to pick up a package there once this arrives.   Note: Changed midline surgical wound  WOC wound follow up Wound type: surgical  Measurement:1.5cm x 1.0cm x 1.0cm  Wound AVW:UJWJX a bit pale Drainage (amount, consistency, odor) moderate, serous, no odor Periwound:intact Dressing procedure/placement/frequency: Silver hydrofiber cut to fit and placed in the wound bed, topped with dry dressing, secured with tape. To be changed M/W/F.   WOC will follow along with you for continued support for ostomy care and wound assessment at least weekly.  Virjean Boman [place] RN,CWOCN [phone]

## 2013-11-30 NOTE — Progress Notes (Signed)
Speech Language Pathology Weekly Progress and Session Note  Patient Details  Name: Sarah Tran MRN: [id number] Date of Birth: 1946/01/12  Beginning of progress report period: Nov 23, 2013 End of progress report period: Nov 30, 2013  Today's Date: 11/30/2013 Time: 1100-1130 Time Calculation (min): 30 min  Short Term Goals: Week 1: SLP Short Term Goal 1 (Week 1): Pt will utilize safe swallowing strategies with recommended diet with supervision level cues SLP Short Term Goal 1 - Progress (Week 1): Met SLP Short Term Goal 2 (Week 1): Pt will utilize environmental aids to demonstrate orientation x4 with Mod cues SLP Short Term Goal 2 - Progress (Week 1): Met SLP Short Term Goal 3 (Week 1): Pt will demonstrate basic problem solving during familiar, functional tasks with Mod cues SLP Short Term Goal 3 - Progress (Week 1): Met SLP Short Term Goal 4 (Week 1): Pt will verbalize at least at least 1 physical and 2 cognitive impairments with Mod cues SLP Short Term Goal 4 - Progress (Week 1): Met SLP Short Term Goal 5 (Week 1): Pt will utilize exteral memory aids to facilitate recall of new and daily information with Mod cues SLP Short Term Goal 5 - Progress (Week 1): Met SLP Short Term Goal 6 (Week 1): Pt will demonstrate sustained attention to familiar, functional task for 10 minutes with Mod cues SLP Short Term Goal 6 - Progress (Week 1): Met    New Short Term Goals: Week 2: SLP Short Term Goal 1 (Week 2): Pt will utilize safe swallowing strategies with recommended diet with modified independence SLP Short Term Goal 2 (Week 2): Pt will demonstrate basic problem solving during familiar, functional tasks with min cues SLP Short Term Goal 3 (Week 2): Pt will verbalize at least at least 1 physical and 2 cognitive impairments with min  cues SLP Short Term Goal 4 (Week 2): Pt will utilize exteral memory aids to facilitate recall of new and daily information with Min cues SLP Short Term Goal 5 (Week  2): Pt will improve alternating/divided attention to functional task for 10 minutes with Min cues  Weekly Progress Updates: Pt is a 68 year old right-handed morbidly obese African American female. PMH of hypertension, renal insufficiency, obstructive sleep apnea with CPAP, DVT/A. fib with chronic Coumadin. Pt was admitted to [facility] 09/28/2013 for removal of an ileal polyp. Her hospital was complicated by respiratory failure requiring intubation and eventually tracheostomy as well g-tube. She remained in intensive care for 28 days. Pt developed sepsis. Pt stabilized and was transferred to [facility] 10/25/2013 for further vent weaning and wound care. 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 Pt's tracheostomy tube was downsized and later decannulated 11/18/2013. Pt developed some respiratory stridor and was placed on a prednisone taper. Pt with profound deconditioning and was admitted for comprehensive rehabilitation program.   Patient has made marked gains this reporting period and has met 6 of 6 short term goals this reporting period due to improved orientation, sustained attention, recall of daily events and information and safety awareness for swallowing precautions.  Currently, patient continues to require mod assist for recall of complex information, alternating/divided attention, and semi-complex problem solving.  Patient and family education are ongoing. Additionally, pt's diet was upgraded from dys 3 solids and thin liquids to regular solids with thin liquids and pt's supervision level was decreased from full to intermittent supervision during meals.  Patient would benefit from continued skilled SLP intervention to target memory, executive function, and semi-complex attention in order to maximize his/her functional independence prior to discharge.     Intensity: Minumum of 1-2 x/day, 30 to 90 minutes Frequency: 5 out of 7 days Duration/Length of Stay: 20-24 days Treatment/Interventions: Cognitive remediation/compensation;Cueing hierarchy;Dysphagia/aspiration precaution training;Environmental controls;Functional tasks;Internal/external aids;Patient/family education;Speech/Language facilitation   Daily Session  Skilled Therapeutic Interventions: Pt was seen for skilled speech therapy targeting memory and executive function skills for medication management.  Pt recalled 2/4 compensatory strategies for memory from previous sessions independently, improving to 4/4 with mod assist.  During a structured medication management task, pt was resistant to organize medications into a pill box as she did not use a pill box at baseline.   SLP provided encouragement to attempt using a pill box to facilitate improved organization and error awareness/insight during medication management at discharge.  Pt organized 1 BID medication for 100% accuracy with supervision cues for organization and then requested to stop task.  Pt reported that she was hesitant to use a pill box at discharge because she was afraid she would lose it.  Per pt, pt kept her pills in a box on her dresser with a list of scheduled medications.  Encouraged pt to use whichever medication management system with which she was most comfortable and have a family member double check for errors.  Pt was in agreement with the aforementioned recommendations.         FIM:  Comprehension Comprehension Mode: Auditory Comprehension: 5-Follows basic conversation/direction: With extra time/assistive device Expression Expression Mode: Verbal Expression: 5-Expresses basic 90% of the time/requires cueing < 10% of the time. Social Interaction Social Interaction: 6-Interacts appropriately with others with medication or extra time (anti-anxiety, antidepressant). Problem Solving Problem Solving: 4-Solves  basic 75 - 89% of the time/requires cueing 10 - 24% of the time Memory Memory: 3-Recognizes or recalls 50 - 74% of the time/requires cueing 25 - 49% of the time  Pain Pain Assessment Pain Assessment: 0-10  Therapy/Group: Individual Therapy  Windell Moulding, M.A. CCC-SLP  Sarah Tran 11/30/2013, 4:43 PM

## 2013-11-30 NOTE — Progress Notes (Signed)
Occupational Therapy Session Note  Patient Details  Name: Sarah Tran MRN: [id number] Date of Birth: 1945-08-31  Today's Date: 11/30/2013 Time: 1015-1058 Time Calculation (min): 43 min  Short Term Goals: Week 1:  OT Short Term Goal 1 (Week 1): Pt will be able to stand at EOB with RW for 1 min with mod A to be able to pull pants over hips. OT Short Term Goal 2 (Week 1): Pt will don shirt with min A. OT Short Term Goal 3 (Week 1): Pt will demonstrate improved BUE shoulder AROM to lift her arms to be able to wash under them without assist. OT Short Term Goal 4 (Week 1): Pt will transfer to drop arm BSC with mod A x1.   Skilled Therapeutic Interventions/Progress Updates:    Pt seen today for individual OT treatment related to ADL retraining w/ focus on activity tolerance, endurance, a/e use, focus on sit to stand, standing tolerance. Pt was sitting up in w/c at start of session & agreeable to bathing/dressing at sink level. Pt was able to come to stand with min A at sink to wash perineal area and bottom. She used a/e [facility] [facility]) to get underwear and pants over feet. Pt needed assistance to pull her underwear and pants up & also required vc's for energy conservation techniques and pursed lip breathing during rest breaks PRN. Pt requested assistance (mod A) in getting back to bed at conclusion of session. Call bell was within reach and bed alarm on, pt resting.  Therapy Documentation Precautions:  Precautions Precautions: Fall Precaution Comments: Pt with colostomy, PEG, mid abdominal wound site, healing trach site with small air leak; previous R TKA Restrictions Weight Bearing Restrictions: No General:     Pain: Pain Assessment Pain Assessment: No/denies pain Pain Score: 4  Pain Location: Knee Pain Orientation: Right Pain Onset: With Activity Pain Intervention(s): Medication (See eMAR) ADL: ADL ADL Comments: Refer to FIM     See FIM for current functional status  Therapy/Group:  Individual Therapy  Amy B Barnhill 11/30/2013, 12:17 PM

## 2013-11-30 NOTE — Progress Notes (Signed)
Social Work Patient ID: Sarah Tran, female   DOB: Dec 13, 1945, 68 y.o.   MRN: [id number] Met with pt and brother to inform of team conference progression toward goals of min level and discharge 6/3. Both pleased with the date before her [birth year].  Brother is building a ramp and will get started on this.  Aware she will need  24 hour care and family is prepared to provide this.  Will work on discharge needs and family education once appropriate.

## 2013-11-30 NOTE — Progress Notes (Signed)
Social Work Lucy Chrisebecca G Lawanda Holzheimer, LCSW Social Worker Signed  Patient Care Conference Service date: 11/30/2013 4:12 PM  Inpatient RehabilitationTeam Conference and Plan of Care Update Date: 11/30/2013   Time: 10;45 Am     Patient Name: Sarah Tran       Medical Record Number: [id number]   Date of Birth: 01-Mar-1946 Sex: Female         Room/Bed: 4W02C/4W02C-01 Payor Info: Payor: MEDICARE / Plan: MEDICARE PART A AND B / Product Type: *No Product type* /   Admitting Diagnosis: Deconditioned   Admit Date/Time:  11/22/2013  4:33 PM Admission Comments: No comment available   Primary Diagnosis:  <principal problem not specified> Principal Problem: <principal problem not specified>    Patient Active Problem List     Diagnosis  Date Noted   .  Physical deconditioning  11/22/2013   .  Acute on chronic respiratory failure  10/26/2013   .  Tracheostomy status  10/26/2013   .  Toxic metabolic encephalopathy  10/26/2013   .  Hypervolemia  10/26/2013   .  Acute on chronic renal insufficiency  10/26/2013   .  DM2 (diabetes mellitus, type 2)  10/26/2013   .  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 Claudette LawsAndrew Kirsteins Social Worker Present: Dossie DerBecky Karisa Nesser, LCSW Nurse Present: Ronny BaconWhitney Reardon, RN PT Present: Edman CircleAudra Hall, PT;Caroline Adriana Simasook, PT;Blair Hobble, PT OT Present: Bretta BangKris Gellert, OT SLP Present: Fae PippinMelissa Bowie, SLP PPS Coordinator present : Edson SnowballBecky Windsor, Chapman FitchPT;Marie Noel, RN, CRRN        Current Status/Progress  Goal  Weekly Team Focus   Medical     severe deconditioning hip weakness, question CIM  maintain stability  manage cough   Bowel/Bladder     Colostomy bag/ bladder  BSC  Skin around stoma free from breakdown/infection  Continue to monitor consistency of stool. Assess skin around the stoma   Swallow/Nutrition/ Hydration     regular solids, thin liquids intermittent  suprevision   supervision with least restrictive diet  diet tolerance, discharge supervision recommendations   ADL's     mod - max overall, progressing with endurance, mobility, use of BUEs  min A overall  ADL retraining, functional mobility training, pt/family education   Mobility     Bed mobility Mod A, Min A sliding baord transfers, Mod A gait with RW x12,' memory and processing improving  Supervision trnasfers, bed moblity, and gait x50'  activity tolerance, strength, transfers, and gait    Communication     n/a  n/a  n/a   Safety/Cognition/ Behavioral Observations    min-mod assist   min assist   carryover of compensatory strategies for problem solving, awareness, memory   Pain     n/a. Tylenol 650 mg PRN q 4 hr.  Pain level 3 or less on a scale of 0-10.  Monitor any onset of pain   Skin     Multiple open areas on skin.  No new skin breakdown/infection  Assess skin q shift.     *See Care Plan and progress notes for long and short-term goals.    Barriers to Discharge:  morbid obesity, multi med issues      Possible Resolutions to Barriers:    cont rehab, adequate LOS      Discharge Planning/Teaching Needs:    Family to provide 24 hr care-committed to pt and will need to begin education soon, much to  learn      Team Discussion:    WOC beginning colostomy teaching with family.  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  Regaining strength slowly.  Family aware 24 hr care   Revisions to Treatment Plan:    None    Continued Need for Acute Rehabilitation Level of Care: The patient requires daily medical management by a physician with specialized training in physical medicine and rehabilitation for the following conditions: Daily direction of a multidisciplinary physical rehabilitation program to ensure safe treatment while eliciting the highest outcome that is of practical value to the patient.: Yes Daily medical management of patient stability for increased activity during  participation in an intensive rehabilitation regime.: Yes Daily analysis of laboratory values and/or radiology reports with any subsequent need for medication adjustment of medical intervention for : Pulmonary problems;Post surgical problems;Other  Lucy Chris 11/30/2013, 4:12 PM          Patient ID: Sarah Cabal, female   DOB: Dec 08, 1945, 68 y.o.   MRN: [id number]

## 2013-11-30 NOTE — Progress Notes (Signed)
Physical Therapy Session Note  Patient Details  Name: Sarah Tran MRN: [id number] Date of Birth: Sep 14, 1945  Today's Date: 11/30/2013 Time: 0900-1000, 1430-1520 Time Calculation (min): 60 min, 50 min Missed 10 min PM due to fatigue  Short Term Goals: Week 2:  PT Short Term Goal 1 (Week 2): Pt will perform sit>supine with Min A PT Short Term Goal 2 (Week 2): Pt will perform bed<>chair transfers with min A  PT Short Term Goal 3 (Week 2): Pt will ambulate 73' with min A PT Short Term Goal 4 (Week 2): Pt will perform 3 stairs with Mod A  PT Short Term Goal 5 (Week 2): Pt will stand x20min with Supervision  Skilled Therapeutic Interventions/Progress Updates:  Tx 1:  Tx focused on sitting balance, gait with bari RW, toilet and basic transfer, strengthening, safety.  Pt with delayed processing for all simple commands; she is unable to keep track of counting repetitions if isometric hold is involved. Pt continues to demonstrate poor memory for safe hand placement on Rw during transfer.  Pt had 1 LOB while trying to pull up panties during toileting, requiring mod assist.  Therapeutic exercise performed with bil LE to increase strength for functional mobility: in sitting- 2 x 10 alternating hip flexion, heel raises, 1 x 10 each alternating knee ext, bil hip add against resistance with isometric hold at end range. Propelled w/c using bil LEs x 60' with supervision, with bil UEs x 50' with min assist for steering due to veering R.  Gait with bari RW x 44' on flat tile in controlled quiet environement, with 3 brief standing rest breaks.  Pt did not need cues for sequencing today.  Mod assist sit> stand, then min guard assist without need for 2nd therapist to bring w/c behind her.  Sit> stand from 22" high w/c with mod assist; from 24" high raised bed with min/mod assist.    Will request measurements of home furniture.  Tx 2:  Pt;s brother Sarah Tran observed tx.  He stated ramp will be built this  week.  He will measure height of bed, sofa, width of doorways at her home.  Toilet transfer with w/c into BR, wall bar with min assist, without RW. Pt had LOB backward when attempting to pull up panties with bil hands at the same time.  Gait with RW x 44' very slowly, as above, with 2 brief standing rest breaks, cues for upright posture and forward gaze.  Therapeutic activity in sitting in w/c, back unsupported to engage core and back extensors while tapping a beach ball with 1# weighted bar, and striking a beach ball with bat,  L and R orientation.    Therapy Documentation Precautions:  Precautions Precautions: Fall Precaution Comments: Pt with colostomy, PEG, mid abdominal wound site, healing trach site with small air leak; previous R TKA Restrictions Weight Bearing Restrictions: No     Locomotion : Ambulation Ambulation/Gait Assistance: 4: Min assist    See FIM for current functional status  Therapy/Group: Individual Therapy  Susa Loffler 11/30/2013, 3:40 PM

## 2013-11-30 NOTE — Patient Care Conference (Signed)
Inpatient RehabilitationTeam Conference and Plan of Care Update Date: 11/30/2013   Time: 10;45 Am    Patient Name: Sarah Tran      Medical Record Number: [id number]  Date of Birth: 1945-11-23 Sex: Female         Room/Bed: 4W02C/4W02C-01 Payor Info: Payor: MEDICARE / Plan: MEDICARE PART A AND B / Product Type: *No Product type* /    Admitting Diagnosis: Deconditioned   Admit Date/Time:  11/22/2013  4:33 PM Admission Comments: No comment available   Primary Diagnosis:  <principal problem not specified> Principal Problem: <principal problem not specified>  Patient Active Problem List   Diagnosis Date Noted  . Physical deconditioning 11/22/2013  . Acute on chronic respiratory failure 10/26/2013  . 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 Toxic metabolic encephalopathy 10/26/2013  . Hypervolemia 10/26/2013  . Acute on chronic renal insufficiency 10/26/2013  . DM2 (diabetes mellitus, type 2) 10/26/2013  . 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 Claudette Laws Social Worker Present: Dossie Der, LCSW Nurse Present: Ronny Bacon, RN PT Present: Edman Circle, PT;Caroline Adriana Simas, PT;Blair Hobble, PT OT Present: Bretta Bang, OT SLP Present: Fae Pippin, SLP PPS Coordinator present : Edson Snowball, Chapman Fitch, RN, CRRN     Current Status/Progress Goal Weekly Team Focus  Medical   severe deconditioning hip weakness, question CIM  maintain stability  manage cough   Bowel/Bladder   Colostomy bag/ bladder  BSC  Skin around stoma free from breakdown/infection  Continue to monitor consistency of stool. Assess skin around the stoma   Swallow/Nutrition/ Hydration   regular solids, thin liquids intermittent suprevision   supervision with least restrictive diet  diet tolerance, discharge supervision recommendations   ADL's   mod - max overall, progressing with  endurance, mobility, use of BUEs  min A overall  ADL retraining, functional mobility training, pt/family education   Mobility   Bed mobility Mod A, Min A sliding baord transfers, Mod A gait with RW x12,' memory and processing improving  Supervision trnasfers, bed moblity, and gait x50'  activity tolerance, strength, transfers, and gait    Communication   n/a  n/a  n/a   Safety/Cognition/ Behavioral Observations  min-mod assist   min assist   carryover of compensatory strategies for problem solving, awareness, memory   Pain   n/a. Tylenol 650 mg PRN q 4 hr.  Pain level 3 or less on a scale of 0-10.  Monitor any onset of pain   Skin   Multiple open areas on skin.  No new skin breakdown/infection  Assess skin q shift.      *See Care Plan and progress notes for long and short-term goals.  Barriers to Discharge: morbid obesity, multi med issues    Possible Resolutions to Barriers:  cont rehab, adequate LOS    Discharge Planning/Teaching Needs:  Family to provide 24 hr care-committed to pt and will need to begin education soon, much to learn      Team Discussion:  WOC beginning colostomy teaching with family.  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  Regaining strength slowly.  Family aware 24 hr care  Revisions to Treatment Plan:  None   Continued Need for Acute Rehabilitation Level of Care: The patient requires daily medical management by a physician with specialized training in physical medicine and rehabilitation for the following conditions: Daily direction of a multidisciplinary physical rehabilitation program to ensure safe treatment while  eliciting the highest outcome that is of practical value to the patient.: Yes Daily medical management of patient stability for increased activity during participation in an intensive rehabilitation regime.: Yes Daily analysis of laboratory values and/or radiology reports with any subsequent need for medication adjustment of medical  intervention for : Pulmonary problems;Post surgical problems;Other  Lemar LivingsRebecca G Temeka Pore 11/30/2013, 4:12 PM

## 2013-12-01 ENCOUNTER — Inpatient Hospital Stay (HOSPITAL_COMMUNITY): Payer: Medicare Other | Admitting: Occupational Therapy

## 2013-12-01 ENCOUNTER — Inpatient Hospital Stay (HOSPITAL_COMMUNITY): Payer: Medicare Other | Admitting: Speech Pathology

## 2013-12-01 ENCOUNTER — Inpatient Hospital Stay (HOSPITAL_COMMUNITY): Payer: Medicare Other | Admitting: Physical Therapy

## 2013-12-01 ENCOUNTER — Inpatient Hospital Stay (HOSPITAL_COMMUNITY): Payer: Medicare Other

## 2013-12-01 DIAGNOSIS — E119 Type 2 diabetes mellitus without complications: Secondary | ICD-10-CM

## 2013-12-01 DIAGNOSIS — N289 Disorder of kidney and ureter, unspecified: Secondary | ICD-10-CM

## 2013-12-01 DIAGNOSIS — J9620 Acute and chronic respiratory failure, unspecified whether with hypoxia or hypercapnia: Secondary | ICD-10-CM

## 2013-12-01 DIAGNOSIS — R5381 Other malaise: Secondary | ICD-10-CM

## 2013-12-01 LAB — GLUCOSE, CAPILLARY
GLUCOSE-CAPILLARY: 77 mg/dL (ref 70–99)
GLUCOSE-CAPILLARY: 111 mg/dL — AB (ref 70–99)
Glucose-Capillary: 88 mg/dL (ref 70–99)
Glucose-Capillary: 128 mg/dL — ABNORMAL HIGH (ref 70–99)

## 2013-12-01 LAB — PROTIME-INR
INR: 2.73 — ABNORMAL HIGH (ref 0.00–1.49)
PROTHROMBIN TIME: 28 s — AB (ref 11.6–15.2)

## 2013-12-01 MED ORDER — WARFARIN SODIUM 5 MG PO TABS
5.0000 mg | ORAL_TABLET | Freq: Once | ORAL | Status: AC
  Administered 2013-12-01: 5 mg via ORAL
  Filled 2013-12-01: qty 1

## 2013-12-01 NOTE — Progress Notes (Signed)
Subjective/Complaints: 68 year old right-handed morbidly obese African American female. Past medical history of hypertension, renal insufficiency, obstructive sleep apnea with CPAP, DVT/A. fib with chronic Coumadin. Hospital course [facility] for removal of a ileal polyp. Noted perioperative complications balloon catheter became dislodged. She required exploratory laparotomy and diverting colostomy as well as appendectomy secondary to perforated sigmoid colon. Her hospital was complicated by respiratory failure requiring intubation and eventually tracheostomy as well his gastrostomy tube for nutritional support. She remained in intensive care for 28 days. Patient developed sepsis cultures growing gram-positive cocci in clusters maintained on broad-spectrum antibiotics. [facility] for further vent weaning and wound care. An echocardiogram is completed 10/28/2013 showing mild LVH with ejection fraction of 55-60%. 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 Hospital course complicated by acute renal insufficiency with creatinine 2.51, A. fib with RVR, question angioedema from Invanz reaction and wound dehiscence. Patient's tracheostomy tube was downsized and later be cannulated 11/18/2013.   No new issues overnite No SOB Delayed responses  Review of Systems - otherwise negative Objective: Vital Signs: Blood pressure 161/75, pulse 98, temperature 98.5 F (36.9 C), temperature source Oral, resp. rate 18, height 5' 2.99" (1.6 m), weight 105.1 kg (231 lb 11.3 oz), SpO2 99.00%. No results found. Results for orders placed during the hospital encounter of 11/22/13 (from the past 72 hour(s))  GLUCOSE, CAPILLARY     Status: None   Collection Time    11/28/13  4:22 PM      Result  Value Ref Range   Glucose-Capillary 96  70 - 99 mg/dL  GLUCOSE, CAPILLARY     Status: None   Collection Time    11/28/13  9:29 PM      Result Value Ref Range   Glucose-Capillary 95  70 - 99 mg/dL  GLUCOSE, CAPILLARY     Status: Abnormal   Collection Time    11/29/13  7:20 AM      Result Value Ref Range   Glucose-Capillary 52 (*) 70 - 99 mg/dL   Comment 1 Notify RN    PROTIME-INR     Status: Abnormal   Collection Time    11/29/13  8:40 AM      Result Value Ref Range   Prothrombin Time 21.2 (*) 11.6 - 15.2 seconds   INR 1.90 (*) 0.00 - 1.49  GLUCOSE, CAPILLARY     Status: Abnormal   Collection Time    11/29/13  8:43 AM      Result Value Ref Range   Glucose-Capillary 53 (*) 70 - 99 mg/dL   Comment 1 Notify RN    GLUCOSE, CAPILLARY     Status: None   Collection Time    11/29/13  9:11 AM      Result Value Ref Range   Glucose-Capillary 75  70 - 99 mg/dL   Comment 1 Notify RN    GLUCOSE, CAPILLARY     Status: Abnormal   Collection Time    11/29/13 11:40 AM      Result Value Ref Range   Glucose-Capillary 146 (*) 70 - 99 mg/dL   Comment 1 Notify RN    GLUCOSE, CAPILLARY     Status: Abnormal   Collection Time    11/29/13  4:44 PM      Result Value Ref Range   Glucose-Capillary 125 (*) 70 -  99 mg/dL  GLUCOSE, CAPILLARY     Status: Abnormal   Collection Time    11/29/13  8:59 PM      Result Value Ref Range   Glucose-Capillary 136 (*) 70 - 99 mg/dL  PROTIME-INR     Status: Abnormal   Collection Time    11/30/13  6:03 AM      Result Value Ref Range   Prothrombin Time 24.1 (*) 11.6 - 15.2 seconds   INR 2.24 (*) 0.00 - 1.49  GLUCOSE, CAPILLARY     Status: Abnormal   Collection Time    11/30/13  7:23 AM      Result Value Ref Range   Glucose-Capillary 68 (*) 70 - 99 mg/dL   Comment 1 Notify RN    GLUCOSE, CAPILLARY     Status: None   Collection Time    11/30/13  7:45 AM      Result Value Ref Range   Glucose-Capillary 75  70 - 99 mg/dL   Comment 1 Notify RN    GLUCOSE,  CAPILLARY     Status: Abnormal   Collection Time    11/30/13 11:26 AM      Result Value Ref Range   Glucose-Capillary 57 (*) 70 - 99 mg/dL   Comment 1 Notify RN    GLUCOSE, CAPILLARY     Status: None   Collection Time    11/30/13 11:56 AM      Result Value Ref Range   Glucose-Capillary 83  70 - 99 mg/dL   Comment 1 Notify RN    GLUCOSE, CAPILLARY     Status: Abnormal   Collection Time    11/30/13  5:04 PM      Result Value Ref Range   Glucose-Capillary 204 (*) 70 - 99 mg/dL  GLUCOSE, CAPILLARY     Status: Abnormal   Collection Time    11/30/13  9:24 PM      Result Value Ref Range   Glucose-Capillary 135 (*) 70 - 99 mg/dL  PROTIME-INR     Status: Abnormal   Collection Time    12/01/13  5:07 AM      Result Value Ref Range   Prothrombin Time 28.0 (*) 11.6 - 15.2 seconds   INR 2.73 (*) 0.00 - 1.49  GLUCOSE, CAPILLARY     Status: None   Collection Time    12/01/13  7:17 AM      Result Value Ref Range   Glucose-Capillary 77  70 - 99 mg/dL   Comment 1 Notify RN    GLUCOSE, CAPILLARY     Status: None   Collection Time    12/01/13 12:09 PM      Result Value Ref Range   Glucose-Capillary 88  70 - 99 mg/dL   Comment 1 Notify RN       HEENT: healing trach site with air leak  General: No acute distress Mood and affect are appropriate Heart: Regular rate and rhythm no rubs murmurs or extra sounds Lungs: Clear to auscultation, breathing unlabored, no rales or wheezes Abdomen: Positive bowel sounds, soft nontender to palpation, nondistended. Extremities: No clubbing, cyanosis, or edema, G tube site healed Skin: No evidence of breakdown, no evidence of rash Neurologic: Cranial nerves II through XII intact, motor strength is 3/5 in bilateral deltoid, 4/5 bicep, tricep, grip, 3-/5 R hip and Knee flexor,4- knee extensors,3- Left Hip flexor  ankle dorsiflexor and plantar flexor    Musculoskeletal: Tenderness mild right Achilles  Assessment/Plan: 1. Functional deficits  secondary to  Critical illness myopathy encephalopathy which require 3+ hours per day of interdisciplinary therapy in a comprehensive inpatient rehab setting. Physiatrist is providing close team supervision and 24 hour management of active medical problems listed below. Physiatrist and rehab team continue to assess barriers to discharge/monitor patient progress toward functional and medical goals.  FIM: FIM - Bathing Bathing Steps Patient Completed: Chest;Right Arm;Left Arm;Abdomen;Right upper leg;Left upper leg;Front perineal area;Buttocks Bathing: 4: Min-Patient completes 8-9 28f 10 parts or 75+ percent  FIM - Upper Body Dressing/Undressing Upper body dressing/undressing steps patient completed: Thread/unthread right sleeve of pullover shirt/dresss;Thread/unthread left sleeve of pullover shirt/dress;Put head through opening of pull over shirt/dress;Pull shirt over trunk Upper body dressing/undressing: 5: Supervision: Safety issues/verbal cues FIM - Lower Body Dressing/Undressing Lower body dressing/undressing steps patient completed: Thread/unthread right underwear leg;Thread/unthread left underwear leg;Thread/unthread right pants leg;Thread/unthread left pants leg;Pull pants up/down Lower body dressing/undressing: 3: Mod-Patient completed 50-74% of tasks  FIM - Toileting Toileting steps completed by patient: Performs perineal hygiene Toileting: 2: Max-Patient completed 1 of 3 steps  FIM - Diplomatic Services operational officer Devices: Environmental consultant;Bedside commode (BSC over toilet) Toilet Transfers: 4-To toilet/BSC: Min A (steadying Pt. > 75%);3-From toilet/BSC: Mod A (lift or lower assist)  FIM - Banker Devices: Walker;Arm rests Bed/Chair Transfer: 3: Bed > Chair or W/C: Mod A (lift or lower assist);3: Chair or W/C > Bed: Mod A (lift or lower assist)  FIM - Locomotion: Wheelchair Distance: 40 Locomotion: Wheelchair: 1: Total Assistance/staff pushes  wheelchair (Pt<25%) FIM - Locomotion: Ambulation Locomotion: Ambulation Assistive Devices: Designer, industrial/product Ambulation/Gait Assistance: 4: Min assist Locomotion: Ambulation: 0: Activity did not occur  Comprehension Comprehension Mode: Auditory Comprehension: 5-Follows basic conversation/direction: With extra time/assistive device  Expression Expression Mode: Verbal Expression: 5-Expresses basic 90% of the time/requires cueing < 10% of the time.  Social Interaction Social Interaction: 6-Interacts appropriately with others with medication or extra time (anti-anxiety, antidepressant).  Problem Solving Problem Solving: 4-Solves basic 75 - 89% of the time/requires cueing 10 - 24% of the time  Memory Memory: 3-Recognizes or recalls 50 - 74% of the time/requires cueing 25 - 49% of the time   Medical Problem List and Plan:  1. Functional deficits secondary to deconditioning related to respiratory failure multi-medical as above  2. DVT Prophylaxis/Anticoagulation: Subcutaneous heparin discontinued and chronic Coumadin resumed 11/22/2013 without bridging. . Doppler showed possible Left popliteal DVT in a technically limited study, no progressive symptoms. Continue low dose SQ heparin until INR therapeutic 3. Pain Management: Oxycodone immediate release 5 mg every 6 hours as needed. Added kpad for right thigh 4. Mood: Clonazepam 0.5 mg 3 times a day. positive reinforcement  5. Neuropsych: This patient is capable of making decisions on her own behalf.  6. 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 occ cough will monitor start mucolytic 7. Dysphagia. Mechanical soft diet. Gastrostomy tube feeds on hold. Followup speech therapy on advancing diet  8. Diabetes mellitus. Glipizide reduced to 5mg  on 5/19 ,titrate  Checking blood sugars a.c. and at bedtime. Sugars low in am. D/C levemir 5/18 9. Hypertension/atrial fibrillation.Uncontrolled Lasix 40 mg daily, Norvasc 10 mg daily,  Given HR in 80s increase  Coreg 12.5 mg twice a day  10. Hypothyroidism. Tapazole 5 mg daily. Latest TSH level 2.230  11. Chronic anemia. Last hgb 8.7 monitor, check stool guaic 12. Obstructive sleep apnea. Resumed CPAP.--pt seems to be tolerating well  LOS (Days) 9 A FACE TO FACE EVALUATION WAS PERFORMED  Erick Colace 12/01/2013, 2:50 PM

## 2013-12-01 NOTE — Progress Notes (Signed)
Physical Therapy Session Note  Patient Details  Name: Sarah Tran MRN: [id number] Date of Birth: May 09, 1946  Today's Date: 12/01/2013 Time: 1118-1202 Time Calculation (min): 44 min  Short Term Goals: Week 2:  PT Short Term Goal 1 (Week 2): Pt will perform sit>supine with Min A PT Short Term Goal 2 (Week 2): Pt will perform bed<>chair transfers with min A  PT Short Term Goal 3 (Week 2): Pt will ambulate 71' with min A PT Short Term Goal 4 (Week 2): Pt will perform 3 stairs with Mod A  PT Short Term Goal 5 (Week 2): Pt will stand x58min with Supervision  Skilled Therapeutic Interventions/Progress Updates:    Pt received seated in w/c with brother present. Pt agreeable to session. Transported pt in w/c to treatment gym with total A for energy conservation. Stand pivot transfer from w/c<>mat table with bariatric rolling walker and mod A for sit>stand; verbal cueing for hand placement with inconsistent within-session carryover. See below for detailed description of therapeutic exercises. Performed final sit>stand from w/c with bariatric rolling walker with min A. Transported pt in w/c to room secondary to pt fatigue Therapist departed with pt seated in w/c with all needs within reach.  Therapy Documentation Precautions:  Precautions Precautions: Fall Precaution Comments: Pt with colostomy, PEG, mid abdominal wound site, healing trach site with small air leak; previous R TKA Restrictions Weight Bearing Restrictions: No Pain: Pain Assessment Pain Assessment: 0-10 Pain Score: 4  Pain Type: Chronic pain Pain Location: Knee Pain Orientation: Right Pain Descriptors / Indicators: Aching Pain Onset: On-going Patients Stated Pain Goal: 2 Pain Intervention(s):  (RN provided medication pre-session to address pain) Multiple Pain Sites: No Exercises: - Graded sit<>stand transfers x9 reps total (rest breaks x4) from progressively lower mat table height with bariatric rolling walker and supervision  to min A.  - Static standing x65 seconds without UE support with close supervision.  - Seated: hip adductor pillow squeezes x10 reps (x2-second holds). - Seated: LAQ x12 reps on L; 10 reps on R (within pain-free parameters)   See FIM for current functional status  Therapy/Group: Individual Therapy  Lorenda Ishihara [place] 12/01/2013, 12:13 PM

## 2013-12-01 NOTE — Progress Notes (Signed)
Speech Language Pathology Daily Session Note  Patient Details  Name: Sarah Tran MRN: [id number] Date of Birth: [birth year]  Today's Date: 12/01/2013 Time: 0830-0900 Time Calculation (min): 30 min  Short Term Goals: Week 2: SLP Short Term Goal 1 (Week 2): Pt will utilize safe swallowing strategies with recommended diet with modified independence SLP Short Term Goal 2 (Week 2): Pt will demonstrate basic problem solving during familiar, functional tasks with min cues SLP Short Term Goal 3 (Week 2): Pt will verbalize at least at least 1 physical and 2 cognitive impairments with min  cues SLP Short Term Goal 4 (Week 2): Pt will utilize exteral memory aids to facilitate recall of new and daily information with Min cues SLP Short Term Goal 5 (Week 2): Pt will improve alternating/divided attention to functional task for 10 minutes with Min cues  Skilled Therapeutic Interventions: Pt was seen for skilled speech therapy targeting dysphagia management and memory.  Pt was observed with presentations of her prescribed diet with no overt s/s of aspiration.  Furthermore, pt was noted with modified independent tray set up to facilitate improved functional independence when self-feeding.  Furthermore, pt independently recalled  compensatory strategies for memory from previous therapy sessions, improving to 4/4 with min assist.  Continue per current plan of care.       FIM:  Comprehension Comprehension Mode: Auditory Comprehension: 5-Follows basic conversation/direction: With no assist Expression Expression Mode: Verbal Expression: 5-Expresses basic needs/ideas: With no assist Social Interaction Social Interaction: 6-Interacts appropriately with others with medication or extra time (anti-anxiety, antidepressant). Problem Solving Problem Solving: 5-Solves basic 90% of the time/requires cueing < 10% of the time Memory Memory: 4-Recognizes or recalls 75 - 89% of the time/requires cueing 10 - 24% of the  time FIM - Eating Eating Activity: 6: More than reasonable amount of time  Pain Pain Assessment Pain Assessment: No/denies pain  Therapy/Group: Individual Therapy  Jackalyn Lombard, M.A. CCC-SLP  Melanee Spry Lesslie Mckeehan 12/01/2013, 5:22 PM

## 2013-12-01 NOTE — Progress Notes (Signed)
Occupational Therapy Weekly Progress Note  Patient Details  Name: Sarah Tran MRN: [id number] Date of Birth: 07-01-1946  Beginning of progress report period: Nov 23, 2013 End of progress report period: Dec 01, 2013  Today's Date: 12/01/2013  Patient has met 4 of 4 short term goals.  Pt has been making excellent progress. She has made great strides with her mobility of sit to stand, standing, transfers and ambulation. She is also much more independent with her self care.  Patient continues to demonstrate the following deficits: decreased activity tolerance, decreased balance with LB ADLS, decreased LE flexibility for LB dressing/ bathing and therefore will continue to benefit from skilled OT intervention to enhance overall performance with BADL.  Patient progressing toward long term goals..  Plan of care revisions: LTG of toileting upgraded to mod I, toilet transfers to supervision..  OT Short Term Goals Week 1:  OT Short Term Goal 1 (Week 1): Pt will be able to stand at EOB with RW for 1 min with mod A to be able to pull pants over hips. OT Short Term Goal 1 - Progress (Week 1): Met OT Short Term Goal 2 (Week 1): Pt will don shirt with min A. OT Short Term Goal 2 - Progress (Week 1): Met OT Short Term Goal 3 (Week 1): Pt will demonstrate improved BUE shoulder AROM to lift her arms to be able to wash under them without assist. OT Short Term Goal 3 - Progress (Week 1): Met OT Short Term Goal 4 (Week 1): Pt will transfer to drop arm BSC with mod A x1.  OT Short Term Goal 4 - Progress (Week 1): Met Week 2:  OT Short Term Goal 1 (Week 2): Pt will be able to ambulate in and out of bathroom with RW with steady A.  OT Short Term Goal 2 (Week 2): Pt will be able to toilet with mod I.  OT Short Term Goal 3 (Week 2): Pt will be able to dress LB with set up except for shoes and socks. OT Short Term Goal 4 (Week 2): Pt will be able to transfer on and off a tub bench (simulated with clothing on) with  min A.  Skilled Therapeutic Interventions/Progress Updates: Continue OT 5-7 days a week 1-2x a day for 60-90 minutes with Balance/vestibular training;Cognitive remediation/compensation;Discharge planning;DME/adaptive equipment instruction;Functional mobility training;Patient/family education;Psychosocial support;Self Care/advanced ADL retraining;Therapeutic Activities;Therapeutic Exercise;UE/LE Strength taining/ROM;UE/LE Coordination activitiesto maximize her independence with ADLs.   Therapy Documentation Precautions:  Precautions Precautions: Fall Precaution Comments: Pt with colostomy, PEG, mid abdominal wound site, healing trach site with small air leak; previous R TKA Restrictions Weight Bearing Restrictions: No     ADL: ADL ADL Comments: Refer to [place] 12/01/2013, 3:24 PM

## 2013-12-01 NOTE — Progress Notes (Signed)
Physical Therapy Session Note  Patient Details  Name: Sarah Tran MRN: [id number] Date of Birth: [birth year]  Today's Date: 12/01/2013 Time: 5681-2751 Time Calculation (min): 45 min  Short Term Goals: Week 2:  PT Short Term Goal 1 (Week 2): Pt will perform sit>supine with Min A PT Short Term Goal 2 (Week 2): Pt will perform bed<>chair transfers with min A  PT Short Term Goal 3 (Week 2): Pt will ambulate 25' with min A PT Short Term Goal 4 (Week 2): Pt will perform 3 stairs with Mod A  PT Short Term Goal 5 (Week 2): Pt will stand x18min with Supervision    Skilled Therapeutic Interventions/Progress Updates:   Bed> w/c stand step transfer with RW, min guard assist with minimal raising of bed.  Toilet transfer using RW, not wall bar, mod assist.  W/c propulsion in room in congested area with mod assist for turning.  Therapeutic exercise performed with LEs to increase strength for functional mobility: in standing with RW : step/taps onto 2" high stool, 10 x 1 R and L. R foot cleared edge completely 3/10 trials, L cleared 10/10.  Gait with RW x 45' with min assist once stnding. Mod assist to come to standing from 22" high w/c.  Therapy Documentation Precautions:  Precautions Precautions: Fall Precaution Comments: Pt with colostomy, PEG, mid abdominal wound site, healing trach site with small air leak; previous R TKA Restrictions Weight Bearing Restrictions: No     See FIM for current functional status  Therapy/Group: Individual Therapy  Susa Loffler 12/01/2013, 5:04 PM

## 2013-12-01 NOTE — Progress Notes (Signed)
ANTICOAGULATION CONSULT NOTE - Follow Up Consult  Pharmacy Consult for coumadin Indication: afib and hx of DVT  Allergies  Allergen Reactions  . Codeine Other (See Comments)    Pass out    . Peanut Butter Flavor Hives and Other (See Comments)    Lockjaw   . Penicillins Swelling    Patient Measurements: Height: 5' 2.99" (160 cm) Weight: 231 lb 11.3 oz (105.1 kg) IBW/kg (Calculated) : 52.38   Vital Signs: Temp: 98.3 F (36.8 C) (05/21 0550) Temp src: Oral (05/21 0550) BP: 148/82 mmHg (05/21 0550) Pulse Rate: 93 (05/21 0550)  Labs:  Recent Labs  11/29/13 0840 11/30/13 0603 12/01/13 0507  LABPROT 21.2* 24.1* 28.0*  INR 1.90* 2.24* 2.73*    Estimated Creatinine Clearance: 20.6 ml/min (by C-G formula based on Cr of 3.16).   Medications:  Scheduled:  . atorvastatin  10 mg Oral q1800  . carvedilol  12.5 mg Oral BID WC  . clonazePAM  0.5 mg Oral BID  . famotidine  20 mg Oral Daily  . furosemide  40 mg Oral Daily  . glipiZIDE  5 mg Oral QAC breakfast  . lactobacillus  1 g Oral TID WC  . methimazole  5 mg Oral Daily  . nitroGLYCERIN  0.2 mg Transdermal Daily  . potassium chloride  40 mEq Oral Daily  . predniSONE  15 mg Oral Q breakfast  . senna-docusate  1 tablet Oral BID  . sodium bicarbonate  1,300 mg Oral BID  . timolol  1 drop Both Eyes BID  . vitamin C  500 mg Oral BID  . Warfarin - Pharmacist Dosing Inpatient   Does not apply q1800  . zinc sulfate  220 mg Oral Daily   Infusions:    Assessment: 68 yo morbidly obese female with afib and hx of DVT continues on chronic coumadin.  INR remains therapeutic today with INR = 2.73. INR has increased from 2.24 yesterday. Off the SQ heparin -discontinued on 11/30/13.  Sarah bleeding noted. I will lower coumadin dose today to 5 mg.   Patient with chronic anemia hemoglobin varying from 8.4-8.9. Patient's chronic Coumadin for history of DVT/atrial fibrillation was resumed 11/22/2013.  Patient is s/p a complicated hospital  course at [facility] starting 09/28/13 then transferred to [facility]/15[facility]. Admitted to rehab unit on 11/22/13.    Goal of Therapy:  INR 2-3 Monitor platelets by anticoagulation protocol: Yes   Plan:  Coumadin 5 mg PO today. Daily PT / INR   Noah Delaine, RPh Clinical Pharmacist Pager: [phone] 12/01/2013,11:27 AM

## 2013-12-01 NOTE — Progress Notes (Signed)
Occupational Therapy Session Note  Patient Details  Name: Sarah Tran MRN: [id number] Date of Birth: [birth year]  Today's Date: 12/01/2013 Time: 1030-1115 and 1330-1400 Time Calculation (min): 45 min and 30 min  Short Term Goals: Week 1:  OT Short Term Goal 1 (Week 1): Pt will be able to stand at EOB with RW for 1 min with mod A to be able to pull pants over hips. OT Short Term Goal 2 (Week 1): Pt will don shirt with min A. OT Short Term Goal 3 (Week 1): Pt will demonstrate improved BUE shoulder AROM to lift her arms to be able to wash under them without assist. OT Short Term Goal 4 (Week 1): Pt will transfer to drop arm BSC with mod A x1.   Skilled Therapeutic Interventions/Progress Updates:    Visit 1: Pain: 4/10 Right knee - premedicated Pt seen for BADL retraining with a focus on use of AE and activity tolerance.  Pt was in w/c at start of session. Pt was encouraged to do her own ostomy care prior to bathing and to practice directing her own care by explaining to this clinician what needed to be done. Pt did 90% of care with this clinician holding the cup for pt while pt emptied bag. Pt used syringe to place fresh water into bag to rinse it out. Pt then continued with bath and dressing using reacher to don clothing over feet. Pt is now able to stand to sink with min A and improved balance to pull her shorts up.  Pt resting in w/c at end of session with call light and phone in reach.  Visit 2:  Pain: No c/o pain. Pt seen this session for toilet transfer training with RW. Pt did well standing from [facility] over toilet to Rw and stepping to and from w/c. She was also able to manage her clothing over her hips.  Pt's chair was then placed 10 ft from her bed where she walked to her bed. She then worked on standing endurance and balance. Pt was able to stand 5 min working on hip sways, trunk rotation, forward tapping with feet, and heel raises. From EOB pt worked on BLE AROM exercises to be able to pick up  her legs more effectively onto the bed. Pt wanted to rest in bed, mod A into bed to liftt BLE. Pt resting with phone and call light in reach.    Therapy Documentation Precautions:  Precautions Precautions: Fall Precaution Comments: Pt with colostomy, PEG, mid abdominal wound site, healing trach site with small air leak; previous R TKA Restrictions Weight Bearing Restrictions: No    ADL: ADL ADL Comments: Refer to FIM See FIM for current functional status  Therapy/Group: Individual Therapy  Earle Gell 12/01/2013, 12:32 PM

## 2013-12-02 ENCOUNTER — Inpatient Hospital Stay (HOSPITAL_COMMUNITY): Payer: Medicare Other | Admitting: Speech Pathology

## 2013-12-02 ENCOUNTER — Inpatient Hospital Stay (HOSPITAL_COMMUNITY): Payer: Medicare Other | Admitting: Occupational Therapy

## 2013-12-02 ENCOUNTER — Inpatient Hospital Stay (HOSPITAL_COMMUNITY): Payer: Medicare Other | Admitting: *Deleted

## 2013-12-02 ENCOUNTER — Encounter (HOSPITAL_COMMUNITY): Payer: Medicare Other | Admitting: Occupational Therapy

## 2013-12-02 LAB — GLUCOSE, CAPILLARY
GLUCOSE-CAPILLARY: 155 mg/dL — AB (ref 70–99)
Glucose-Capillary: 100 mg/dL — ABNORMAL HIGH (ref 70–99)
Glucose-Capillary: 74 mg/dL (ref 70–99)
Glucose-Capillary: 106 mg/dL — ABNORMAL HIGH (ref 70–99)

## 2013-12-02 LAB — PROTIME-INR
INR: 2.77 — ABNORMAL HIGH (ref 0.00–1.49)
Prothrombin Time: 28.3 seconds — ABNORMAL HIGH (ref 11.6–15.2)

## 2013-12-02 MED ORDER — WARFARIN SODIUM 7.5 MG PO TABS
7.5000 mg | ORAL_TABLET | Freq: Once | ORAL | Status: AC
  Administered 2013-12-02: 7.5 mg via ORAL
  Filled 2013-12-02: qty 1

## 2013-12-02 NOTE — Progress Notes (Signed)
Occupational Therapy Session Note  Patient Details  Name: Sarah Tran MRN: [id number] Date of Birth: 06/06/1946  Today's Date: 12/02/2013 Time: 0165-5374 Time Calculation (min): 54 min  Short Term Goals: Week 1:  OT Short Term Goal 1 (Week 1): Pt will be able to stand at EOB with RW for 1 min with mod A to be able to pull pants over hips. OT Short Term Goal 1 - Progress (Week 1): Met OT Short Term Goal 2 (Week 1): Pt will don shirt with min A. OT Short Term Goal 2 - Progress (Week 1): Met OT Short Term Goal 3 (Week 1): Pt will demonstrate improved BUE shoulder AROM to lift her arms to be able to wash under them without assist. OT Short Term Goal 3 - Progress (Week 1): Met OT Short Term Goal 4 (Week 1): Pt will transfer to drop arm BSC with mod A x1.  OT Short Term Goal 4 - Progress (Week 1): Met  Skilled Therapeutic Interventions/Progress Updates:    Pt seen for individual OT ADL retraining session at sink level. Pt reports fatigue after physical therapy session just prior to OT. Focused on activity tolerance, endurance, sit to stand transfers. Pt was supervision assist for grooming, Mod A for lower body bathing and mod-max A for sit to stand transfers at sink today, pt c/o difficulty R knee buckling and new w/c "Making it harder to get up today, I'm just so tired." Pt's Right knee was blocked for sit to stand and w/c to bed SPT at conclusion of therapy session. Pt resting in bed w/ call bell & phone in reach and family present.  Therapy Documentation Precautions:  Precautions Precautions: Fall Precaution Comments: Pt with colostomy, PEG, mid abdominal wound site, healing trach site with small air leak; previous R TKA Restrictions Weight Bearing Restrictions: No General:   Vital Signs:   Pain: Pt denies pain this session, she reports fatigue/lethergy after physical therapy this morning.   ADL: ADL ADL Comments: Refer to FIM     See FIM for current functional  status  Therapy/Group: Individual Therapy  Sebastiano Luecke B Kahlin Mark 12/02/2013, 11:45 AM

## 2013-12-02 NOTE — Progress Notes (Signed)
Speech Language Pathology Daily Session Note  Patient Details  Name: Sarah Tran MRN: [id number] Date of Birth: [birth year]  Today's Date: 12/02/2013 Time: 9476-5465 Time Calculation (min): 30 min  Short Term Goals: Week 2: SLP Short Term Goal 1 (Week 2): Pt will utilize safe swallowing strategies with recommended diet with modified independence SLP Short Term Goal 2 (Week 2): Pt will demonstrate basic problem solving during familiar, functional tasks with min cues SLP Short Term Goal 3 (Week 2): Pt will verbalize at least at least 1 physical and 2 cognitive impairments with min  cues SLP Short Term Goal 4 (Week 2): Pt will utilize exteral memory aids to facilitate recall of new and daily information with Min cues SLP Short Term Goal 5 (Week 2): Pt will improve alternating/divided attention to functional task for 10 minutes with Min cues  Skilled Therapeutic Interventions: Pt was seen for skilled speech therapy targeting executive function and memory.  Upon arrival, pt was noted with worsened shortness of breath in comparison to previous therapy session.  Pt reported that she was fatigued from PT/OT therapy sessions earlier this afternoon, O2 sats were within functional limits (97-99).  Pt was agreeable to participate in skilled speech therapy and was noted with improved respiratory status with slow deep breaths.  Pt was 88% independent for thought organization and sorting during a structured categorization task, improving to 100% accuracy with min assist.  Furthermore, pt immediately recalled 12/16 targeted items from the aforementioned categorization task via associations of word lists, which improved to 16/16 with min assist.  Continue per current plan of care.     FIM:  Comprehension Comprehension Mode: Auditory Comprehension: 6-Follows complex conversation/direction: With extra time/assistive device Expression Expression Mode: Verbal Expression: 5-Expresses complex 90% of the time/cues <  10% of the time Social Interaction Social Interaction: 6-Interacts appropriately with others with medication or extra time (anti-anxiety, antidepressant). Problem Solving Problem Solving: 5-Solves basic 90% of the time/requires cueing < 10% of the time Memory Memory: 4-Recognizes or recalls 75 - 89% of the time/requires cueing 10 - 24% of the time  Pain Pain Assessment Pain Assessment: No/denies pain  Therapy/Group: Individual Therapy  Jackalyn Lombard, M.A. CCC-SLP  Melanee Spry Burton Gahan 12/02/2013, 4:54 PM

## 2013-12-02 NOTE — Progress Notes (Signed)
Subjective/Complaints: 68 year old right-handed morbidly obese African American female. Past medical history of hypertension, renal insufficiency, obstructive sleep apnea with CPAP, DVT/A. fib with chronic Coumadin. Hospital course patient admitted to [facility] 09/28/2013 for removal of a ileal polyp. Noted perioperative complications balloon catheter became dislodged. She required exploratory laparotomy and diverting colostomy as well as appendectomy secondary to perforated sigmoid colon. Her hospital was complicated by respiratory failure requiring intubation and eventually tracheostomy as well his gastrostomy tube for nutritional support. She remained in intensive care for 28 days. Patient developed sepsis cultures growing gram-positive cocci in clusters maintained on broad-spectrum antibiotics. [facility] stabilized [facility] for further vent weaning and wound care. An echocardiogram is completed 10/28/2013 showing mild LVH with ejection fraction of 55-60%. 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 Hospital course complicated by acute renal insufficiency with creatinine 2.51, A. fib with RVR, question angioedema from Invanz reaction and wound dehiscence. Patient's tracheostomy tube was downsized and later be cannulated 11/18/2013.   "what happened to me?" No SOB Delayed responses  Review of Systems - otherwise negative Objective: Vital Signs: Blood pressure 154/91, pulse 83, temperature 98.1 F (36.7 C), temperature source Oral, resp. rate 18, height 5' 2.99" (1.6 m), weight 105.1 kg (231 lb 11.3 oz), SpO2 99.00%. No results found. Results for orders placed during the hospital encounter of 11/22/13 (from the past 72 hour(s))  PROTIME-INR     Status: Abnormal   Collection Time    11/29/13  8:40 AM      Result Value  Ref Range   Prothrombin Time 21.2 (*) 11.6 - 15.2 seconds   INR 1.90 (*) 0.00 - 1.49  GLUCOSE, CAPILLARY     Status: Abnormal   Collection Time    11/29/13  8:43 AM      Result Value Ref Range   Glucose-Capillary 53 (*) 70 - 99 mg/dL   Comment 1 Notify RN    GLUCOSE, CAPILLARY     Status: None   Collection Time    11/29/13  9:11 AM      Result Value Ref Range   Glucose-Capillary 75  70 - 99 mg/dL   Comment 1 Notify RN    GLUCOSE, CAPILLARY     Status: Abnormal   Collection Time    11/29/13 11:40 AM      Result Value Ref Range   Glucose-Capillary 146 (*) 70 - 99 mg/dL   Comment 1 Notify RN    GLUCOSE, CAPILLARY     Status: Abnormal   Collection Time    11/29/13  4:44 PM      Result Value Ref Range   Glucose-Capillary 125 (*) 70 - 99 mg/dL  GLUCOSE, CAPILLARY     Status: Abnormal   Collection Time    11/29/13  8:59 PM      Result Value Ref Range   Glucose-Capillary 136 (*) 70 - 99 mg/dL  PROTIME-INR     Status: Abnormal   Collection Time    11/30/13  6:03 AM      Result Value Ref Range   Prothrombin Time 24.1 (*) 11.6 - 15.2 seconds   INR 2.24 (*) 0.00 - 1.49  GLUCOSE, CAPILLARY     Status: Abnormal   Collection Time    11/30/13  7:23 AM      Result Value Ref Range   Glucose-Capillary 68 (*) 70 -  99 mg/dL   Comment 1 Notify RN    GLUCOSE, CAPILLARY     Status: None   Collection Time    11/30/13  7:45 AM      Result Value Ref Range   Glucose-Capillary 75  70 - 99 mg/dL   Comment 1 Notify RN    GLUCOSE, CAPILLARY     Status: Abnormal   Collection Time    11/30/13 11:26 AM      Result Value Ref Range   Glucose-Capillary 57 (*) 70 - 99 mg/dL   Comment 1 Notify RN    GLUCOSE, CAPILLARY     Status: None   Collection Time    11/30/13 11:56 AM      Result Value Ref Range   Glucose-Capillary 83  70 - 99 mg/dL   Comment 1 Notify RN    GLUCOSE, CAPILLARY     Status: Abnormal   Collection Time    11/30/13  5:04 PM      Result Value Ref Range   Glucose-Capillary 204  (*) 70 - 99 mg/dL  GLUCOSE, CAPILLARY     Status: Abnormal   Collection Time    11/30/13  9:24 PM      Result Value Ref Range   Glucose-Capillary 135 (*) 70 - 99 mg/dL  PROTIME-INR     Status: Abnormal   Collection Time    12/01/13  5:07 AM      Result Value Ref Range   Prothrombin Time 28.0 (*) 11.6 - 15.2 seconds   INR 2.73 (*) 0.00 - 1.49  GLUCOSE, CAPILLARY     Status: None   Collection Time    12/01/13  7:17 AM      Result Value Ref Range   Glucose-Capillary 77  70 - 99 mg/dL   Comment 1 Notify RN    GLUCOSE, CAPILLARY     Status: None   Collection Time    12/01/13 12:09 PM      Result Value Ref Range   Glucose-Capillary 88  70 - 99 mg/dL   Comment 1 Notify RN    GLUCOSE, CAPILLARY     Status: Abnormal   Collection Time    12/01/13  4:15 PM      Result Value Ref Range   Glucose-Capillary 128 (*) 70 - 99 mg/dL  GLUCOSE, CAPILLARY     Status: Abnormal   Collection Time    12/01/13  8:45 PM      Result Value Ref Range   Glucose-Capillary 111 (*) 70 - 99 mg/dL  PROTIME-INR     Status: Abnormal   Collection Time    12/02/13  6:19 AM      Result Value Ref Range   Prothrombin Time 28.3 (*) 11.6 - 15.2 seconds   INR 2.77 (*) 0.00 - 1.49  GLUCOSE, CAPILLARY     Status: Abnormal   Collection Time    12/02/13  7:19 AM      Result Value Ref Range   Glucose-Capillary 100 (*) 70 - 99 mg/dL   Comment 1 Notify RN       HEENT: healing trach site with air leak  General: No acute distress Mood and affect are appropriate Heart: Regular rate and rhythm no rubs murmurs or extra sounds Lungs: Clear to auscultation, breathing unlabored, no rales or wheezes Abdomen: Positive bowel sounds, soft nontender to palpation, nondistended. Extremities: No clubbing, cyanosis, or edema, G tube site healed Skin: No evidence of breakdown, no evidence of rash Neurologic: Cranial  nerves II through XII intact, motor strength is 3/5 in bilateral deltoid, 4/5 bicep, tricep, grip, 3-/5 R hip and  Knee flexor,4- knee extensors,3- Left Hip flexor  ankle dorsiflexor and plantar flexor    Musculoskeletal: Tenderness mild right Achilles  Assessment/Plan: 1. Functional deficits secondary to Critical illness myopathy,  encephalopathy which require 3+ hours per day of interdisciplinary therapy in a comprehensive inpatient rehab setting. Physiatrist is providing close team supervision and 24 hour management of active medical problems listed below. Physiatrist and rehab team continue to assess barriers to discharge/monitor patient progress toward functional and medical goals.  FIM: FIM - Bathing Bathing Steps Patient Completed: Chest;Right Arm;Left Arm;Abdomen;Right upper leg;Left upper leg;Front perineal area;Buttocks Bathing: 4: Min-Patient completes 8-9 35f 10 parts or 75+ percent  FIM - Upper Body Dressing/Undressing Upper body dressing/undressing steps patient completed: Thread/unthread right sleeve of pullover shirt/dresss;Thread/unthread left sleeve of pullover shirt/dress;Put head through opening of pull over shirt/dress;Pull shirt over trunk Upper body dressing/undressing: 5: Supervision: Safety issues/verbal cues FIM - Lower Body Dressing/Undressing Lower body dressing/undressing steps patient completed: Thread/unthread right underwear leg;Thread/unthread left underwear leg;Thread/unthread right pants leg;Thread/unthread left pants leg;Pull pants up/down Lower body dressing/undressing: 3: Mod-Patient completed 50-74% of tasks  FIM - Toileting Toileting steps completed by patient: Adjust clothing after toileting;Adjust clothing prior to toileting;Performs perineal hygiene Toileting: 2: Max-Patient completed 1 of 3 steps  FIM - Toilet Transfers Toilet Transfers Assistive Devices: Art gallery manager Transfers: 3-From toilet/BSC: Mod A (lift or lower assist);3-To toilet/BSC: Mod A (lift or lower assist)  FIM - Banker Devices: Walker;Arm  rests Bed/Chair Transfer: 3: Bed > Chair or W/C: Mod A (lift or lower assist);3: Chair or W/C > Bed: Mod A (lift or lower assist)  FIM - Locomotion: Wheelchair Distance: 40 Locomotion: Wheelchair: 0: Activity did not occur FIM - Locomotion: Ambulation Locomotion: Ambulation Assistive Devices: Designer, industrial/product Ambulation/Gait Assistance: 4: Min assist Locomotion: Ambulation: 1: Travels less than 50 ft with minimal assistance (Pt.>75%)  Comprehension Comprehension Mode: Auditory Comprehension: 5-Follows basic conversation/direction: With no assist  Expression Expression Mode: Verbal Expression: 5-Expresses basic needs/ideas: With no assist  Social Interaction Social Interaction: 6-Interacts appropriately with others with medication or extra time (anti-anxiety, antidepressant).  Problem Solving Problem Solving: 5-Solves basic 90% of the time/requires cueing < 10% of the time  Memory Memory: 4-Recognizes or recalls 75 - 89% of the time/requires cueing 10 - 24% of the time   Medical Problem List and Plan:  1. 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 DVT Prophylaxis/Anticoagulation: Subcutaneous heparin discontinued and chronic Coumadin resumed 11/22/2013 without bridging. . Doppler showed possible Left popliteal DVT in a technically limited study, no progressive symptoms. Continue low dose SQ heparin until INR therapeutic 3. Pain Management: Oxycodone immediate release 5 mg every 6 hours as needed. Added kpad for right thigh 4. Mood: Clonazepam 0.5 mg 3 times a day. positive reinforcement  5. Neuropsych: This patient is capable of making decisions on her own behalf.  6. 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 occ cough will monitor start mucolytic 7. Dysphagia. Mechanical soft diet. Gastrostomy tube feeds on hold. Should be able to D/C PEG soon 8. Diabetes mellitus. Glipizide reduced to 5mg  on 5/19 ,titrate   Checking blood sugars a.c. and at bedtime. Sugars low in am. D/C levemir 5/18 9. Hypertension/atrial fibrillation.Uncontrolled Lasix 40 mg daily, Norvasc 10 mg daily,  Given HR in 80s increase Coreg 12.5 mg twice a day  10. Hypothyroidism. Tapazole 5  mg daily. Latest TSH level 2.230  11. Chronic anemia. Last hgb 8.7 monitor, check stool guaic 12. Obstructive sleep apnea. Resumed CPAP.--pt seems to be tolerating well  LOS (Days) 10 A FACE TO FACE EVALUATION WAS PERFORMED  Erick Colacendrew E Zamzam Whinery 12/02/2013, 8:33 AM

## 2013-12-02 NOTE — Progress Notes (Signed)
Occupational Therapy Session Note  Patient Details  Name: Enis Leatherwood MRN: [id number] Date of Birth: 10-04-1945  Today's Date: 12/02/2013 Time: 1345-1430 Time Calculation (min): 45 min  Short Term Goals: Week 1:  OT Short Term Goal 1 (Week 1): Pt will be able to stand at EOB with RW for 1 min with mod A to be able to pull pants over hips. OT Short Term Goal 1 - Progress (Week 1): Met OT Short Term Goal 2 (Week 1): Pt will don shirt with min A. OT Short Term Goal 2 - Progress (Week 1): Met OT Short Term Goal 3 (Week 1): Pt will demonstrate improved BUE shoulder AROM to lift her arms to be able to wash under them without assist. OT Short Term Goal 3 - Progress (Week 1): Met OT Short Term Goal 4 (Week 1): Pt will transfer to drop arm BSC with mod A x1.  OT Short Term Goal 4 - Progress (Week 1): Met Week 2:  OT Short Term Goal 1 (Week 2): Pt will be able to ambulate in and out of bathroom with RW with steady A.  OT Short Term Goal 2 (Week 2): Pt will be able to toilet with mod I.  OT Short Term Goal 3 (Week 2): Pt will be able to dress LB with set up except for shoes and socks. OT Short Term Goal 4 (Week 2): Pt will be able to transfer on and off a tub bench (simulated with clothing on) with min A.  Skilled Therapeutic Interventions/Progress Updates:    Pt seen this session to address UE AROM/ strength and functional mobility. Pt was in bed and stated she was exhausted. She said she felt much weaker in her R leg today. Pt was encouraged to work on functional mobility in the bathroom, but pt stated she was too tired.  She was agreeable to sitting to EOB to work on scooting on bed and UE ROM. Pt worked on scooting L/R with S. She only has 45 degrees of active sh flexion. Pt used UE ranger for multiple repetitions of sh flexion at various angles. Pt tolerated exercises well. She then worked on bed mobility and HOB raised to allow pt to use non wt dowel bar for a variety of arm exercises. Pt only  needed 2 short rest breaks.  Pt resting in bed with call light in reach.  Therapy Documentation Precautions:  Precautions Precautions: Fall Precaution Comments: Pt with colostomy, PEG, mid abdominal wound site, healing trach site with small air leak; previous R TKA Restrictions Weight Bearing Restrictions: No    Vital Signs: Therapy Vitals Temp: 98.5 F (36.9 C) Temp src: Oral Pulse Rate: 92 Resp: 18 BP: 190/106 mmHg Patient Position (if appropriate): Sitting Oxygen Therapy SpO2: 98 % O2 Device: None (Room air) Pain: Pain Assessment Pain Assessment: No/denies pain ADL: ADL ADL Comments: Refer to FIM  See FIM for current functional status  Therapy/Group: Individual Therapy  Harlene Ramus 12/02/2013, 2:42 PM

## 2013-12-02 NOTE — Consult Note (Signed)
WOC wound follow up Wound type: surgical wound Wound bed: more beefy red today, pink, moist Drainage (amount, consistency, odor) moderate, non foul, serosanguinous  Periwound: intact Dressing procedure/placement/frequency: Silver hydrofiber dressing cut to fit and packed into the midline wound, cover with dry dressing, secured with tape.   WOC ostomy follow up Stoma type/location: LLQ, end colosotmy Stomal assessment/size: 1 1/8" slightly oval shaped, slightly budded from the skin  Peristomal assessment: intact Treatment options for stomal/peristomal skin: added 2 " barrier ring used for periwound skin protection Output thick, brown, pasty  Ostomy pouching: 2pc. 2 1/4 Education provided: Pt cut new wafer, removed the old pouch, cleaned her skin, placed barrier ring, and then placed wafer (with assistance). Difficult for her to see stoma while in bed and due to the presence of feeding tube and her abdominal skin folds. But she did a great job with minimal assistance, she will have assistance from her family with this also. She had most difficulty with the attachment of the pouch to the wafer.  She will work on this with a spare pouch I have left in her room. She will most likely need to do this by using her sense of touch to feel the two pieces and how the join together. She is independent with lock and roll closure, and has been emptying her pouch per her report.    Enrolled patient in [facility] [employer] Discharge program: Yes  WOC will follow along with you for support with ostomy care and teaching  [facility] RN,CWOCN [phone]

## 2013-12-02 NOTE — Progress Notes (Signed)
ANTICOAGULATION CONSULT NOTE - Follow Up Consult  Pharmacy Consult for coumadin Indication: afib + hx DVT  Allergies  Allergen Reactions  . Codeine Other (See Comments)    Pass out    . Peanut Butter Flavor Hives and Other (See Comments)    Lockjaw   . Penicillins Swelling    Patient Measurements: Height: 5' 2.99" (160 cm) Weight: 231 lb 11.3 oz (105.1 kg) IBW/kg (Calculated) : 52.38 Heparin Dosing Weight:   Vital Signs: Temp: 98.1 F (36.7 C) (05/22 0641) Temp src: Oral (05/22 0641) BP: 154/91 mmHg (05/22 0641) Pulse Rate: 83 (05/22 0641)  Labs:  Recent Labs  11/30/13 0603 12/01/13 0507 12/02/13 0619  LABPROT 24.1* 28.0* 28.3*  INR 2.24* 2.73* 2.77*    Estimated Creatinine Clearance: 20.6 ml/min (by C-G formula based on Cr of 3.16).   Medications:  Scheduled:  . atorvastatin  10 mg Oral q1800  . carvedilol  12.5 mg Oral BID WC  . clonazePAM  0.5 mg Oral BID  . famotidine  20 mg Oral Daily  . furosemide  40 mg Oral Daily  . glipiZIDE  5 mg Oral QAC breakfast  . lactobacillus  1 g Oral TID WC  . methimazole  5 mg Oral Daily  . nitroGLYCERIN  0.2 mg Transdermal Daily  . potassium chloride  40 mEq Oral Daily  . predniSONE  15 mg Oral Q breakfast  . senna-docusate  1 tablet Oral BID  . sodium bicarbonate  1,300 mg Oral BID  . timolol  1 drop Both Eyes BID  . vitamin C  500 mg Oral BID  . Warfarin - Pharmacist Dosing Inpatient   Does not apply q1800  . zinc sulfate  220 mg Oral Daily   Infusions:    Assessment: 68 yo female with hx of afib and DVT is currently on therapeutic coumadin.  INR today is 2.77 Goal of Therapy:  INR 2-3 Monitor platelets by anticoagulation protocol: Yes   Plan:  1) Coumadin 7.5mg  po x1 today. May need 7.5mg  daily except 5 mg on Tue and Thursdays. 2) INR in am  Tsz-Yin Jessup Ogas 12/02/2013,8:24 AM

## 2013-12-02 NOTE — Progress Notes (Signed)
Physical Therapy Session Note  Patient Details  Name: Sarah Tran MRN: [id number] Date of Birth: [birth year]  Today's Date: 12/02/2013 Time: 8675-4492 Time Calculation (min): 43 min  Short Term Goals: Week 1:  PT Short Term Goal 1 (Week 1): Pt will roll L><R in flat hospital bed without rails, mod assist. PT Short Term Goal 1 - Progress (Week 1): Met PT Short Term Goal 2 (Week 1): Pt will transfer bed>< w/c with SB, mod assist for level surfaces, 50% of the time. PT Short Term Goal 2 - Progress (Week 1): Met PT Short Term Goal 3 (Week 1): Pt will propel w/c x 25' min assist. PT Short Term Goal 3 - Progress (Week 1): Met PT Short Term Goal 4 (Week 1): Pt will perform gait with LRAD +2 assist, x 10'. PT Short Term Goal 4 - Progress (Week 1): Met Week 2:  PT Short Term Goal 1 (Week 2): Pt will perform sit>supine with Min A PT Short Term Goal 2 (Week 2): Pt will perform bed<>chair transfers with min A  PT Short Term Goal 3 (Week 2): Pt will ambulate 84' with min A PT Short Term Goal 4 (Week 2): Pt will perform 3 stairs with Mod A  PT Short Term Goal 5 (Week 2): Pt will stand x86mn with Supervision  Skilled Therapeutic Interventions/Progress Updates:  Pt engaged in therex for strengthening, bed mobility and transfers as well as gait with RW and WC propulsion. Brother, [place] present and provided with ramp handout. He will measure furniture heights.   Performed therex for functional strengthening including bridging x10 and during dressing, as well as seated marching x20and unsupported sitting with ball batting with 1lb weighted bar x26m. Pt with most difficulty with hip flexion tasks.   Performed supine>sit with S and mod lifting A transfers overall with RW.  Gait training in controlled environment x45' with min steadying assist, limited by fatigue. Gait marked by decreased step length and forward flexed posture.   Instructed pt in WC propulsion x40' with S and cues for efficient stroke  length.  Hand-off to OT       Therapy Documentation Precautions:  Precautions Precautions: Fall Precaution Comments: Pt with colostomy, PEG, mid abdominal wound site, healing trach site with small air leak; previous R TKA Restrictions Weight Bearing Restrictions: No    Pain: None     See FIM for current functional status  Therapy/Group: Individual Therapy CoKennieth RadPT, DPT   12/02/2013, 10:44 AM

## 2013-12-03 ENCOUNTER — Inpatient Hospital Stay (HOSPITAL_COMMUNITY): Payer: Medicare Other | Admitting: Occupational Therapy

## 2013-12-03 DIAGNOSIS — R5381 Other malaise: Secondary | ICD-10-CM

## 2013-12-03 DIAGNOSIS — E119 Type 2 diabetes mellitus without complications: Secondary | ICD-10-CM

## 2013-12-03 LAB — GLUCOSE, CAPILLARY
GLUCOSE-CAPILLARY: 82 mg/dL (ref 70–99)
Glucose-Capillary: 91 mg/dL (ref 70–99)
Glucose-Capillary: 228 mg/dL — ABNORMAL HIGH (ref 70–99)
Glucose-Capillary: 142 mg/dL — ABNORMAL HIGH (ref 70–99)

## 2013-12-03 LAB — PROTIME-INR
INR: 3.21 — AB (ref 0.00–1.49)
Prothrombin Time: 31.7 seconds — ABNORMAL HIGH (ref 11.6–15.2)

## 2013-12-03 MED ORDER — ALBUTEROL SULFATE (2.5 MG/3ML) 0.083% IN NEBU
INHALATION_SOLUTION | RESPIRATORY_TRACT | Status: AC
  Administered 2013-12-03: 09:00:00
  Filled 2013-12-03: qty 3

## 2013-12-03 MED ORDER — ALBUTEROL SULFATE (2.5 MG/3ML) 0.083% IN NEBU
2.5000 mg | INHALATION_SOLUTION | Freq: Four times a day (QID) | RESPIRATORY_TRACT | Status: DC | PRN
  Administered 2013-12-04 – 2013-12-11 (×10): 2.5 mg via RESPIRATORY_TRACT
  Filled 2013-12-03 (×10): qty 3

## 2013-12-03 MED ORDER — WARFARIN SODIUM 5 MG PO TABS
5.0000 mg | ORAL_TABLET | Freq: Once | ORAL | Status: AC
  Administered 2013-12-03: 5 mg via ORAL
  Filled 2013-12-03: qty 1

## 2013-12-03 NOTE — Progress Notes (Signed)
ANTICOAGULATION CONSULT NOTE - Follow Up Consult  Pharmacy Consult for coumadin Indication: afib and hx of DVT  Allergies  Allergen Reactions  . Codeine Other (See Comments)    Pass out    . Peanut Butter Flavor Hives and Other (See Comments)    Lockjaw   . Penicillins Swelling    Patient Measurements: Height: 5' 2.99" (160 cm) Weight: 231 lb 11.3 oz (105.1 kg) IBW/kg (Calculated) : 52.38 Heparin Dosing Weight:   Vital Signs: Temp: 97.9 F (36.6 C) (05/23 0540) Temp src: Oral (05/23 0540) BP: 178/94 mmHg (05/23 0540) Pulse Rate: 80 (05/23 0540)  Labs:  Recent Labs  12/01/13 0507 12/02/13 0619 12/03/13 0445  LABPROT 28.0* 28.3* 31.7*  INR 2.73* 2.77* 3.21*    Estimated Creatinine Clearance: 20.6 ml/min (by C-G formula based on Cr of 3.16).   Medications:  Scheduled:  . atorvastatin  10 mg Oral q1800  . carvedilol  12.5 mg Oral BID WC  . clonazePAM  0.5 mg Oral BID  . famotidine  20 mg Oral Daily  . furosemide  40 mg Oral Daily  . glipiZIDE  5 mg Oral QAC breakfast  . lactobacillus  1 g Oral TID WC  . methimazole  5 mg Oral Daily  . nitroGLYCERIN  0.2 mg Transdermal Daily  . potassium chloride  40 mEq Oral Daily  . predniSONE  15 mg Oral Q breakfast  . senna-docusate  1 tablet Oral BID  . sodium bicarbonate  1,300 mg Oral BID  . timolol  1 drop Both Eyes BID  . vitamin C  500 mg Oral BID  . Warfarin - Pharmacist Dosing Inpatient   Does not apply q1800  . zinc sulfate  220 mg Oral Daily   Infusions:    Assessment: 68 yo female with afib and hx of DVT is currently on supratherapeutic coumadin.  INR up to 3.21.  Goal of Therapy:  INR 2-3 Monitor platelets by anticoagulation protocol: Yes   Plan:  1) Coumadin 5mg  po x1 2) INR in am  Tsz-Yin Heliodoro Domagalski 12/03/2013,9:29 AM

## 2013-12-03 NOTE — Progress Notes (Signed)
Occupational Therapy Session Note  Patient Details  Name: Sarah Tran MRN: [id number] Date of Birth: [birth year]  Today's Date: 12/03/2013 Time: 1130-1200 Time Calculation (min): 30 min  Short Term Goals: Week 1:  OT Short Term Goal 1 (Week 1): Pt will be able to stand at EOB with RW for 1 min with mod A to be able to pull pants over hips. OT Short Term Goal 1 - Progress (Week 1): Met OT Short Term Goal 2 (Week 1): Pt will don shirt with min A. OT Short Term Goal 2 - Progress (Week 1): Met OT Short Term Goal 3 (Week 1): Pt will demonstrate improved BUE shoulder AROM to lift her arms to be able to wash under them without assist. OT Short Term Goal 3 - Progress (Week 1): Met OT Short Term Goal 4 (Week 1): Pt will transfer to drop arm BSC with mod A x1.  OT Short Term Goal 4 - Progress (Week 1): Met Week 2:  OT Short Term Goal 1 (Week 2): Pt will be able to ambulate in and out of bathroom with RW with steady A.  OT Short Term Goal 2 (Week 2): Pt will be able to toilet with mod I.  OT Short Term Goal 3 (Week 2): Pt will be able to dress LB with set up except for shoes and socks. OT Short Term Goal 4 (Week 2): Pt will be able to transfer on and off a tub bench (simulated with clothing on) with min A.  Skilled Therapeutic Interventions/Progress Updates:    Pt seen this session to address functional mobility, activity tolerance, and tub bench transfers. Pt transferred Economy with S and ambulated from bed down hall way 100 feet with RW before needing a rest. Propelled to ADL apartment where she ambulated into BR with RW and practiced getting on and off the tub bench with mod A (A needed only to help with legs).  Discussed with pt and brother tub bench options and need for grab bars and HH Shower. Pt's brother took her back to the room.   Therapy Documentation Precautions:  Precautions Precautions: Fall Precaution Comments: Pt with colostomy, PEG, mid abdominal wound site, healing trach site with  small air leak; previous R TKA Restrictions Weight Bearing Restrictions: No   Pain: Pain Assessment Pain Assessment: 0-10 Pain Score: 4  Pain Type: Chronic pain Pain Location: Knee Pain Orientation: Right Pain Descriptors / Indicators: Aching Pain Intervention(s): Medication (See eMAR) ADL: ADL ADL Comments: Refer to FIM  See FIM for current functional status  Therapy/Group: Individual Therapy  Geoffery Lyons Shahan Starks 12/03/2013, 1:18 PM

## 2013-12-03 NOTE — Progress Notes (Signed)
Patient ID: Sydnee CabalNancy Salomone, female   DOB: 1946-02-02, 68 y.o.   MRN: [id number]  12/03/13. Subjective/Complaints:  68 year old right-handed morbidly obese African American female. Past medical history of hypertension, renal insufficiency, obstructive sleep apnea with CPAP, DVT/A. fib with chronic Coumadin. Patient [facility] 09/28/2013 for removal of an ileal polyp. Noted perioperative complications balloon catheter became dislodged. She required exploratory laparotomy and diverting colostomy as well as appendectomy secondary to perforated sigmoid colon.   Her hospital was complicated by respiratory failure requiring intubation and eventually tracheostomy as well his gastrostomy tube for nutritional support. She remained in intensive care for 28 days. Patient developed sepsis;  cultures growing gram-positive cocci in clusters maintained on broad-spectrum antibiotics. [facility] further vent weaning and wound care. An echocardiogram is completed 10/28/2013 showing mild LVH with ejection fraction of 55-60%. 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   Hospital course complicated by acute renal insufficiency with creatinine 2.51, A. fib with RVR, question angioedema from Invanz reaction and wound dehiscence.   Past Medical History  Diagnosis Date  . PAF (paroxysmal atrial fibrillation)   . Anemia of chronic disease   . HTN (hypertension)   . Morbid obesity   . Diabetes mellitus, type II   . Hyperthyroidism   . Hyperlipidemia   . CKD (chronic kidney disease), stage III   . DVT (deep venous thrombosis)     No past surgical history on file.  Review of Systems - otherwise negative;  complaining of a mild cough this AM Objective: Vital Signs: Blood pressure 178/94, pulse 80, temperature  97.9 F (36.6 C), temperature source Oral, resp. rate 20, height 5' 2.99" (1.6 m), weight 231 lb 11.3 oz (105.1 kg), SpO2 100.00%. No results found. Results for orders placed during the hospital encounter of 11/22/13 (from the past 72 hour(s))  GLUCOSE, CAPILLARY     Status: Abnormal   Collection Time    11/30/13 11:26 AM      Result Value Ref Range   Glucose-Capillary 57 (*) 70 - 99 mg/dL   Comment 1 Notify RN    GLUCOSE, CAPILLARY     Status: None   Collection Time    11/30/13 11:56 AM      Result Value Ref Range   Glucose-Capillary 83  70 - 99 mg/dL   Comment 1 Notify RN    GLUCOSE, CAPILLARY     Status: Abnormal   Collection Time    11/30/13  5:04 PM      Result Value Ref Range   Glucose-Capillary 204 (*) 70 - 99 mg/dL  GLUCOSE, CAPILLARY     Status: Abnormal   Collection Time    11/30/13  9:24 PM      Result Value Ref Range   Glucose-Capillary 135 (*) 70 - 99 mg/dL  PROTIME-INR     Status: Abnormal   Collection Time    12/01/13  5:07 AM      Result Value Ref Range   Prothrombin Time 28.0 (*) 11.6 - 15.2 seconds   INR 2.73 (*) 0.00 - 1.49  GLUCOSE, CAPILLARY     Status: None   Collection Time    12/01/13  7:17 AM      Result Value Ref Range   Glucose-Capillary 77  70 - 99 mg/dL   Comment 1 Notify RN    GLUCOSE,  CAPILLARY     Status: None   Collection Time    12/01/13 12:09 PM      Result Value Ref Range   Glucose-Capillary 88  70 - 99 mg/dL   Comment 1 Notify RN    GLUCOSE, CAPILLARY     Status: Abnormal   Collection Time    12/01/13  4:15 PM      Result Value Ref Range   Glucose-Capillary 128 (*) 70 - 99 mg/dL  GLUCOSE, CAPILLARY     Status: Abnormal   Collection Time    12/01/13  8:45 PM      Result Value Ref Range   Glucose-Capillary 111 (*) 70 - 99 mg/dL  PROTIME-INR     Status: Abnormal   Collection Time    12/02/13  6:19 AM      Result Value Ref Range   Prothrombin Time 28.3 (*) 11.6 - 15.2 seconds   INR 2.77 (*) 0.00 - 1.49  GLUCOSE, CAPILLARY      Status: Abnormal   Collection Time    12/02/13  7:19 AM      Result Value Ref Range   Glucose-Capillary 100 (*) 70 - 99 mg/dL   Comment 1 Notify RN    GLUCOSE, CAPILLARY     Status: None   Collection Time    12/02/13 11:15 AM      Result Value Ref Range   Glucose-Capillary 74  70 - 99 mg/dL   Comment 1 Notify RN    GLUCOSE, CAPILLARY     Status: Abnormal   Collection Time    12/02/13  4:43 PM      Result Value Ref Range   Glucose-Capillary 155 (*) 70 - 99 mg/dL  GLUCOSE, CAPILLARY     Status: Abnormal   Collection Time    12/02/13  9:20 PM      Result Value Ref Range   Glucose-Capillary 106 (*) 70 - 99 mg/dL  PROTIME-INR     Status: Abnormal   Collection Time    12/03/13  4:45 AM      Result Value Ref Range   Prothrombin Time 31.7 (*) 11.6 - 15.2 seconds   INR 3.21 (*) 0.00 - 1.49  GLUCOSE, CAPILLARY     Status: None   Collection Time    12/03/13  7:13 AM      Result Value Ref Range   Glucose-Capillary 82  70 - 99 mg/dL    Patient Vitals for the past 24 hrs:  BP Temp Temp src Pulse Resp SpO2  12/03/13 0911 - - - - - 100 %  12/03/13 0540 178/94 mmHg 97.9 F (36.6 C) Oral 80 20 99 %  12/02/13 2315 - 97.1 F (36.2 C) Oral - - -  12/02/13 2200 175/89 mmHg 100 F (37.8 C) Oral 90 18 97 %  12/02/13 1355 190/106 mmHg 98.5 F (36.9 C) Oral 92 18 98 %     Intake/Output Summary (Last 24 hours) at 12/03/13 1011 Last data filed at 12/03/13 0900  Gross per 24 hour  Intake    480 ml  Output      0 ml  Net    480 ml       General: No acute distress HEENT-  healing trach site  Heart: Regular rate and rhythm no rubs murmurs or extra sounds Lungs: Clear to auscultation, breathing unlabored, no rales or wheezes; O2 saturation normal Abdomen: Positive bowel sounds, soft nontender to palpation, nondistended. Extremities: No clubbing, cyanosis, or edema,  G tube site healed Skin: No evidence of breakdown, no evidence of rash Neurologic: Cranial nerves II through XII intact,  motor strength is 3/5 in bilateral deltoid, 4/5 bicep, tricep, grip, 3-/5 R hip and Knee flexor,4- knee extensors,3- Left Hip flexor  ankle dorsiflexor and plantar flexor     Assessment/Plan: 1. Functional deficits secondary to Critical illness myopathy,  encephalopathy which require 3+ hours per day of interdisciplinary therapy in a comprehensive inpatient rehab setting. 2. DVT Prophylaxis/Anticoagulation: Subcutaneous heparin discontinued and chronic Coumadin resumed 11/22/2013 without bridging. . Doppler showed possible Left popliteal DVT in a technically limited study, no progressive symptoms. 3. 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 occ cough will monitor start mucolytic 4. Dysphagia. Mechanical soft diet. Gastrostomy tube feeds on hold. Should be able to D/C PEG soon 5. Diabetes mellitus. Glipizide reduced to 5mg  on 5/19 ,titrate  Checking blood sugars a.c. and at bedtime. Sugars low in am. D/C levemir 5/18.  We'll continue to observe closely for hypoglycemia 6. Hypertension/atrial fibrillation.Uncontrolled Lasix 40 mg daily, Norvasc 10 mg daily,  Given HR in 80s increase Coreg 12.5 mg twice a day  7. Chronic anemia. Last hgb 8.7 monitor, check stool guaic 8. Obstructive sleep apnea. Resumed CPAP.--pt seems to be tolerating well  LOS (Days) 11 A FACE TO FACE EVALUATION WAS PERFORMED  Gordy Savers 12/03/2013, 10:07 AM

## 2013-12-03 NOTE — Plan of Care (Signed)
Problem: RH PAIN MANAGEMENT Goal: RH STG PAIN MANAGED AT OR BELOW PT'S PAIN GOAL Pain level 3 or less  Outcome: Progressing No complaints of pain this shift

## 2013-12-04 ENCOUNTER — Inpatient Hospital Stay (HOSPITAL_COMMUNITY): Payer: Medicare Other | Admitting: *Deleted

## 2013-12-04 LAB — GLUCOSE, CAPILLARY
GLUCOSE-CAPILLARY: 70 mg/dL (ref 70–99)
Glucose-Capillary: 67 mg/dL — ABNORMAL LOW (ref 70–99)
Glucose-Capillary: 133 mg/dL — ABNORMAL HIGH (ref 70–99)
Glucose-Capillary: 241 mg/dL — ABNORMAL HIGH (ref 70–99)

## 2013-12-04 LAB — PROTIME-INR
INR: 4.15 — AB (ref 0.00–1.49)
Prothrombin Time: 38.5 seconds — ABNORMAL HIGH (ref 11.6–15.2)

## 2013-12-04 MED ORDER — DM-GUAIFENESIN ER 30-600 MG PO TB12: 1.0000 | ORAL_TABLET | Freq: Two times a day (BID) | ORAL | Status: DC

## 2013-12-04 MED ORDER — DM-GUAIFENESIN ER 30-600 MG PO TB12
1.0000 | ORAL_TABLET | Freq: Two times a day (BID) | ORAL | Status: DC | PRN
  Administered 2013-12-04 – 2013-12-11 (×7): 1 via ORAL
  Filled 2013-12-04 (×7): qty 1

## 2013-12-04 NOTE — Progress Notes (Signed)
Patient ID: Sarah CabalNancy Costin, female   DOB: 19-Feb-1946, 68 y.o.   MRN: [id number]  Patient ID: Sarah Cabalancy Courington, female   DOB: 19-Feb-1946, 68 y.o.   MRN: [id number]  12/04/13. Subjective/Complaints:  68 year old right-handed morbidly obese African American female. Past medical history of hypertension, renal insufficiency, obstructive sleep apnea with CPAP, DVT/A. fib with chronic Coumadin. [facility] for removal of an ileal polyp. Noted perioperative complications balloon catheter became dislodged. She required exploratory laparotomy and diverting colostomy as well as appendectomy secondary to perforated sigmoid colon.   Her hospital was complicated by respiratory failure requiring intubation and eventually tracheostomy as well his gastrostomy tube for nutritional support. She remained in intensive care for 28 days. Patient developed sepsis;  cultures growing gram-positive cocci in clusters maintained on broad-spectrum antibiotics. [facility] stabilized [facility] for further vent weaning and wound care. An echocardiogram is completed 10/28/2013 showing mild LVH with ejection fraction of 55-60%. 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   Hospital course complicated by acute renal insufficiency with creatinine 2.51, A. fib with RVR, question angioedema from Invanz reaction and wound dehiscence.  Patient states cough is much improved this morning and has been helped by nebulizer treatments.   Past Medical History  Diagnosis Date  . PAF (paroxysmal atrial fibrillation)   . Anemia of chronic disease   . HTN (hypertension)   . Morbid obesity   . Diabetes mellitus, type II   . Hyperthyroidism   . Hyperlipidemia   . CKD (chronic kidney disease), stage III   . DVT (deep venous thrombosis)     No past  surgical history on file.  Review of Systems - otherwise negative;  complaining of a mild cough this AM Objective: Vital Signs: Blood pressure 183/97, pulse 85, temperature 98.2 F (36.8 C), temperature source Oral, resp. rate 20, height 5' 2.99" (1.6 m), weight 231 lb 11.3 oz (105.1 kg), SpO2 98.00%. No results found. Results for orders placed during the hospital encounter of 11/22/13 (from the past 72 hour(s))  GLUCOSE, CAPILLARY     Status: None   Collection Time    12/01/13 12:09 PM      Result Value Ref Range   Glucose-Capillary 88  70 - 99 mg/dL   Comment 1 Notify RN    GLUCOSE, CAPILLARY     Status: Abnormal   Collection Time    12/01/13  4:15 PM      Result Value Ref Range   Glucose-Capillary 128 (*) 70 - 99 mg/dL  GLUCOSE, CAPILLARY     Status: Abnormal   Collection Time    12/01/13  8:45 PM      Result Value Ref Range   Glucose-Capillary 111 (*) 70 - 99 mg/dL  PROTIME-INR     Status: Abnormal   Collection Time    12/02/13  6:19 AM      Result Value Ref Range   Prothrombin Time 28.3 (*) 11.6 - 15.2 seconds   INR 2.77 (*) 0.00 - 1.49  GLUCOSE, CAPILLARY     Status: Abnormal   Collection Time    12/02/13  7:19 AM      Result Value Ref Range   Glucose-Capillary 100 (*) 70 - 99 mg/dL   Comment 1 Notify RN    GLUCOSE, CAPILLARY     Status: None   Collection Time  12/02/13 11:15 AM      Result Value Ref Range   Glucose-Capillary 74  70 - 99 mg/dL   Comment 1 Notify RN    GLUCOSE, CAPILLARY     Status: Abnormal   Collection Time    12/02/13  4:43 PM      Result Value Ref Range   Glucose-Capillary 155 (*) 70 - 99 mg/dL  GLUCOSE, CAPILLARY     Status: Abnormal   Collection Time    12/02/13  9:20 PM      Result Value Ref Range   Glucose-Capillary 106 (*) 70 - 99 mg/dL  PROTIME-INR     Status: Abnormal   Collection Time    12/03/13  4:45 AM      Result Value Ref Range   Prothrombin Time 31.7 (*) 11.6 - 15.2 seconds   INR 3.21 (*) 0.00 - 1.49  GLUCOSE,  CAPILLARY     Status: None   Collection Time    12/03/13  7:13 AM      Result Value Ref Range   Glucose-Capillary 82  70 - 99 mg/dL  GLUCOSE, CAPILLARY     Status: None   Collection Time    12/03/13 12:08 PM      Result Value Ref Range   Glucose-Capillary 91  70 - 99 mg/dL   Comment 1 Notify RN    GLUCOSE, CAPILLARY     Status: Abnormal   Collection Time    12/03/13  4:43 PM      Result Value Ref Range   Glucose-Capillary 228 (*) 70 - 99 mg/dL   Comment 1 Notify RN    GLUCOSE, CAPILLARY     Status: Abnormal   Collection Time    12/03/13  8:38 PM      Result Value Ref Range   Glucose-Capillary 142 (*) 70 - 99 mg/dL  PROTIME-INR     Status: Abnormal   Collection Time    12/04/13  5:45 AM      Result Value Ref Range   Prothrombin Time 38.5 (*) 11.6 - 15.2 seconds   INR 4.15 (*) 0.00 - 1.49  GLUCOSE, CAPILLARY     Status: Abnormal   Collection Time    12/04/13  7:55 AM      Result Value Ref Range   Glucose-Capillary 67 (*) 70 - 99 mg/dL   Comment 1 Notify RN      Patient Vitals for the past 24 hrs:  BP Temp Temp src Pulse Resp SpO2  12/04/13 0650 183/97 mmHg 98.2 F (36.8 C) Oral 85 20 98 %  12/04/13 0519 - - - - - 96 %  12/03/13 2145 176/97 mmHg 97.9 F (36.6 C) Oral 96 18 95 %  12/03/13 1449 172/83 mmHg 98.2 F (36.8 C) Oral 86 18 100 %  12/03/13 0911 - - - - - 100 %     Intake/Output Summary (Last 24 hours) at 12/04/13 0820 Last data filed at 12/03/13 1319  Gross per 24 hour  Intake     80 ml  Output      0 ml  Net     80 ml    Lab Results  Component Value Date   INR 4.15* 12/04/2013   INR 3.21* 12/03/2013   INR 2.77* 12/02/2013     General: No acute distress HEENT-  healing trach site  Heart: Regular rate and rhythm no rubs murmurs or extra sounds Lungs: Clear to auscultation, breathing unlabored, no rales or wheezes; O2 saturation  normal Abdomen: Positive bowel sounds, soft nontender to palpation, nondistended. Extremities: No clubbing, cyanosis, or  edema, G tube site healed Skin: No evidence of breakdown, no evidence of rash Neurologic: Cranial nerves II through XII intact, motor strength is 3/5 in bilateral deltoid, 4/5 bicep, tricep, grip, 3-/5 R hip and Knee flexor,4- knee extensors,3- Left Hip flexor  ankle dorsiflexor and plantar flexor     Assessment/Plan: 1. Functional deficits secondary to Critical illness myopathy,  encephalopathy which require 3+ hours per day of interdisciplinary therapy in a comprehensive inpatient rehab setting. 2. DVT Prophylaxis/Anticoagulation: Subcutaneous heparin discontinued and chronic Coumadin resumed 11/22/2013 without bridging. . Doppler showed possible Left popliteal DVT in a technically limited study, no progressive symptoms.  INR slightly supra therapeutic today.  Pharmacy to adjust dosing and  3. 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 occ cough will monitor start mucolytic 4. Dysphagia. Mechanical soft diet. Gastrostomy tube feeds on hold. Should be able to D/C PEG soon 5. Diabetes mellitus. Glipizide reduced to 5mg  on 5/19 ,titrate  Checking blood sugars a.c. and at bedtime. Sugars low in am. D/C levemir 5/18.  We'll continue to observe closely for hypoglycemia 6. Hypertension/atrial fibrillation.Uncontrolled Lasix 40 mg daily, Norvasc 10 mg daily,  Given HR in 80s increase Coreg 12.5 mg twice a day  7. Chronic anemia. Last hgb 8.7 monitor, check stool guaic 8. Obstructive sleep apnea. Resumed CPAP.--pt seems to be tolerating well  LOS (Days) 12 A FACE TO FACE EVALUATION WAS PERFORMED  Gordy Savers 12/04/2013, 8:20 AM

## 2013-12-04 NOTE — Progress Notes (Signed)
ANTICOAGULATION CONSULT NOTE - Follow Up Consult  Pharmacy Consult for coumadin Indication: afib and hx of DVT  Allergies  Allergen Reactions  . Codeine Other (See Comments)    Pass out    . Peanut Butter Flavor Hives and Other (See Comments)    Lockjaw   . Penicillins Swelling    Patient Measurements: Height: 5' 2.99" (160 cm) Weight: 231 lb 11.3 oz (105.1 kg) IBW/kg (Calculated) : 52.38 Heparin Dosing Weight:   Vital Signs: Temp: 98.2 F (36.8 C) (05/24 0650) Temp src: Oral (05/24 0650) BP: 183/97 mmHg (05/24 0650) Pulse Rate: 85 (05/24 0650)  Labs:  Recent Labs  12/02/13 0619 12/03/13 0445 12/04/13 0545  LABPROT 28.3* 31.7* 38.5*  INR 2.77* 3.21* 4.15*    Estimated Creatinine Clearance: 20.6 ml/min (by C-G formula based on Cr of 3.16).   Medications:  Scheduled:  . atorvastatin  10 mg Oral q1800  . carvedilol  12.5 mg Oral BID WC  . clonazePAM  0.5 mg Oral BID  . famotidine  20 mg Oral Daily  . furosemide  40 mg Oral Daily  . glipiZIDE  5 mg Oral QAC breakfast  . lactobacillus  1 g Oral TID WC  . methimazole  5 mg Oral Daily  . nitroGLYCERIN  0.2 mg Transdermal Daily  . potassium chloride  40 mEq Oral Daily  . predniSONE  15 mg Oral Q breakfast  . senna-docusate  1 tablet Oral BID  . sodium bicarbonate  1,300 mg Oral BID  . timolol  1 drop Both Eyes BID  . vitamin C  500 mg Oral BID  . Warfarin - Pharmacist Dosing Inpatient   Does not apply q1800  . zinc sulfate  220 mg Oral Daily   Infusions:    Assessment: 68 yo female with afib and hx of DVT is currently on supratherapeutic coumadin.  INR up to 4.15 today Goal of Therapy:  INR 2-3 Monitor platelets by anticoagulation protocol: Yes   Plan:  1) Hold coumadin today 2) INR in am  Bayard Hugger, PharmD, BCPS  Clinical Pharmacist  Pager: [phone]   12/04/2013,9:49 AM

## 2013-12-04 NOTE — Progress Notes (Signed)
A-%t 520 patient with c/o difficulty breathing, RT notified and breathing treatment administered. Pt reports feeling much better. Productive cough of small thick brown mucous, Sats 96-99%. Continue to monitor. Phill Mutter Elric Tirado

## 2013-12-04 NOTE — Progress Notes (Signed)
Physical Therapy Session Note  Patient Details  Name: Sarah Tran MRN: [id number] Date of Birth: Aug 16, 1945  Today's Date: 12/04/2013 Time: 1130-1200 Time Calculation (min): 30 min  Skilled Therapeutic Interventions/Progress Updates:  Patient sitting in w/c at the beginning of the session. Focus on today's session was on transfers, gait and w/c propulsion. Gait training with RW on a distance of 2x40 feet with CGA and cues to maintain proper posture and for continuous steps. Multiple transfers sit to stand with and w/o use of UE.mod A for standing w/o UE push and Min for standing when pushing with B UE.  W/C propulsion on a distance of 75 feet x2 with Supervision. Patient needs increased rest breaks due to decreased cardiopulmonary capacity. Patient returned to room, daughter present, all needs within reach.  No c/o pain during this session.  Therapy Documentation Precautions:  Precautions Precautions: Fall Precaution Comments: Pt with colostomy, PEG, mid abdominal wound site, healing trach site with small air leak; previous R TKA Restrictions Weight Bearing Restrictions: No Vital Signs: Therapy Vitals Temp: 97.5 F (36.4 C) Temp src: Oral Pulse Rate: 88 Resp: 20 BP: 176/85 mmHg Patient Position (if appropriate): Sitting Oxygen Therapy SpO2: 100 % O2 Device: None (Room air) See FIM for current functional status  Therapy/Group: Individual Therapy  Dorna Mai 12/04/2013, 3:51 PM

## 2013-12-05 ENCOUNTER — Inpatient Hospital Stay (HOSPITAL_COMMUNITY): Payer: Medicare Other | Admitting: Speech Pathology

## 2013-12-05 ENCOUNTER — Inpatient Hospital Stay (HOSPITAL_COMMUNITY): Payer: Medicare Other | Admitting: *Deleted

## 2013-12-05 ENCOUNTER — Inpatient Hospital Stay (HOSPITAL_COMMUNITY): Payer: Medicare Other | Admitting: Occupational Therapy

## 2013-12-05 LAB — GLUCOSE, CAPILLARY
GLUCOSE-CAPILLARY: 125 mg/dL — AB (ref 70–99)
Glucose-Capillary: 75 mg/dL (ref 70–99)
Glucose-Capillary: 56 mg/dL — ABNORMAL LOW (ref 70–99)
Glucose-Capillary: 79 mg/dL (ref 70–99)
Glucose-Capillary: 128 mg/dL — ABNORMAL HIGH (ref 70–99)

## 2013-12-05 LAB — PROTIME-INR
INR: 4.08 — ABNORMAL HIGH (ref 0.00–1.49)
Prothrombin Time: 38 seconds — ABNORMAL HIGH (ref 11.6–15.2)

## 2013-12-05 MED ORDER — GLUCOSE 40 % PO GEL
ORAL | Status: AC
  Administered 2013-12-05: 37.5 g
  Filled 2013-12-05: qty 1

## 2013-12-05 MED ORDER — GLUCOSE 40 % PO GEL: 1.0000 | Freq: Once | ORAL | Status: AC

## 2013-12-05 NOTE — Plan of Care (Signed)
Problem: RH BOWEL ELIMINATION Goal: RH STG MANAGE BOWEL W/MEDICATION W/ASSISTANCE STG Manage Bowel with Medication with modified independent Assistance.  Outcome: Not Progressing Total assist instructed patient to attempt more hands on care to ostomy Goal: RH STG MANAGE BOWEL W/EQUIPMENT W/ASSISTANCE STG Manage Bowel With Equipment With max Assistance from caregiver  Outcome: Not Progressing Caregiver not in for education   Problem: RH SKIN INTEGRITY Goal: RH STG MAINTAIN SKIN INTEGRITY WITH ASSISTANCE STG Maintain Skin Integrity With moderate Assistance.  Outcome: Not Progressing Max assist

## 2013-12-05 NOTE — Progress Notes (Signed)
Hypoglycemic Event  CBG: 56 at 1129  Treatment: 1 tube instant glucose  Symptoms: None  Follow-up CBG: Time:1222 CBG Result:79  Possible Reasons for Event: Inadequate meal intake  Comments/MD notified:Dan Anguilli PA notified. Orders to give glucose gel     Cleotilde Neer  Remember to initiate Hypoglycemia Order Set & complete

## 2013-12-05 NOTE — Progress Notes (Signed)
ANTICOAGULATION CONSULT NOTE - Follow Up Consult  Pharmacy Consult for Coumadin Indication: atrial fibrillation and Hx DVT  Allergies  Allergen Reactions  . Codeine Other (See Comments)    Pass out    . Peanut Butter Flavor Hives and Other (See Comments)    Lockjaw   . Penicillins Swelling    Patient Measurements: Height: 5' 2.99" (160 cm) Weight: 231 lb 11.3 oz (105.1 kg) IBW/kg (Calculated) : 52.38 Heparin Dosing Weight:   Vital Signs: Temp: 98.7 F (37.1 C) (05/25 0553) Temp src: Oral (05/25 0553) BP: 148/80 mmHg (05/25 0903) Pulse Rate: 88 (05/25 0943)  Labs:  Recent Labs  12/03/13 0445 12/04/13 0545 12/05/13 0532  LABPROT 31.7* 38.5* 38.0*  INR 3.21* 4.15* 4.08*    Estimated Creatinine Clearance: 20.6 ml/min (by C-G formula based on Cr of 3.16).   Medications:  Scheduled:  . atorvastatin  10 mg Oral q1800  . carvedilol  12.5 mg Oral BID WC  . clonazePAM  0.5 mg Oral BID  . famotidine  20 mg Oral Daily  . furosemide  40 mg Oral Daily  . lactobacillus  1 g Oral TID WC  . methimazole  5 mg Oral Daily  . nitroGLYCERIN  0.2 mg Transdermal Daily  . potassium chloride  40 mEq Oral Daily  . predniSONE  15 mg Oral Q breakfast  . senna-docusate  1 tablet Oral BID  . sodium bicarbonate  1,300 mg Oral BID  . timolol  1 drop Both Eyes BID  . vitamin C  500 mg Oral BID  . Warfarin - Pharmacist Dosing Inpatient   Does not apply q1800  . zinc sulfate  220 mg Oral Daily    Assessment: 68yo female with elevated INR which appears to have crested and down to 4.08 this AM.  No CBC this AM, no bleeding noted.  Paged Jesusita Oka, [place] re: pt on daily K and last BMP was 5/13.  Pt is also not receiving Amlodipine with BP running high; he plans to order tomorrow as pt NPO for PEG.  Goal of Therapy:  INR 2-3 Monitor platelets by anticoagulation protocol: Yes   Plan:  1-  No Coumadin today 2-  F/U in AM 3-  BMP with AM labs   Marisue Humble, PharmD Clinical  Pharmacist [facility]

## 2013-12-05 NOTE — Progress Notes (Signed)
Speech Language Pathology Daily Session Note  Patient Details  Name: Sarah Tran MRN: [id number] Date of Birth: [birth year]  Today's Date: 12/05/2013 Time: 9191-6606 Time Calculation (min): 42 min  Short Term Goals: Week 2: SLP Short Term Goal 1 (Week 2): Pt will utilize safe swallowing strategies with recommended diet with modified independence SLP Short Term Goal 2 (Week 2): Pt will demonstrate basic problem solving during familiar, functional tasks with min cues SLP Short Term Goal 3 (Week 2): Pt will verbalize at least at least 1 physical and 2 cognitive impairments with min  cues SLP Short Term Goal 4 (Week 2): Pt will utilize exteral memory aids to facilitate recall of new and daily information with Min cues SLP Short Term Goal 5 (Week 2): Pt will improve alternating/divided attention to functional task for 10 minutes with Min cues  Skilled Therapeutic Interventions: Pt was seen for skilled speech therapy targeting executive function and attention during a semi-complex card game.  Pt benefitted from mod assist faded to supervision cues for organization and error awareness while also alternating her attention between the SLP and the card game with supervision cues.  Pt exhibits continued improvements for memory and was able to recall at least 3 details from previous therapy sessions as well as daily events and information related to her medical care.  Continue per current plan of care.    FIM:  Comprehension Comprehension Mode: Auditory Comprehension: 5-Understands complex 90% of the time/Cues < 10% of the time Expression Expression Mode: Verbal Expression: 5-Expresses basic needs/ideas: With no assist Social Interaction Social Interaction: 6-Interacts appropriately with others with medication or extra time (anti-anxiety, antidepressant). Problem Solving Problem Solving: 5-Solves basic 90% of the time/requires cueing < 10% of the time Memory Memory: 4-Recognizes or recalls 75 - 89%  of the time/requires cueing 10 - 24% of the time FIM - Eating Eating Activity: 6: More than reasonable amount of time  Pain Pain Assessment Pain Assessment: No/denies pain  Therapy/Group: Individual Therapy  Jackalyn Lombard, M.A. CCC-SLP  Sarah Tran 12/05/2013, 4:40 PM

## 2013-12-05 NOTE — Progress Notes (Signed)
Subjective/Complaints: 68 year old right-handed morbidly obese African American female. Past medical history of hypertension, renal insufficiency, obstructive sleep apnea with CPAP, DVT/A. fib with chronic Coumadin. Hospital course patient admitted to [facility] 09/28/2013 for removal of a ileal polyp. Noted perioperative complications balloon catheter became dislodged. She required exploratory laparotomy and diverting colostomy as well as appendectomy secondary to perforated sigmoid colon. Her hospital was complicated by respiratory failure requiring intubation and eventually tracheostomy as well his gastrostomy tube for nutritional support. She remained in intensive care for 28 days. Patient developed sepsis cultures growing gram-positive cocci in clusters maintained on broad-spectrum antibiotics. [facility] stabilized and [facility] for further vent weaning and wound care. An echocardiogram is completed 10/28/2013 showing mild LVH with ejection fraction of 55-60%. 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   Amb with PT, poor endurance but no SOB Delayed responses  Review of Systems - otherwise negative Objective: Vital Signs: Blood pressure 148/80, pulse 98, temperature 98.7 F (37.1 C), temperature source Oral, resp. rate 18, height 5' 2.99" (1.6 m), weight 105.1 kg (231 lb 11.3 oz), SpO2 97.00%. No results found. Results for orders placed during the hospital encounter of 11/22/13 (from the past 72 hour(s))  GLUCOSE, CAPILLARY     Status: None   Collection Time    12/02/13 11:15 AM      Result Value Ref Range   Glucose-Capillary 74  70 - 99 mg/dL   Comment 1 Notify RN    GLUCOSE, CAPILLARY     Status: Abnormal   Collection Time    12/02/13  4:43 PM      Result Value Ref Range   Glucose-Capillary 155 (*) 70 - 99  mg/dL  GLUCOSE, CAPILLARY     Status: Abnormal   Collection Time    12/02/13  9:20 PM      Result Value Ref Range   Glucose-Capillary 106 (*) 70 - 99 mg/dL  PROTIME-INR     Status: Abnormal   Collection Time    12/03/13  4:45 AM      Result Value Ref Range   Prothrombin Time 31.7 (*) 11.6 - 15.2 seconds   INR 3.21 (*) 0.00 - 1.49  GLUCOSE, CAPILLARY     Status: None   Collection Time    12/03/13  7:13 AM      Result Value Ref Range   Glucose-Capillary 82  70 - 99 mg/dL  GLUCOSE, CAPILLARY     Status: None   Collection Time    12/03/13 12:08 PM      Result Value Ref Range   Glucose-Capillary 91  70 - 99 mg/dL   Comment 1 Notify RN    GLUCOSE, CAPILLARY     Status: Abnormal   Collection Time    12/03/13  4:43 PM      Result Value Ref Range   Glucose-Capillary 228 (*) 70 - 99 mg/dL   Comment 1 Notify RN    GLUCOSE, CAPILLARY     Status: Abnormal   Collection Time    12/03/13  8:38 PM      Result Value Ref Range   Glucose-Capillary 142 (*) 70 - 99 mg/dL  PROTIME-INR     Status: Abnormal   Collection Time    12/04/13  5:45 AM      Result Value Ref Range   Prothrombin Time 38.5 (*) 11.6 - 15.2  seconds   INR 4.15 (*) 0.00 - 1.49  GLUCOSE, CAPILLARY     Status: Abnormal   Collection Time    12/04/13  7:55 AM      Result Value Ref Range   Glucose-Capillary 67 (*) 70 - 99 mg/dL   Comment 1 Notify RN    GLUCOSE, CAPILLARY     Status: None   Collection Time    12/04/13 11:19 AM      Result Value Ref Range   Glucose-Capillary 70  70 - 99 mg/dL   Comment 1 Notify RN    GLUCOSE, CAPILLARY     Status: Abnormal   Collection Time    12/04/13  5:34 PM      Result Value Ref Range   Glucose-Capillary 133 (*) 70 - 99 mg/dL  GLUCOSE, CAPILLARY     Status: Abnormal   Collection Time    12/04/13  9:45 PM      Result Value Ref Range   Glucose-Capillary 241 (*) 70 - 99 mg/dL   Comment 1 Notify RN    PROTIME-INR     Status: Abnormal   Collection Time    12/05/13  5:32 AM       Result Value Ref Range   Prothrombin Time 38.0 (*) 11.6 - 15.2 seconds   INR 4.08 (*) 0.00 - 1.49  GLUCOSE, CAPILLARY     Status: None   Collection Time    12/05/13  7:21 AM      Result Value Ref Range   Glucose-Capillary 75  70 - 99 mg/dL     HEENT: healing trach site with air leak  General: No acute distress Mood and affect are appropriate Heart: Regular rate and rhythm no rubs murmurs or extra sounds Lungs: Clear to auscultation, breathing unlabored, no rales or wheezes Abdomen: Positive bowel sounds, soft nontender to palpation, nondistended. Extremities: No clubbing, cyanosis, or edema, G tube site healed Skin: No evidence of breakdown, no evidence of rash Neurologic: Cranial nerves II through XII intact, motor strength is 3/5 in bilateral deltoid, 4/5 bicep, tricep, grip, 3-/5 R hip and Knee flexor,4- knee extensors,3- Left Hip flexor  ankle dorsiflexor and plantar flexor    Musculoskeletal: Tenderness mild right Achilles  Assessment/Plan: 1. Functional deficits secondary to Critical illness myopathy,  encephalopathy which require 3+ hours per day of interdisciplinary therapy in a comprehensive inpatient rehab setting. Physiatrist is providing close team supervision and 24 hour management of active medical problems listed below. Physiatrist and rehab team continue to assess barriers to discharge/monitor patient progress toward functional and medical goals.  FIM: FIM - Bathing Bathing Steps Patient Completed: Chest;Right Arm;Left Arm;Abdomen;Right upper leg;Left upper leg;Front perineal area;Buttocks Bathing: 4: Min-Patient completes 8-9 81f 10 parts or 75+ percent  FIM - Upper Body Dressing/Undressing Upper body dressing/undressing steps patient completed: Thread/unthread right sleeve of pullover shirt/dresss;Thread/unthread left sleeve of pullover shirt/dress;Put head through opening of pull over shirt/dress;Pull shirt over trunk Upper body dressing/undressing: 5: Supervision:  Safety issues/verbal cues FIM - Lower Body Dressing/Undressing Lower body dressing/undressing steps patient completed: Thread/unthread right underwear leg;Thread/unthread left underwear leg;Thread/unthread right pants leg;Thread/unthread left pants leg;Pull pants up/down Lower body dressing/undressing: 3: Mod-Patient completed 50-74% of tasks  FIM - Toileting Toileting steps completed by patient: Adjust clothing after toileting;Adjust clothing prior to toileting;Performs perineal hygiene Toileting Assistive Devices: Grab bar or rail for support Toileting: 4: Steadying assist  FIM - Diplomatic Services operational officer Devices: Art gallery manager Transfers: 3-To toilet/BSC: Mod A (lift or lower assist);4-From  toilet/BSC: Min A (steadying Pt. > 75%)  FIM - Bed/Chair Transfer Bed/Chair Transfer Assistive Devices: Therapist, occupationalWalker Bed/Chair Transfer: 4: Bed > Chair or W/C: Min A (steadying Pt. > 75%)  FIM - Locomotion: Wheelchair Distance: 40 Locomotion: Wheelchair: 2: Travels 50 - 149 ft with supervision, cueing or coaxing FIM - Locomotion: Ambulation Locomotion: Ambulation Assistive Devices: Designer, industrial/productWalker - Rolling Ambulation/Gait Assistance: 4: Min assist Locomotion: Ambulation: 1: Travels less than 50 ft with minimal assistance (Pt.>75%)  Comprehension Comprehension Mode: Auditory Comprehension: 6-Follows complex conversation/direction: With extra time/assistive device  Expression Expression Mode: Verbal Expression: 5-Expresses complex 90% of the time/cues < 10% of the time  Social Interaction Social Interaction: 6-Interacts appropriately with others with medication or extra time (anti-anxiety, antidepressant).  Problem Solving Problem Solving: 5-Solves complex 90% of the time/cues < 10% of the time  Memory Memory: 4-Recognizes or recalls 75 - 89% of the time/requires cueing 10 - 24% of the time   Medical Problem List and Plan:  1. Functional deficits secondary to critical illness  myopathy related to respiratory failure multi-medical as above  2. DVT Prophylaxis/Anticoagulation: Subcutaneous heparin discontinued and chronic Coumadin resumed 11/22/2013 without bridging. . Doppler showed possible Left popliteal DVT in a technically limited study, no progressive symptoms.  INR supratherapeutic 3. Pain Management: Oxycodone immediate release 5 mg every 6 hours as needed. Added kpad for right thigh 4. Mood: Clonazepam 0.5 mg 3 times a day. positive reinforcement  5. Neuropsych: This patient is capable of making decisions on her own behalf.  6. 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 occ cough will monitor start mucolytic 7. Dysphagia. Mechanical soft diet. Gastrostomy tube feeds on hold since admission Should be able to D/C PEG  8. Diabetes mellitus. Glipizide will d/c  Checking blood sugars a.c. and at bedtime. Sugars low in am. D/C levemir 5/18 9. Hypertension/atrial fibrillation.Uncontrolled Lasix 40 mg daily, Norvasc 10 mg daily,  Given HR in 80s increase Coreg 12.5 mg twice a day  10. Hypothyroidism. Tapazole 5 mg daily. Latest TSH level 2.230  11. Chronic anemia. Last hgb 8.7 monitor, check stool guaic 12. Obstructive sleep apnea. Resumed CPAP.--pt seems to be tolerating well  LOS (Days) 13 A FACE TO FACE EVALUATION WAS PERFORMED  Erick Colacendrew E Abraham Entwistle 12/05/2013, 9:42 AM

## 2013-12-05 NOTE — Progress Notes (Signed)
Occupational Therapy Session Note  Patient Details  Name: Sarah Tran MRN: [id number] Date of Birth: June 30, 1946  Today's Date: 12/05/2013 Time: 0737-1062 Time Calculation (min): 60 min  Short Term Goals: Week 1:  OT Short Term Goal 1 (Week 1): Pt will be able to stand at EOB with RW for 1 min with mod A to be able to pull pants over hips. OT Short Term Goal 1 - Progress (Week 1): Met OT Short Term Goal 2 (Week 1): Pt will don shirt with min A. OT Short Term Goal 2 - Progress (Week 1): Met OT Short Term Goal 3 (Week 1): Pt will demonstrate improved BUE shoulder AROM to lift her arms to be able to wash under them without assist. OT Short Term Goal 3 - Progress (Week 1): Met OT Short Term Goal 4 (Week 1): Pt will transfer to drop arm BSC with mod A x1.  OT Short Term Goal 4 - Progress (Week 1): Met Week 2:  OT Short Term Goal 1 (Week 2): Pt will be able to ambulate in and out of bathroom with RW with steady A.  OT Short Term Goal 2 (Week 2): Pt will be able to toilet with mod I.  OT Short Term Goal 3 (Week 2): Pt will be able to dress LB with set up except for shoes and socks. OT Short Term Goal 4 (Week 2): Pt will be able to transfer on and off a tub bench (simulated with clothing on) with min A.  Skilled Therapeutic Interventions/Progress Updates:      Pt seen for BADL retraining of bathing and dressing with a focus on activity tolerance and functional mobility. Pt was weaker due to her blood sugar being lower than normal from her NPO status. Nursing provided pt with glucose.  Pt was able to complete her self care well but needed extra time with many rest breaks. Pt is doing well with reacher to don clothing over hips and her standing balance is also improving. Pt had time at end of session to do a few arm exercises to increase shoulder ROM using UE ranger. Pt was adjusted back to bed as she was fatigued. Overall, pt did participate well.  Pt's brother arrived at end of session.    Therapy  Documentation Precautions:  Precautions Precautions: Fall Precaution Comments: Pt with colostomy, PEG, mid abdominal wound site, healing trach site with small air leak; previous R TKA Restrictions Weight Bearing Restrictions: No    Vital Signs: Therapy Vitals Pulse Rate: 88 BP: 148/80 mmHg Pain: No c/o pain   ADL: ADL ADL Comments: Refer to FIM    See FIM for current functional status  Therapy/Group: Individual Therapy  Harlene Ramus 12/05/2013, 12:22 PM

## 2013-12-05 NOTE — Progress Notes (Signed)
Physical Therapy Session Note  Patient Details  Name: Sarah Tran MRN: [id number] Date of Birth: 06-Oct-1945  Today's Date: 12/05/2013 Time: 0905-1000 and 13:35-14:05 (63mn)  Time Calculation (min): 55 min  Short Term Goals: Week 1:  PT Short Term Goal 1 (Week 1): Pt will roll L><R in flat hospital bed without rails, mod assist. PT Short Term Goal 1 - Progress (Week 1): Met PT Short Term Goal 2 (Week 1): Pt will transfer bed>< w/c with SB, mod assist for level surfaces, 50% of the time. PT Short Term Goal 2 - Progress (Week 1): Met PT Short Term Goal 3 (Week 1): Pt will propel w/c x 25' min assist. PT Short Term Goal 3 - Progress (Week 1): Met PT Short Term Goal 4 (Week 1): Pt will perform gait with LRAD +2 assist, x 10'. PT Short Term Goal 4 - Progress (Week 1): Met Week 2:  PT Short Term Goal 1 (Week 2): Pt will perform sit>supine with Min A PT Short Term Goal 2 (Week 2): Pt will perform bed<>chair transfers with min A  PT Short Term Goal 3 (Week 2): Pt will ambulate 542 with min A PT Short Term Goal 4 (Week 2): Pt will perform 3 stairs with Mod A  PT Short Term Goal 5 (Week 2): Pt will stand x562m with Supervision  Skilled Therapeutic Interventions/Progress Updates:  First tx focused on functional mobility, gait with RW, and sitting/standing therex for strengthening.  [place], so reapplied for increased circulation and decreased edema.  Provided pt with incentive spirometer and instructions on use for increase lung capacity and decreased SOB with exertion. Pt given schedule to perform throughout the day.   Pt needed min lifting assist throughout tx to RW and // bars, using momentum.   Performed gait training in controlled setting x125' with 2 standing rests, increased HR and decreased gait speed.   Instructed pt in unsupported seated core strengthening with red weight ball, reaching laterally and anteriorly for increased trunk control x20. Also performed  standing therex at // bars for strengthening and standing tolerance: heel raises, marching, and hip ABD x10 each side.     [facility]   Second tx focused on transfers, attempting stairs, and gait with RW.  Pt resting in bed with family present. Performed supine>sit with S and transfer with Min A and cues for safe hand placement.   Attempted stairs, but pt unable to lift self, also limited by anxiety. Transitioned to step-taps with bil rails x5 each side with increased effort L>R. HR increased to 103bpm following step-taps, but returned to 84 in 30m88m   Performed gait training with [id number] close S and S cues for posture and continuous gait cycle. Pt limited by fatigue.  [facility].   Therapy Documentation Precautions:  Precautions Precautions: Fall Precaution Comments: Pt with colostomy, PEG, mid abdominal wound site, healing trach site with small air leak; previous R TKA Restrictions Weight Bearing Restrictions: No    Vital Signs: Therapy Vitals Pulse Rate: 88 BP: 148/80 mmHg Pain: None    Locomotion : Ambulation Ambulation/Gait Assistance: 5: Supervision   See FIM for current functional status  Therapy/Group: Individual Therapy ColKennieth RadT, DPT  12/05/2013, 10:04 AM

## 2013-12-06 ENCOUNTER — Inpatient Hospital Stay (HOSPITAL_COMMUNITY): Payer: Medicare Other | Admitting: Occupational Therapy

## 2013-12-06 ENCOUNTER — Inpatient Hospital Stay (HOSPITAL_COMMUNITY): Payer: Medicare Other | Admitting: Speech Pathology

## 2013-12-06 ENCOUNTER — Inpatient Hospital Stay (HOSPITAL_COMMUNITY): Payer: Medicare Other | Admitting: *Deleted

## 2013-12-06 DIAGNOSIS — J9620 Acute and chronic respiratory failure, unspecified whether with hypoxia or hypercapnia: Secondary | ICD-10-CM

## 2013-12-06 DIAGNOSIS — R5381 Other malaise: Secondary | ICD-10-CM

## 2013-12-06 DIAGNOSIS — E119 Type 2 diabetes mellitus without complications: Secondary | ICD-10-CM

## 2013-12-06 DIAGNOSIS — N289 Disorder of kidney and ureter, unspecified: Secondary | ICD-10-CM

## 2013-12-06 LAB — GLUCOSE, CAPILLARY
GLUCOSE-CAPILLARY: 163 mg/dL — AB (ref 70–99)
Glucose-Capillary: 71 mg/dL (ref 70–99)
Glucose-Capillary: 95 mg/dL (ref 70–99)
Glucose-Capillary: 233 mg/dL — ABNORMAL HIGH (ref 70–99)

## 2013-12-06 LAB — BASIC METABOLIC PANEL
BUN: 46 mg/dL — ABNORMAL HIGH (ref 6–23)
CO2: 22 mEq/L (ref 19–32)
CREATININE: 2.29 mg/dL — AB (ref 0.50–1.10)
Calcium: 8.9 mg/dL (ref 8.4–10.5)
Chloride: 104 mEq/L (ref 96–112)
GFR calc non Af Amer: 21 mL/min — ABNORMAL LOW (ref >90)
GFR, EST AFRICAN AMERICAN: 25 mL/min — AB (ref >90)
Glucose, Bld: 76 mg/dL (ref 70–99)
Potassium: 3.8 mEq/L (ref 3.7–5.3)
Sodium: 140 mEq/L (ref 137–147)

## 2013-12-06 LAB — PROTIME-INR
INR: 4.26 — ABNORMAL HIGH (ref 0.00–1.49)
PROTHROMBIN TIME: 39.3 s — AB (ref 11.6–15.2)

## 2013-12-06 LAB — OCCULT BLOOD X 1 CARD TO LAB, STOOL: Fecal Occult Bld: POSITIVE — AB

## 2013-12-06 NOTE — Progress Notes (Signed)
Speech Language Pathology Daily Session Note  Patient Details  Name: Sarah Tran MRN: [id number] Date of Birth: [birth year]  Today's Date: 12/06/2013 Time: 1005-1045 Time Calculation (min): 40 min  Short Term Goals: Week 2: SLP Short Term Goal 1 (Week 2): Pt will utilize safe swallowing strategies with recommended diet with modified independence SLP Short Term Goal 2 (Week 2): Pt will demonstrate basic problem solving during familiar, functional tasks with min cues SLP Short Term Goal 3 (Week 2): Pt will verbalize at least at least 1 physical and 2 cognitive impairments with min  cues SLP Short Term Goal 4 (Week 2): Pt will utilize exteral memory aids to facilitate recall of new and daily information with Min cues SLP Short Term Goal 5 (Week 2): Pt will improve alternating/divided attention to functional task for 10 minutes with Min cues  Skilled Therapeutic Interventions: Pt was seen for skilled speech therapy targeting memory and functional problem solving.  Pt was received from OT therapy session with brother present.  Pt recalled  compensatory strategies for memory, improving to 4/4 with mod assist.  Additionally, pt was 71% independent for recall of functional appointments, improving to 86% accuracy with min assist.  Pt was independent for working memory over 9/15 targeted trials, improving to 12/15 with min assist, and 14/15 with mod assist.  Pt also benefitted from min-mod cuing for deductive reasoning during structured logic puzzles secondary to working memory and organization impairments.     FIM:  Comprehension Comprehension Mode: Auditory Comprehension: 5-Understands complex 90% of the time/Cues < 10% of the time Expression Expression Mode: Verbal Expression: 5-Expresses basic needs/ideas: With no assist Social Interaction Social Interaction: 6-Interacts appropriately with others with medication or extra time (anti-anxiety, antidepressant). Problem Solving Problem Solving:  5-Solves basic 90% of the time/requires cueing < 10% of the time Memory Memory: 4-Recognizes or recalls 75 - 89% of the time/requires cueing 10 - 24% of the time  Pain Pain Assessment Pain Assessment: No/denies pain  Therapy/Group: Individual Therapy  Jackalyn Lombard, M.A. CCC-SLP  Melanee Spry Jameil Whitmoyer 12/06/2013, 1:06 PM

## 2013-12-06 NOTE — Consult Note (Signed)
WOC planned visit today to monitor patient for pouch change however per nursing notes they changed pouch for the patient yesterday.  I have outlined the pouch change procedure in the ostomy care nursing orders.  Avyn Aden Ericson RN,CWOCN [phone]

## 2013-12-06 NOTE — Progress Notes (Signed)
Physical Therapy Weekly Progress Note  Patient Details  Name: Sarah Tran MRN: [id number] Date of Birth: 02-17-1946  Beginning of progress report period: Nov 29, 2013 End of progress report period: Dec 06, 2013  Today's Date: 12/06/2013 Time: 11:00-12:00 (68mn) and 14:20-14:50 (331m)   Patient has met 3 of 5 short term goals.  Pt continues to make excellent progress with strength, balance, and cardiorespiratory endurance. Pt went from walking 10' to >100' with RW and min A, but is still challenged with sit>stand transfers and bed mobility due to LE weakness. Pt is easily SOB, but is improving luncg capacity with breathing exercises and incentive spirometer. She has great family support that is currently making home modifications for safety and accessibility.   Patient continues to demonstrate the following deficits: bil LE weakness, core weakness, cardiorespiratory endurance, and therefore will continue to benefit from skilled PT intervention to enhance overall performance with activity tolerance and balance.  Patient progressing toward long term goals..  Plan of care revisions: Increase gait goal distance, downgrade bed mobility and car transfer goals due to LE weakness.  PT Short Term Goals Week 2:  PT Short Term Goal 1 (Week 2): Pt will perform sit>supine with Min A PT Short Term Goal 1 - Progress (Week 2): Progressing toward goal PT Short Term Goal 2 (Week 2): Pt will perform bed<>chair transfers with min A  PT Short Term Goal 2 - Progress (Week 2): Met PT Short Term Goal 3 (Week 2): Pt will ambulate 5037with min A PT Short Term Goal 3 - Progress (Week 2): Met PT Short Term Goal 4 (Week 2): Pt will perform 3 stairs with Mod A  PT Short Term Goal 4 - Progress (Week 2): Not met PT Short Term Goal 5 (Week 2): Pt will stand x5m65mwith Supervision PT Short Term Goal 5 - Progress (Week 2): Met Week 3:  PT Short Term Goal 1 (Week 3): STG=LTG due to DC next week S for basic transfers,  balance, and gait x150' with RW, Min A bar and bed mobility.   Skilled Therapeutic Interventions/Progress Updates:  Tx focused on breathing ex, unsupported seated therex, gait with RW on incline, and car transfer with brother.  [facility], with min A for lifting.   Staff pushed pt to skyway for gait on graded surface. Gait training x75' with min A and safety cues to increase BOS.   Instructed pt/amily in car transfer, needing Mod A for LE lifting, using RW for support.   Instructed pt in seated ball kicking and hitting with 1# weight x5mi47mverall for strengthening and activity tolerance.  Reinforced incentive spirometer and deep breathing ex in room with S cues.     Second tx focused on family training with room mobility, bed mobility, and edema management.  Pt was receiving lymphedema wrapping at [facility], and current wrapping method is not sufficient for edema management. Pt is not comfortable in XL ted hose, and ACE wraps are bunching too much. Educated pt on positioning in bed and removed ACE wraps. Will continue to explore options for wraps.   Family reports brother has been assisting pt to bathroom despite policies. Educated pt/family and instructed in safe room mobility to assess safety. He was able to return-demo safe technique after cues for safety and closer positioning. Updated safety plan.   Practiced various methods of bed mobility to increase independence. Pt able to use 4" stool and leg lifters with significantly increased time,  effort, and cues for sequence. But she did not need physical assist.    Therapy Documentation Precautions:  Precautions Precautions: Fall Precaution Comments: Pt with colostomy, PEG, mid abdominal wound site, healing trach site with small air leak; previous R TKA Restrictions Weight Bearing Restrictions: No General:   Vital Signs: Therapy Vitals Pulse Rate: 88 BP: 128/70 mmHg Pain: Pain Assessment Pain Assessment:  No/denies pain  See FIM for current functional status  Therapy/Group: Individual Therapy  Kennieth Rad, PT, DPT  12/06/2013, 11:55 AM

## 2013-12-06 NOTE — Progress Notes (Signed)
ANTICOAGULATION CONSULT NOTE - Follow Up Consult  Pharmacy Consult for coumadin Indication: afib + hx of DVT  Allergies  Allergen Reactions  . Codeine Other (See Comments)    Pass out    . Peanut Butter Flavor Hives and Other (See Comments)    Lockjaw   . Penicillins Swelling    Patient Measurements: Height: 5' 2.99" (160 cm) Weight: 231 lb 11.3 oz (105.1 kg) IBW/kg (Calculated) : 52.38 Heparin Dosing Weight:   Vital Signs: Temp: 98.1 F (36.7 C) (05/26 0506) Temp src: Oral (05/26 0506) BP: 153/74 mmHg (05/26 0506) Pulse Rate: 82 (05/26 0506)  Labs:  Recent Labs  12/04/13 0545 12/05/13 0532 12/06/13 0624  LABPROT 38.5* 38.0* 39.3*  INR 4.15* 4.08* 4.26*  CREATININE  --   --  2.29*    Estimated Creatinine Clearance: 28.4 ml/min (by C-G formula based on Cr of 2.29).   Medications:  Scheduled:  . atorvastatin  10 mg Oral q1800  . carvedilol  12.5 mg Oral BID WC  . clonazePAM  0.5 mg Oral BID  . famotidine  20 mg Oral Daily  . furosemide  40 mg Oral Daily  . lactobacillus  1 g Oral TID WC  . methimazole  5 mg Oral Daily  . nitroGLYCERIN  0.2 mg Transdermal Daily  . potassium chloride  40 mEq Oral Daily  . predniSONE  15 mg Oral Q breakfast  . senna-docusate  1 tablet Oral BID  . sodium bicarbonate  1,300 mg Oral BID  . timolol  1 drop Both Eyes BID  . vitamin C  500 mg Oral BID  . Warfarin - Pharmacist Dosing Inpatient   Does not apply q1800  . zinc sulfate  220 mg Oral Daily   Infusions:    Assessment: 68 yo female with afib + hx of DVT is currently on supratherapeutic coumadin. INR today is 4.26 (last coumadin dose was 12/03/13) Goal of Therapy:  INR 2-3 Monitor platelets by anticoagulation protocol: Yes   Plan:  1) No coumadin tonight 2) INR in am  Tsz-Yin Daphyne Miguez 12/06/2013,8:22 AM

## 2013-12-06 NOTE — Progress Notes (Signed)
Occupational Therapy Session Note  Patient Details  Name: Sarah Tran MRN: [id number] Date of Birth: 03-27-1946  Today's Date: 12/06/2013 Time: 0900-1000 Time Calculation (min): 60 min  Short Term Goals: Week 1:  OT Short Term Goal 1 (Week 1): Pt will be able to stand at EOB with RW for 1 min with mod A to be able to pull pants over hips. OT Short Term Goal 1 - Progress (Week 1): Met OT Short Term Goal 2 (Week 1): Pt will don shirt with min A. OT Short Term Goal 2 - Progress (Week 1): Met OT Short Term Goal 3 (Week 1): Pt will demonstrate improved BUE shoulder AROM to lift her arms to be able to wash under them without assist. OT Short Term Goal 3 - Progress (Week 1): Met OT Short Term Goal 4 (Week 1): Pt will transfer to drop arm BSC with mod A x1.  OT Short Term Goal 4 - Progress (Week 1): Met Week 2:  OT Short Term Goal 1 (Week 2): Pt will be able to ambulate in and out of bathroom with RW with steady A.  OT Short Term Goal 2 (Week 2): Pt will be able to toilet with mod I.  OT Short Term Goal 3 (Week 2): Pt will be able to dress LB with set up except for shoes and socks. OT Short Term Goal 4 (Week 2): Pt will be able to transfer on and off a tub bench (simulated with clothing on) with min A.  Skilled Therapeutic Interventions/Progress Updates:    Pt scheduled for B/D this am, but pt had already completed the tasks earlier from EOB with her brother.  Pt stood at sink for several minutes to complete brushing her teeth.  Pt then rested in chair prior to ambulating down hallway for 10 minutes. Pt was then transported via w/c the rest of the way to the gym.  Pt worked on A/Arom of R shoulder along with scapular strengthening exercises with light theraband. Pt practiced a room exercise exercise program with the theraband with her brother observing. Pt encouraged to work on these exercises in her room later this afternoon.  Pt taken to her speech therapist session.  Therapy  Documentation Precautions:  Precautions Precautions: Fall Precaution Comments: Pt with colostomy, PEG, mid abdominal wound site, healing trach site with small air leak; previous R TKA Restrictions Weight Bearing Restrictions: No   Vital Signs: Therapy Vitals Pulse Rate: 88 BP: 128/70 mmHg Pain: Pain Assessment Pain Assessment: No/denies pain ADL: ADL ADL Comments: Refer to FIM  See FIM for current functional status  Therapy/Group: Individual Therapy  Harlene Ramus 12/06/2013, 10:54 AM

## 2013-12-07 ENCOUNTER — Inpatient Hospital Stay (HOSPITAL_COMMUNITY): Payer: Medicare Other

## 2013-12-07 ENCOUNTER — Inpatient Hospital Stay (HOSPITAL_COMMUNITY): Payer: Medicare Other | Admitting: Speech Pathology

## 2013-12-07 ENCOUNTER — Inpatient Hospital Stay (HOSPITAL_COMMUNITY): Payer: Medicare Other | Admitting: Occupational Therapy

## 2013-12-07 LAB — GLUCOSE, CAPILLARY
GLUCOSE-CAPILLARY: 87 mg/dL (ref 70–99)
GLUCOSE-CAPILLARY: 112 mg/dL — AB (ref 70–99)
Glucose-Capillary: 193 mg/dL — ABNORMAL HIGH (ref 70–99)
Glucose-Capillary: 130 mg/dL — ABNORMAL HIGH (ref 70–99)

## 2013-12-07 LAB — PROTIME-INR
INR: 3.92 — ABNORMAL HIGH (ref 0.00–1.49)
Prothrombin Time: 36.9 seconds — ABNORMAL HIGH (ref 11.6–15.2)

## 2013-12-07 NOTE — Consult Note (Signed)
WOC ostomy follow up  Met with patient and her family today.  Her daughter will be at home with her and will assist with her care. She also has two granddaughters that are CNA's Stoma type/location: LLQ, end colostomy Stomal assessment/size: a little larger than 1" around and very flush with the skin  Peristomal assessment: intact Treatment options for stomal/peristomal skin: added 2" barrier ring for flush stoma  Output pasty, brown Ostomy pouching: 2 pc. Used with 2" barrier ring, however now evaluation of patient sitting in her wheelchair reveals the large skin fold above the stoma and the curvature of the abdominal skin fold. Will continue to try the but may need to switch her to flexible 1pc if the 2pc becomes an issue due to lack of flexibilty with her abdomen Education provided:  Daughter here to learn ostomy care today. Mrs. Lundblad seems very SOB today and is requesting that her daughter perform most of the care.  I have pushed the patient to assist with some of the care. Daughter with guidance from her mother (the patient) was able to successfully change the pouch. Further instructions today on burping the gas from the pouch and cleaning the drainage area of the pouch with toliet paper and disposal. I have send precut pouches to the home for their use through the discharge program. Will continue to follow through rehab stay.   Enrolled patient in [facility] Discharge program: Yes  [place], [place]

## 2013-12-07 NOTE — Patient Care Conference (Signed)
Inpatient RehabilitationTeam Conference and Plan of Care Update Date: 12/07/2013   Time: 11:00 AM    Patient Name: Sarah Tran      Medical Record Number: [id number]  Date of Birth: [birth year] Sex: Female         Room/Bed: 4W02C/4W02C-01 Payor Info: Payor: MEDICARE / Plan: MEDICARE PART A AND B / Product Type: *No Product type* /    Admitting Diagnosis: Deconditioned   Admit Date/Time:  11/22/2013  4:33 PM Admission Comments: No comment available   Primary Diagnosis:  <principal problem not specified> Principal Problem: <principal problem not specified>  Patient Active Problem List   Diagnosis Date Noted  . Physical deconditioning 11/22/2013  . Acute on chronic respiratory failure 10/26/2013  . 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 Toxic metabolic encephalopathy 10/26/2013  . Hypervolemia 10/26/2013  . Acute on chronic renal insufficiency 10/26/2013  . DM2 (diabetes mellitus, type 2) 10/26/2013  . 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  Maintain medical stability increase strength  Manage anxiety   Bowel/Bladder   Pt has a colostomy bag and uses the [facility]  Pt will become more familiar with colostomy and care for it  Continue to educate pt and have her take a more hands on approach to her colostomy care   Swallow/Nutrition/ Hydration   regular solids, thin liquids, intermittent supervision   supervision with least  restrictive diet  diet tolerance, discharge supervision recommendations   ADL's   min bathing, mod LB dressing, min A toilet transfers, mod A tub bench transfers  min A overall  ADL retraining, functional mobility training, pt/family education   Mobility   Bed mobility Mod A, Min/mod  A RW transfers, Min A gait xup tp 100,' cognition continues to improve, attempted stairs but unable,  Supervision basic transfers, Min A bed mobility and furniture/car transfers, S gait x150' in controlled setting  What can we do for lymphedema, XXL wraps?; therex, bed mobility with leg lifter, family training and ed on home modification and healthy lifestyle, gait in varying conditions   Communication   n/a  n/a  n/a   Safety/Cognition/ Behavioral Observations  Min-mod assist for memory, alternating/divided attention, problem solving   min assist-supervision   carryover of compensatory strategies    Pain   Pt reports no pain  Pt to remain without pain and or a pain level of 3 or less on a scale of 0 to 10  Assess for pain Qshift and as needed    Skin   Pt has multiple skin issues  Maintain skin integrity and prevent any further breakdown  Assess skin Qshift and as needed      *See Care Plan and progress notes for long and short-term goals.  Barriers to Discharge: Morbid obesity    Possible Resolutions to Barriers:  Continue rehabilitation, family training    Discharge Planning/Teaching Needs:  HOme with family providing 24 hr care-need to do family training with ones assisting with colostomy      Team Discussion:  Making good  progress, family here to go through therapies and learn colostomy care.  Reaching goals sooner  Revisions to Treatment Plan:  Moving up discharge date to Monday 6/1   Continued Need for Acute Rehabilitation Level of Care: The patient requires daily medical management by a physician with specialized training in physical medicine and rehabilitation for the following  conditions: Daily direction of a multidisciplinary physical rehabilitation program to ensure safe treatment while eliciting the highest outcome that is of practical value to the patient.: Yes Daily medical management of patient stability for increased activity during participation in an intensive rehabilitation regime.: Yes Daily analysis of laboratory values and/or radiology reports with any subsequent need for medication adjustment of medical intervention for : Neurological problems;Post surgical problems;Pulmonary problems  Sarah Tran 12/07/2013, 1:00 PM

## 2013-12-07 NOTE — Progress Notes (Signed)
Social Work Patient ID: Sarah Tran, female   DOB: 01-01-1946, 68 y.o.   MRN: [id number] Met with pt, daughter and brother who are here to inform of team conference progression toward her goals and being able to move up discharge date to 6/1, since reaching her  Goals sooner.  All very pleased with this and pt will be ready to go home.  Daughter is here to learn her colostomy care and to attend therapies with her.  [place] care For follow up.  Will work on DME needs and prepare for discharge Monday.

## 2013-12-07 NOTE — Progress Notes (Signed)
Physical Therapy Session Note  Patient Details  Name: Sarah Tran MRN: [id number] Date of Birth: January 22, 1946  Today's Date: 12/07/2013 Time: 0905-1000, 1300-1345 Time Calculation (min): 55 min, 45 min  Short Term Goals: Week 3:  PT Short Term Goal 1 (Week 3): STG=LTG due to DC next week S for basic transfers, balance, and gait x150' with RW, Min A bar and bed mobility.   Skilled Therapeutic Interventions/Progress Updates:  Tx 1:  Pt c/o breathing difficulties, bad nerves due to colostomy bag coming off during the night and nurses requesting her to re-apply it. MD aware. O2 sats WNLs.  Pt reported she needed to use toilet.  Sit> stand from w/c with supervision, without cues.  Gait in room with RW, min guard assist. Pt able to balance for 2 handed clothing mgt at toilet, except for 1 mild LOB backwards, recovered independently.  Gait in hallway x 120' with min guard assist.  Distance limited by leg weakness. Cues for upright trunk, forward gaze, R hip abduction to widen BOS. Sitting activity tapping beach ball with 2# bar, focusing on trunk control and shoulder flexion.   neuromuscular re-education via hand out, visual feedback, demo for R and L ankle circles 20 x 1 each in sitting, calf raises R and L foot ahead 10 x 1 each in standing  Daughter Jasmine December stated pt's walker at home does not have wheels.  Family owns a w/c, and she will measure width.  Tx 2:  Daughter Jasmine December would like to rent 22" wide w/c.  Adding wheels to the personal walker may be an option; recommended she speak to CSW.  Family ed with Jasmine December for toilet transfer to commode chair, using RW, without use of wall rail.  Standing activity with RW,  kicking ball with R and L LEs x 10 each.  Up/down 2 ( 5" )high steps with 2 rails, mod assist.  Gait with RW as above, x 50'.  Pt fatigued and required 2 standing rest breaks in 50'.    Therapy Documentation Precautions:  Precautions Precautions: Fall Precaution Comments:  Pt with colostomy, PEG, mid abdominal wound site, healing trach site with small air leak; previous R TKA Restrictions Weight Bearing Restrictions: No    See FIM for current functional status  Therapy/Group: Individual Therapy  Susa Loffler 12/07/2013, 4:06 PM

## 2013-12-07 NOTE — Progress Notes (Signed)
Speech Language Pathology Weekly Progress and Session Note  Patient Details  Name: Sarah Tran MRN: [id number] Date of Birth: Sep 15, 1945  Beginning of progress report period: Nov 30, 2013 End of progress report period: Dec 07, 2013  Today's Date: 12/07/2013 Time: 5400-8676  Time Calculation (min): 26  Short Term Goals: Week 2: SLP Short Term Goal 1 (Week 2): Pt will utilize safe swallowing strategies with recommended diet with modified independence SLP Short Term Goal 1 - Progress (Week 2): Met SLP Short Term Goal 2 (Week 2): Pt will demonstrate basic problem solving during familiar, functional tasks with min cues SLP Short Term Goal 2 - Progress (Week 2): Partly met SLP Short Term Goal 3 (Week 2): Pt will verbalize at least at least 1 physical and 2 cognitive impairments with min  cues SLP Short Term Goal 3 - Progress (Week 2): Partly met SLP Short Term Goal 4 (Week 2): Pt will utilize external memory aids to facilitate recall of new and daily information with Min cues SLP Short Term Goal 4 - Progress (Week 2): Met SLP Short Term Goal 5 (Week 2): Pt will improve alternating/divided attention to functional task for 10 minutes with Min cues SLP Short Term Goal 5 - Progress (Week 2): Met    New Short Term Goals: Week 3: SLP Short Term Goal 1 (Week 3): Pt will demonstrate basic problem solving during familiar, functional tasks with min cues SLP Short Term Goal 2 (Week 3): Pt will verbalize at least at least 1 physical and 2 cognitive impairments as well as their impact on pt's independence in her home environment with min cues.  SLP Short Term Goal 3 (Week 3): Pt will utilize external memory aids to facilitate recall of new and daily information with supervision.   SLP Short Term Goal 4 (Week 3): Pt will improve alternating/divided attention to functional task for 15-20 minutes with Min cues  Weekly Progress Updates: Patient has made good gains and has met 3 of 5 short term goals this  reporting period due to improved memory, attention, and problem solving.  Currently, patient continues to require min assist for thought organization, mental fleixilbity, delayed recall, and emergent awareness.   Patient and family education is ongoing. Patient would benefit from continued skilled SLP intervention to maximize recall, problem solving, high-level attention and awareness in order to maximize her functional independence prior to discharge.    Intensity: Minumum of 1-2 x/day, 30 to 90 minutes Frequency: 5 out of 7 days Duration/Length of Stay: 20-24 days Treatment/Interventions: Cognitive remediation/compensation;Cueing hierarchy;Dysphagia/aspiration precaution training;Environmental controls;Functional tasks;Internal/external aids;Patient/family education;Speech/Language facilitation   Daily Session  Skilled Therapeutic Interventions:  Pt was seen for skilled speech therapy targeting family education.  Pt's brother and two daughters were present and reported that pt was only 50% returned to baseline for higher level cognition.  SLP provided education related to current goals, progress in therapy, and discharge recommendations for supervision for safety awareness/insight, as well as assist for medication and financial management.  Pt's family verified her previously provided history that she lived with her daughter and was very independent prior to her hospitalization.  Given that pt remains significantly altered from her baseline, recommend continued speech therapy services upon discharge.  Both pt and her family are in agreement.         FIM:  Comprehension Comprehension: 5-Understands complex 90% of the time/Cues < 10% of the time Expression Expression: 5-Expresses basic needs/ideas: With no assist Social Interaction Social Interaction: 6-Interacts appropriately with others with  medication or extra time (anti-anxiety, antidepressant). Problem Solving Problem Solving: 5-Solves  basic 90% of the time/requires cueing < 10% of the time Memory Memory: 5-Recognizes or recalls 90% of the time/requires cueing < 10% of the time FIM - Eating Eating Activity: 6: More than reasonable amount of time General   4 minutes Pain Pain Assessment Pain Assessment: No/denies pain  Therapy/Group: Individual Therapy  Windell Moulding, M.A. CCC-SLP  Selinda Orion Hedy Garro 12/07/2013, 12:57 PM

## 2013-12-07 NOTE — Progress Notes (Signed)
Occupational Therapy Session Note  Patient Details  Name: Sarah Tran MRN: [id number] Date of Birth: 1945-07-25  Today's Date: 12/07/2013 Time: 0804-0900 Time Calculation (min): 56 min  Short Term Goals: Week 1:  OT Short Term Goal 1 (Week 1): Pt will be able to stand at EOB with RW for 1 min with mod A to be able to pull pants over hips. OT Short Term Goal 1 - Progress (Week 1): Met OT Short Term Goal 2 (Week 1): Pt will don shirt with min A. OT Short Term Goal 2 - Progress (Week 1): Met OT Short Term Goal 3 (Week 1): Pt will demonstrate improved BUE shoulder AROM to lift her arms to be able to wash under them without assist. OT Short Term Goal 3 - Progress (Week 1): Met OT Short Term Goal 4 (Week 1): Pt will transfer to drop arm BSC with mod A x1.  OT Short Term Goal 4 - Progress (Week 1): Met Week 2:  OT Short Term Goal 1 (Week 2): Pt will be able to ambulate in and out of bathroom with RW with steady A.  OT Short Term Goal 2 (Week 2): Pt will be able to toilet with mod I.  OT Short Term Goal 3 (Week 2): Pt will be able to dress LB with set up except for shoes and socks. OT Short Term Goal 4 (Week 2): Pt will be able to transfer on and off a tub bench (simulated with clothing on) with min A.  Skilled Therapeutic Interventions/Progress Updates:      Pt seen for BADL retraining of toileting, bathing, and dressing with a focus on functional mobility and activity tolerance. Pt in bed with O2 on, stating she was feeling SOB last night. O2 removed for OT session and O2 sats checked 5x all at 96-97%.  Pt ate breakfast from EOB and then ambulated to toilet 2x during session to use bathroom and then to bathe using RW all with supervision. Pt bathed and dressed with supervision except for assist to don shoes. Pt stood numerous times without UE support with supervision. Pt continually stated that she did not feel that she was performing well. Pt was reminded numerous times that this is the most  independent she has been with her transfers and mobility. RT arrived at end of session to provide breathing treatment.  Pt resting in w/c at end of the session.    Therapy Documentation Precautions:  Precautions Precautions: Fall Precaution Comments: Pt with colostomy, PEG, mid abdominal wound site, healing trach site with small air leak; previous R TKA Restrictions Weight Bearing Restrictions: No    Vital Signs: Therapy Vitals Temp: 98 F (36.7 C) Temp src: Oral Pulse Rate: 84 Resp: 18 BP: 164/77 mmHg Patient Position (if appropriate): Lying Oxygen Therapy SpO2: 98 % O2 Device: None (Room air) Pain: Pain Assessment Pain Assessment: No/denies pain ADL: ADL ADL Comments: Refer to FIM  See FIM for current functional status  Therapy/Group: Individual Therapy  Harlene Ramus 12/07/2013, 8:56 AM

## 2013-12-07 NOTE — Progress Notes (Signed)
ANTICOAGULATION CONSULT NOTE - Follow Up Consult  Pharmacy Consult for coumadin Indication: afib + hx DVT  Allergies  Allergen Reactions  . Codeine Other (See Comments)    Pass out    . Peanut Butter Flavor Hives and Other (See Comments)    Lockjaw   . Penicillins Swelling    Patient Measurements: Height: 5' 2.99" (160 cm) Weight: 231 lb 11.3 oz (105.1 kg) IBW/kg (Calculated) : 52.38 Heparin Dosing Weight:   Vital Signs: Temp: 98 F (36.7 C) (05/27 0527) Temp src: Oral (05/27 0527) BP: 164/77 mmHg (05/27 0527) Pulse Rate: 84 (05/27 0527)  Labs:  Recent Labs  12/05/13 0532 12/06/13 0624 12/07/13 0600  LABPROT 38.0* 39.3* 36.9*  INR 4.08* 4.26* 3.92*  CREATININE  --  2.29*  --     Estimated Creatinine Clearance: 28.4 ml/min (by C-G formula based on Cr of 2.29).   Medications:  Scheduled:  . atorvastatin  10 mg Oral q1800  . carvedilol  12.5 mg Oral BID WC  . clonazePAM  0.5 mg Oral BID  . famotidine  20 mg Oral Daily  . furosemide  40 mg Oral Daily  . lactobacillus  1 g Oral TID WC  . methimazole  5 mg Oral Daily  . nitroGLYCERIN  0.2 mg Transdermal Daily  . potassium chloride  40 mEq Oral Daily  . predniSONE  15 mg Oral Q breakfast  . senna-docusate  1 tablet Oral BID  . sodium bicarbonate  1,300 mg Oral BID  . timolol  1 drop Both Eyes BID  . vitamin C  500 mg Oral BID  . Warfarin - Pharmacist Dosing Inpatient   Does not apply q1800  . zinc sulfate  220 mg Oral Daily   Infusions:    Assessment: 68 yo female with hx of DVT is currently on supratherapeutic coumadin but INR is down to 3.92 from 4.26. Goal of Therapy:  INR 2-3 Monitor platelets by anticoagulation protocol: Yes   Plan:  1) No coumadin tonight 2) INR in am  Tsz-Yin Verdella Laidlaw 12/07/2013,8:14 AM

## 2013-12-07 NOTE — Progress Notes (Signed)
Social Work Lucy Chris, LCSW Social Worker Signed  Patient Care Conference Service date: 12/07/2013 1:00 PM  Inpatient RehabilitationTeam Conference and Plan of Care Update Date: 12/07/2013   Time: 11:00 AM     Patient Name: Sarah Tran       Medical Record Number: [id number]   Date of Birth: [birth year] Sex: Female         Room/Bed: 4W02C/4W02C-01 Payor Info: Payor: MEDICARE / Plan: MEDICARE PART A AND B / Product Type: *No Product type* /   Admitting Diagnosis: Deconditioned   Admit Date/Time:  11/22/2013  4:33 PM Admission Comments: No comment available   Primary Diagnosis:  <principal problem not specified> Principal Problem: <principal problem not specified>    Patient Active Problem List     Diagnosis  Date Noted   .  Physical deconditioning  11/22/2013   .  Acute on chronic respiratory failure  10/26/2013   .  Tracheostomy status  10/26/2013   .  Toxic metabolic encephalopathy  10/26/2013   .  Hypervolemia  10/26/2013   .  Acute on chronic renal insufficiency  10/26/2013   .  DM2 (diabetes mellitus, type 2)  10/26/2013   .  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  Pt will become more familiar with colostomy and care for it  Continue to educate pt and have her take a more hands  on approach to her colostomy care   Swallow/Nutrition/ Hydration     regular solids, thin liquids, intermittent supervision   supervision with least restrictive diet  diet tolerance, discharge supervision recommendations   ADL's     min bathing, mod LB dressing, min A toilet transfers, mod A tub bench transfers  min A overall  ADL retraining, functional mobility training, pt/family education   Mobility     Bed mobility Mod A, Min/mod A RW transfers, Min A gait xup tp 100,' cognition continues to improve, attempted stairs but unable,  Supervision basic transfers, Min A bed mobility and furniture/car transfers, S gait x150' in controlled setting  What can we do for lymphedema, XXL wraps?; therex, bed mobility with leg lifter, family training and ed on home modification and healthy lifestyle, gait in varying conditions   Communication     n/a  n/a  n/a   Safety/Cognition/ Behavioral Observations    Min-mod assist for memory, alternating/divided attention, problem solving   min assist-supervision   carryover of compensatory strategies    Pain     Pt reports no pain  Pt to remain without pain and or a pain level of 3 or less on a scale of 0 to 10  Assess for pain Qshift and as needed    Skin     Pt has multiple skin issues  Maintain skin integrity and prevent any further breakdown  Assess skin Qshift and as needed     *See Care Plan and progress notes for long and short-term goals.    Barriers to Discharge:  Morbid obesity      Possible Resolutions to Barriers:    Continue rehabilitation, family training      Discharge Planning/Teaching Needs:    HOme with family providing 24 hr care-need to do family training with ones assisting with colostomy      Team Discussion:    Making good progress, family here to go through therapies and learn colostomy care.  Reaching goals sooner   Revisions to Treatment Plan:    Moving up discharge date to Monday 6/1    Continued Need for Acute  Rehabilitation Level of Care: The patient requires daily medical management by a physician with specialized training in physical medicine and rehabilitation for the following conditions: Daily direction of a multidisciplinary physical rehabilitation program to ensure safe treatment while eliciting the highest outcome that is of practical value to the patient.: Yes Daily medical management of patient stability for increased activity during participation in an intensive rehabilitation regime.: Yes Daily analysis of laboratory values and/or radiology reports with any subsequent need for medication adjustment of medical intervention for : Neurological problems;Post surgical problems;Pulmonary problems  Lucy Chris 12/07/2013, 1:00 PM         Lucy Chris, LCSW Social Worker Signed  Patient Care Conference Service date: 11/30/2013 4:12 PM  Inpatient RehabilitationTeam Conference and Plan of Care Update Date: 11/30/2013   Time: 10;45 Am     Patient Name: Sarah Tran       Medical Record Number: [id number]   Date of Birth: [birth year] Sex: Female         Room/Bed: 4W02C/4W02C-01 Payor Info: Payor: MEDICARE / Plan: MEDICARE PART A AND B / Product Type: *No Product type* /   Admitting Diagnosis: Deconditioned   Admit Date/Time:  11/22/2013  4:33 PM Admission Comments: No comment available   Primary Diagnosis:  <principal problem not specified> Principal Problem: <principal problem not specified>    Patient Active Problem List     Diagnosis  Date Noted   .  Physical deconditioning  11/22/2013   .  Acute on chronic respiratory failure  10/26/2013   .  Tracheostomy status  10/26/2013   .  Toxic metabolic encephalopathy  10/26/2013   .  Hypervolemia  10/26/2013   .  Acute on chronic renal insufficiency  10/26/2013   .  DM2 (diabetes mellitus, type 2)  10/26/2013   .  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 Claudette Laws Social Worker Present: Dossie Der, LCSW Nurse Present: Ronny Bacon, RN PT Present: Edman Circle, PT;Caroline Adriana Simas, PT;Blair Hobble, PT OT Present: Bretta Bang, OT SLP Present: Fae Pippin, SLP PPS Coordinator present : Edson Snowball, Chapman Fitch, RN, CRRN        Current Status/Progress  Goal  Weekly Team Focus   Medical     severe deconditioning hip weakness, question CIM  maintain stability  manage cough   Bowel/Bladder     Colostomy bag/ bladder  BSC  Skin around stoma free from breakdown/infection  Continue to monitor consistency of stool. Assess skin around the stoma   Swallow/Nutrition/ Hydration     regular solids, thin liquids intermittent suprevision   supervision with least restrictive diet  diet tolerance, discharge supervision recommendations   ADL's  mod - max overall, progressing with endurance, mobility, use of BUEs  min A overall  ADL retraining, functional mobility training, pt/family education   Mobility     Bed mobility Mod A, Min A sliding baord transfers, Mod A gait with RW x12,' memory and processing improving  Supervision trnasfers, bed moblity, and gait x50'  activity tolerance, strength, transfers, and gait    Communication     n/a  n/a  n/a   Safety/Cognition/ Behavioral Observations    min-mod assist   min assist   carryover of compensatory strategies for problem solving, awareness, memory   Pain     n/a. Tylenol 650 mg PRN q 4 hr.  Pain level 3 or less on a scale of 0-10.  Monitor any onset of pain   Skin     Multiple open areas on skin.  No new skin breakdown/infection  Assess skin q shift.     *See Care Plan and progress notes for long and short-term goals.    Barriers to Discharge:  morbid obesity, multi med issues      Possible Resolutions to Barriers:    cont rehab, adequate LOS      Discharge Planning/Teaching Needs:    Family to provide 24 hr care-committed  to pt and will need to begin education soon, much to learn      Team Discussion:    WOC beginning colostomy teaching with family.  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  Regaining strength slowly.  Family aware 24 hr care   Revisions to Treatment Plan:    None    Continued Need for Acute Rehabilitation Level of Care: The patient requires daily medical management by a physician with specialized training in physical medicine and rehabilitation for the following conditions: Daily direction of a multidisciplinary physical rehabilitation program to ensure safe treatment while eliciting the highest outcome that is of practical value to the patient.: Yes Daily medical management of patient stability for increased activity during participation in an intensive rehabilitation regime.: Yes Daily analysis of laboratory values and/or radiology reports with any subsequent need for medication adjustment of medical intervention for : Pulmonary problems;Post surgical problems;Other  Lucy ChrisRebecca G Ajanae Virag 11/30/2013, 4:12 PM          Patient ID: Sydnee CabalNancy Kutch, female   DOB: 03/29/1946, 68 y.o.   MRN: [id number]

## 2013-12-07 NOTE — Progress Notes (Addendum)
Subjective/Complaints: 68 year old right-handed morbidly obese African American female. Past medical history of hypertension, renal insufficiency, obstructive sleep apnea with CPAP, DVT/A. fib with chronic Coumadin. Hospital course patient admitted to [facility] 09/28/2013 for removal of a ileal polyp. Noted perioperative complications balloon catheter became dislodged. She required exploratory laparotomy and diverting colostomy as well as appendectomy secondary to perforated sigmoid colon. Her hospital was complicated by respiratory failure requiring intubation and eventually tracheostomy as well his gastrostomy tube for nutritional support. She remained in intensive care for 28 days. Patient developed sepsis cultures growing gram-positive cocci in clusters maintained on broad-spectrum antibiotics. [facility] stabilized [facility] for further vent weaning and wound care. An echocardiogram is completed 10/28/2013 showing mild LVH with ejection fraction of 55-60%. 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   SOB started after difficulty with ostomy bag last noc.  Doing well with physical therapy this morning. Supervision with transfers. Discussed with RN, patient received breathing treatment this morning O2 sats 98% on room air  Review of Systems - otherwise negative Objective: Vital Signs: Blood pressure 164/77, pulse 84, temperature 98 F (36.7 C), temperature source Oral, resp. rate 18, height 5' 2.99" (1.6 m), weight 105.1 kg (231 lb 11.3 oz), SpO2 98.00%. No results found. Results for orders placed during the hospital encounter of 11/22/13 (from the past 72 hour(s))  GLUCOSE, CAPILLARY     Status: None   Collection Time    12/04/13 11:19 AM      Result Value Ref Range   Glucose-Capillary 70  70 - 99 mg/dL   Comment 1  Notify RN    GLUCOSE, CAPILLARY     Status: Abnormal   Collection Time    12/04/13  5:34 PM      Result Value Ref Range   Glucose-Capillary 133 (*) 70 - 99 mg/dL  GLUCOSE, CAPILLARY     Status: Abnormal   Collection Time    12/04/13  9:45 PM      Result Value Ref Range   Glucose-Capillary 241 (*) 70 - 99 mg/dL   Comment 1 Notify RN    PROTIME-INR     Status: Abnormal   Collection Time    12/05/13  5:32 AM      Result Value Ref Range   Prothrombin Time 38.0 (*) 11.6 - 15.2 seconds   INR 4.08 (*) 0.00 - 1.49  GLUCOSE, CAPILLARY     Status: None   Collection Time    12/05/13  7:21 AM      Result Value Ref Range   Glucose-Capillary 75  70 - 99 mg/dL  GLUCOSE, CAPILLARY     Status: Abnormal   Collection Time    12/05/13 11:29 AM      Result Value Ref Range   Glucose-Capillary 56 (*) 70 - 99 mg/dL  GLUCOSE, CAPILLARY     Status: None   Collection Time    12/05/13 12:22 PM      Result Value Ref Range   Glucose-Capillary 79  70 - 99 mg/dL   Comment 1 Notify RN    GLUCOSE, CAPILLARY     Status: Abnormal   Collection Time    12/05/13  4:31 PM      Result Value Ref Range   Glucose-Capillary 125 (*) 70 - 99 mg/dL  GLUCOSE, CAPILLARY     Status: Abnormal   Collection Time  12/05/13  9:13 PM      Result Value Ref Range   Glucose-Capillary 128 (*) 70 - 99 mg/dL   Comment 1 Notify RN    PROTIME-INR     Status: Abnormal   Collection Time    12/06/13  6:24 AM      Result Value Ref Range   Prothrombin Time 39.3 (*) 11.6 - 15.2 seconds   INR 4.26 (*) 0.00 - 5.28  BASIC METABOLIC PANEL     Status: Abnormal   Collection Time    12/06/13  6:24 AM      Result Value Ref Range   Sodium 140  137 - 147 mEq/L   Potassium 3.8  3.7 - 5.3 mEq/L   Chloride 104  96 - 112 mEq/L   CO2 22  19 - 32 mEq/L   Glucose, Bld 76  70 - 99 mg/dL   BUN 46 (*) 6 - 23 mg/dL   Creatinine, Ser 2.29 (*) 0.50 - 1.10 mg/dL   Calcium 8.9  8.4 - 10.5 mg/dL   GFR calc non Af Amer 21 (*) >90 mL/min   GFR calc  Af Amer 25 (*) >90 mL/min   Comment: (NOTE)     The eGFR has been calculated using the CKD EPI equation.     This calculation has not been validated in all clinical situations.     eGFR's persistently <90 mL/min signify possible Chronic Kidney     Disease.  GLUCOSE, CAPILLARY     Status: None   Collection Time    12/06/13  7:11 AM      Result Value Ref Range   Glucose-Capillary 71  70 - 99 mg/dL   Comment 1 Notify RN    GLUCOSE, CAPILLARY     Status: None   Collection Time    12/06/13 12:28 PM      Result Value Ref Range   Glucose-Capillary 95  70 - 99 mg/dL   Comment 1 Notify RN    GLUCOSE, CAPILLARY     Status: Abnormal   Collection Time    12/06/13  4:22 PM      Result Value Ref Range   Glucose-Capillary 233 (*) 70 - 99 mg/dL   Comment 1 Notify RN    OCCULT BLOOD X 1 CARD TO LAB, STOOL     Status: Abnormal   Collection Time    12/06/13  7:33 PM      Result Value Ref Range   Fecal Occult Bld POSITIVE (*) NEGATIVE  GLUCOSE, CAPILLARY     Status: Abnormal   Collection Time    12/06/13  9:08 PM      Result Value Ref Range   Glucose-Capillary 163 (*) 70 - 99 mg/dL  PROTIME-INR     Status: Abnormal   Collection Time    12/07/13  6:00 AM      Result Value Ref Range   Prothrombin Time 36.9 (*) 11.6 - 15.2 seconds   INR 3.92 (*) 0.00 - 1.49  GLUCOSE, CAPILLARY     Status: None   Collection Time    12/07/13  7:14 AM      Result Value Ref Range   Glucose-Capillary 87  70 - 99 mg/dL   Comment 1 Notify RN       HEENT: healing trach site with air leak  General: No acute distress Mood and affect are appropriate Heart: Regular rate and rhythm no rubs murmurs or extra sounds Lungs: Clear to auscultation, breathing unlabored, no  rales or wheezes Abdomen: Positive bowel sounds, soft nontender to palpation, nondistended. Extremities: No clubbing, cyanosis, or edema, G tube site healed Skin: No evidence of breakdown, no evidence of rash Neurologic: Cranial nerves II through XII  intact, motor strength is 3/5 in bilateral deltoid, 4/5 bicep, tricep, grip, 3-/5 R hip and Knee flexor,4- knee extensors,3- Left Hip flexor  ankle dorsiflexor and plantar flexor    Musculoskeletal: Tenderness mild right Achilles  Assessment/Plan: 1. Functional deficits secondary to Critical illness myopathy,  encephalopathy which require 3+ hours per day of interdisciplinary therapy in a comprehensive inpatient rehab setting. Physiatrist is providing close team supervision and 24 hour management of active medical problems listed below. Physiatrist and rehab team continue to assess barriers to discharge/monitor patient progress toward functional and medical goals. Team conference today please see physician documentation under team conference tab, met with team face-to-face to discuss problems,progress, and goals. Formulized individual treatment plan based on medical history, underlying problem and comorbidities. FIM: FIM - Bathing Bathing Steps Patient Completed: Chest;Right Arm;Left Arm;Abdomen;Right upper leg;Left upper leg;Front perineal area;Buttocks;Right lower leg (including foot);Left lower leg (including foot) Bathing: 5: Supervision: Safety issues/verbal cues  FIM - Upper Body Dressing/Undressing Upper body dressing/undressing steps patient completed: Thread/unthread right sleeve of pullover shirt/dresss;Thread/unthread left sleeve of pullover shirt/dress;Put head through opening of pull over shirt/dress;Pull shirt over trunk Upper body dressing/undressing: 5: Supervision: Safety issues/verbal cues FIM - Lower Body Dressing/Undressing Lower body dressing/undressing steps patient completed: Thread/unthread right underwear leg;Thread/unthread left underwear leg;Thread/unthread right pants leg;Thread/unthread left pants leg;Pull pants up/down;Pull underwear up/down;Don/Doff left sock;Don/Doff right sock Lower body dressing/undressing: 3: Mod-Patient completed 50-74% of tasks  FIM -  Toileting Toileting steps completed by patient: Adjust clothing prior to toileting;Performs perineal hygiene;Adjust clothing after toileting Toileting Assistive Devices: Grab bar or rail for support Toileting: 5: Supervision: Safety issues/verbal cues  FIM - Radio producer Devices: Walker;Elevated toilet seat;Grab bars Toilet Transfers: 5-From toilet/BSC: Supervision (verbal cues/safety issues);5-To toilet/BSC: Supervision (verbal cues/safety issues)  FIM - Engineer, site Assistive Devices: Arm rests;Walker (Leg lifter) Bed/Chair Transfer: 5: Supine > Sit: Supervision (verbal cues/safety issues);5: Sit > Supine: Supervision (verbal cues/safety issues);5: Chair or W/C > Bed: Supervision (verbal cues/safety issues);5: Bed > Chair or W/C: Supervision (verbal cues/safety issues)  FIM - Locomotion: Wheelchair Distance: 50 Locomotion: Wheelchair: 0: Activity did not occur FIM - Locomotion: Ambulation Locomotion: Ambulation Assistive Devices: Administrator Ambulation/Gait Assistance: 5: Supervision Locomotion: Ambulation: 1: Travels less than 50 ft with supervision/safety issues  Comprehension Comprehension Mode: Auditory Comprehension: 5-Understands complex 90% of the time/Cues < 10% of the time  Expression Expression Mode: Verbal Expression: 5-Expresses basic needs/ideas: With no assist  Social Interaction Social Interaction: 6-Interacts appropriately with others with medication or extra time (anti-anxiety, antidepressant).  Problem Solving Problem Solving: 5-Solves basic 90% of the time/requires cueing < 10% of the time  Memory Memory: 5-Recognizes or recalls 90% of the time/requires cueing < 10% of the time   Medical Problem List and Plan:  1. 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 DVT Prophylaxis/Anticoagulation: Subcutaneous heparin discontinued  and chronic Coumadin resumed 11/22/2013 without bridging. . Doppler showed possible Left popliteal DVT in a technically limited study, no progressive symptoms.  INR supratherapeutic 3. Pain Management: Oxycodone immediate release 5 mg every 6 hours as needed. Added kpad for right thigh 4. Mood: Clonazepam 0.5 mg 3 times a day. positive reinforcement  5. Neuropsych: This patient is capable of  making decisions on her own behalf.  6. 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 Shortness of breath today appears to be anxiety related, her RN the patient is due for some Klonopin 7. Dysphagia. Mechanical soft diet. Gastrostomy tube feeds on hold since admission Should be able to D/C PEG  8. Diabetes mellitus. Glipizide will d/c  Checking blood sugars a.c. and at bedtime. Sugars low in am. D/C levemir 5/18 9. Hypertension/atrial fibrillation.Uncontrolled Lasix 40 mg daily, Norvasc 10 mg daily,  Given HR in 80s increase Coreg 12.5 mg twice a day  10. Hypothyroidism. Tapazole 5 mg daily. Latest TSH level 2.230  11. Chronic anemia. Last hgb 8.7 monitor, check stool guaic 12. Obstructive sleep apnea. Resumed CPAP.--pt seems to be tolerating well  LOS (Days) [place] E Kirsteins 12/07/2013, 9:29 AM

## 2013-12-08 ENCOUNTER — Inpatient Hospital Stay (HOSPITAL_COMMUNITY): Payer: Medicare Other | Admitting: Occupational Therapy

## 2013-12-08 ENCOUNTER — Inpatient Hospital Stay (HOSPITAL_COMMUNITY): Payer: Medicare Other | Admitting: Speech Pathology

## 2013-12-08 ENCOUNTER — Inpatient Hospital Stay (HOSPITAL_COMMUNITY): Payer: Medicare Other

## 2013-12-08 ENCOUNTER — Encounter (HOSPITAL_COMMUNITY): Payer: Medicare Other

## 2013-12-08 DIAGNOSIS — E119 Type 2 diabetes mellitus without complications: Secondary | ICD-10-CM

## 2013-12-08 DIAGNOSIS — N289 Disorder of kidney and ureter, unspecified: Secondary | ICD-10-CM

## 2013-12-08 DIAGNOSIS — J9620 Acute and chronic respiratory failure, unspecified whether with hypoxia or hypercapnia: Secondary | ICD-10-CM

## 2013-12-08 DIAGNOSIS — R5381 Other malaise: Secondary | ICD-10-CM

## 2013-12-08 LAB — GLUCOSE, CAPILLARY
GLUCOSE-CAPILLARY: 130 mg/dL — AB (ref 70–99)
GLUCOSE-CAPILLARY: 208 mg/dL — AB (ref 70–99)
GLUCOSE-CAPILLARY: 215 mg/dL — AB (ref 70–99)
Glucose-Capillary: 84 mg/dL (ref 70–99)
Glucose-Capillary: 97 mg/dL (ref 70–99)
Glucose-Capillary: 116 mg/dL — ABNORMAL HIGH (ref 70–99)

## 2013-12-08 LAB — PROTIME-INR
INR: 3.52 — ABNORMAL HIGH (ref 0.00–1.49)
Prothrombin Time: 34 seconds — ABNORMAL HIGH (ref 11.6–15.2)

## 2013-12-08 MED ORDER — CARVEDILOL 25 MG PO TABS
25.0000 mg | ORAL_TABLET | Freq: Two times a day (BID) | ORAL | Status: DC
  Administered 2013-12-08 – 2013-12-12 (×9): 25 mg via ORAL
  Filled 2013-12-08 (×11): qty 1

## 2013-12-08 NOTE — Progress Notes (Signed)
Subjective/Complaints: 68 year old right-handed morbidly obese African American female. Past medical history of hypertension, renal insufficiency, obstructive sleep apnea with CPAP, DVT/A. fib with chronic Coumadin. Hospital course patient admitted to [facility] 09/28/2013 for removal of a ileal polyp. Noted perioperative complications balloon catheter became dislodged. She required exploratory laparotomy and diverting colostomy as well as appendectomy secondary to perforated sigmoid colon. Her hospital was complicated by respiratory failure requiring intubation and eventually tracheostomy as well his gastrostomy tube for nutritional support. She remained in intensive care for 28 days. Patient developed sepsis cultures growing gram-positive cocci in clusters maintained on broad-spectrum antibiotics. [facility] stabilized [facility] for further vent weaning and wound care. An echocardiogram is completed 10/28/2013 showing mild LVH with ejection fraction of 55-60%. 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   SOB started after difficulty with ostomy bag last noc.  Doing well with physical therapy this morning. Supervision with transfers. Discussed with RN, patient received breathing treatment this morning O2 sats 98% on room air  Review of Systems - otherwise negative Objective: Vital Signs: Blood pressure 174/84, pulse 76, temperature 98.1 F (36.7 C), temperature source Oral, resp. rate 19, height 5' 2.99" (1.6 m), weight 105.1 kg (231 lb 11.3 oz), SpO2 100.00%. No results found. Results for orders placed during the hospital encounter of 11/22/13 (from the past 72 hour(s))  GLUCOSE, CAPILLARY     Status: Abnormal   Collection Time    12/05/13 11:29 AM      Result Value Ref Range   Glucose-Capillary 56 (*) 70 - 99 mg/dL   GLUCOSE, CAPILLARY     Status: None   Collection Time    12/05/13 12:22 PM      Result Value Ref Range   Glucose-Capillary 79  70 - 99 mg/dL   Comment 1 Notify RN    GLUCOSE, CAPILLARY     Status: Abnormal   Collection Time    12/05/13  4:31 PM      Result Value Ref Range   Glucose-Capillary 125 (*) 70 - 99 mg/dL  GLUCOSE, CAPILLARY     Status: Abnormal   Collection Time    12/05/13  9:13 PM      Result Value Ref Range   Glucose-Capillary 128 (*) 70 - 99 mg/dL   Comment 1 Notify RN    PROTIME-INR     Status: Abnormal   Collection Time    12/06/13  6:24 AM      Result Value Ref Range   Prothrombin Time 39.3 (*) 11.6 - 15.2 seconds   INR 4.26 (*) 0.00 - 5.78  BASIC METABOLIC PANEL     Status: Abnormal   Collection Time    12/06/13  6:24 AM      Result Value Ref Range   Sodium 140  137 - 147 mEq/L   Potassium 3.8  3.7 - 5.3 mEq/L   Chloride 104  96 - 112 mEq/L   CO2 22  19 - 32 mEq/L   Glucose, Bld 76  70 - 99 mg/dL   BUN 46 (*) 6 - 23 mg/dL   Creatinine, Ser 2.29 (*) 0.50 - 1.10 mg/dL   Calcium 8.9  8.4 - 10.5 mg/dL   GFR calc non Af Amer 21 (*) >90 mL/min   GFR calc Af Amer 25 (*) >90 mL/min   Comment: (NOTE)     The  eGFR has been calculated using the CKD EPI equation.     This calculation has not been validated in all clinical situations.     eGFR's persistently <90 mL/min signify possible Chronic Kidney     Disease.  GLUCOSE, CAPILLARY     Status: None   Collection Time    12/06/13  7:11 AM      Result Value Ref Range   Glucose-Capillary 71  70 - 99 mg/dL   Comment 1 Notify RN    GLUCOSE, CAPILLARY     Status: None   Collection Time    12/06/13 12:28 PM      Result Value Ref Range   Glucose-Capillary 95  70 - 99 mg/dL   Comment 1 Notify RN    GLUCOSE, CAPILLARY     Status: Abnormal   Collection Time    12/06/13  4:22 PM      Result Value Ref Range   Glucose-Capillary 233 (*) 70 - 99 mg/dL   Comment 1 Notify RN    OCCULT BLOOD X 1 CARD TO LAB, STOOL      Status: Abnormal   Collection Time    12/06/13  7:33 PM      Result Value Ref Range   Fecal Occult Bld POSITIVE (*) NEGATIVE  GLUCOSE, CAPILLARY     Status: Abnormal   Collection Time    12/06/13  9:08 PM      Result Value Ref Range   Glucose-Capillary 163 (*) 70 - 99 mg/dL  PROTIME-INR     Status: Abnormal   Collection Time    12/07/13  6:00 AM      Result Value Ref Range   Prothrombin Time 36.9 (*) 11.6 - 15.2 seconds   INR 3.92 (*) 0.00 - 1.49  GLUCOSE, CAPILLARY     Status: None   Collection Time    12/07/13  7:14 AM      Result Value Ref Range   Glucose-Capillary 87  70 - 99 mg/dL   Comment 1 Notify RN    GLUCOSE, CAPILLARY     Status: Abnormal   Collection Time    12/07/13 12:00 PM      Result Value Ref Range   Glucose-Capillary 112 (*) 70 - 99 mg/dL   Comment 1 Notify RN    GLUCOSE, CAPILLARY     Status: Abnormal   Collection Time    12/07/13  4:42 PM      Result Value Ref Range   Glucose-Capillary 193 (*) 70 - 99 mg/dL   Comment 1 Notify RN    GLUCOSE, CAPILLARY     Status: Abnormal   Collection Time    12/07/13  9:06 PM      Result Value Ref Range   Glucose-Capillary 130 (*) 70 - 99 mg/dL  PROTIME-INR     Status: Abnormal   Collection Time    12/08/13  5:20 AM      Result Value Ref Range   Prothrombin Time 34.0 (*) 11.6 - 15.2 seconds   INR 3.52 (*) 0.00 - 1.49  GLUCOSE, CAPILLARY     Status: None   Collection Time    12/08/13  7:20 AM      Result Value Ref Range   Glucose-Capillary 84  70 - 99 mg/dL   Comment 1 Notify RN       HEENT: healing trach site with air leak General: No acute distress Mood and affect are appropriate Heart: Regular rate and rhythm no rubs murmurs  or extra sounds Lungs: Clear to auscultation, breathing unlabored, no rales or wheezes Abdomen: Positive bowel sounds, soft nontender to palpation, nondistended. Extremities: No clubbing, cyanosis, or edema, G tube site healed Skin: No evidence of breakdown, no evidence of  rash Neurologic: Cranial nerves II through XII intact, motor strength is 3-/5 in bilateral deltoid, 4/5 bicep, tricep, grip, 3-/5 R hip and Knee flexor,4- knee extensors,3- Left Hip flexor  ankle dorsiflexor and plantar flexor    Musculoskeletal: Tenderness mild right Achilles  Assessment/Plan: 1. Functional deficits secondary to Critical illness myopathy,  encephalopathy which require 3+ hours per day of interdisciplinary therapy in a comprehensive inpatient rehab setting. Physiatrist is providing close team supervision and 24 hour management of active medical problems listed below. Physiatrist and rehab team continue to assess barriers to discharge/monitor patient progress toward functional and medical goals.  FIM: FIM - Bathing Bathing Steps Patient Completed: Chest;Right Arm;Left Arm;Abdomen;Right upper leg;Left upper leg;Front perineal area;Buttocks;Right lower leg (including foot);Left lower leg (including foot) Bathing: 5: Supervision: Safety issues/verbal cues  FIM - Upper Body Dressing/Undressing Upper body dressing/undressing steps patient completed: Thread/unthread right sleeve of pullover shirt/dresss;Thread/unthread left sleeve of pullover shirt/dress;Put head through opening of pull over shirt/dress;Pull shirt over trunk Upper body dressing/undressing: 5: Supervision: Safety issues/verbal cues FIM - Lower Body Dressing/Undressing Lower body dressing/undressing steps patient completed: Thread/unthread right underwear leg;Thread/unthread left underwear leg;Thread/unthread right pants leg;Thread/unthread left pants leg;Pull pants up/down;Pull underwear up/down;Don/Doff left sock;Don/Doff right sock Lower body dressing/undressing: 3: Mod-Patient completed 50-74% of tasks  FIM - Toileting Toileting steps completed by patient: Adjust clothing prior to toileting;Performs perineal hygiene;Adjust clothing after toileting Toileting Assistive Devices: Grab bar or rail for support Toileting:  5: Supervision: Safety issues/verbal cues  FIM - Radio producer Devices: Walker;Elevated toilet seat Toilet Transfers: 4-From toilet/BSC: Min A (steadying Pt. > 75%);4-To toilet/BSC: Min A (steadying Pt. > 75%)  FIM - Bed/Chair Transfer Bed/Chair Transfer Assistive Devices: Arm rests;Walker (Leg lifter) Bed/Chair Transfer: 4: Chair or W/C > Bed: Min A (steadying Pt. > 75%);4: Bed > Chair or W/C: Min A (steadying Pt. > 75%)  FIM - Locomotion: Wheelchair Distance: 50 Locomotion: Wheelchair: 1: Total Assistance/staff pushes wheelchair (Pt<25%) FIM - Locomotion: Ambulation Locomotion: Ambulation Assistive Devices: Research officer, political party) Ambulation/Gait Assistance: 5: Supervision Locomotion: Ambulation: 2: Travels 50 - 149 ft with minimal assistance (Pt.>75%)  Comprehension Comprehension Mode: Auditory Comprehension: 5-Understands complex 90% of the time/Cues < 10% of the time  Expression Expression Mode: Verbal Expression: 5-Expresses basic needs/ideas: With no assist  Social Interaction Social Interaction: 6-Interacts appropriately with others with medication or extra time (anti-anxiety, antidepressant).  Problem Solving Problem Solving: 5-Solves basic 90% of the time/requires cueing < 10% of the time  Memory Memory: 5-Recognizes or recalls 90% of the time/requires cueing < 10% of the time   Medical Problem List and Plan:  1. 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 DVT Prophylaxis/Anticoagulation: Subcutaneous heparin discontinued and chronic Coumadin resumed 11/22/2013 without bridging. . Doppler showed possible Left popliteal DVT in a technically limited study, no progressive symptoms.  INR supratherapeutic 3. Pain Management: Oxycodone immediate release 5 mg every 6 hours as needed. Added kpad for right thigh 4. Mood: Clonazepam 0.5 mg 3 times a day. positive  reinforcement  5. Neuropsych: This patient is capable of making decisions on her own behalf.  6. 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 Shortness of breath today appears to be anxiety related, her RN the patient is due  for some Klonopin 7. Dysphagia. Mechanical soft diet. Gastrostomy tube feeds on hold since admission Should be able to D/C PEG if INR<3 8. Diabetes mellitus. Glipizide will d/c  Checking blood sugars a.c. and at bedtime. Sugars low in am. D/C levemir 5/18 9. Hypertension/atrial fibrillation.Uncontrolled Lasix 40 mg daily, Norvasc 10 mg daily,  Given HR in 80s increase Coreg 14m twice a day  10. Hypothyroidism. Tapazole 5 mg daily. Latest TSH level 2.230  11. Chronic anemia. Last hgb 8.7 monitor, check stool guaic 12. Obstructive sleep apnea. Resumed CPAP.--pt seems to be tolerating well  LOS (Days) [place]  ACharlett Blake5/28/2015, 8:49 AM

## 2013-12-08 NOTE — Consult Note (Signed)
WOC visited this am to assess the pouch.  Intact doing well. Pt discharge plan has been moved up to June 1st. Will follow along with you for continue ostomy support as needed. Shanik Brookshire Wallace RN,CWOCN [phone]

## 2013-12-08 NOTE — Progress Notes (Signed)
ANTICOAGULATION CONSULT NOTE - Follow Up Consult  Pharmacy Consult for coumadin Indication: afib + DVT  Allergies  Allergen Reactions  . Codeine Other (See Comments)    Pass out    . Peanut Butter Flavor Hives and Other (See Comments)    Lockjaw   . Penicillins Swelling    Patient Measurements: Height: 5' 2.99" (160 cm) Weight: 231 lb 11.3 oz (105.1 kg) IBW/kg (Calculated) : 52.38 Heparin Dosing Weight:   Vital Signs: Temp: 98.1 F (36.7 C) (05/28 0632) Temp src: Oral (05/28 0632) BP: 174/84 mmHg (05/28 3546) Pulse Rate: 76 (05/28 0632)  Labs:  Recent Labs  12/06/13 0624 12/07/13 0600 12/08/13 0520  LABPROT 39.3* 36.9* 34.0*  INR 4.26* 3.92* 3.52*  CREATININE 2.29*  --   --     Estimated Creatinine Clearance: 28.4 ml/min (by C-G formula based on Cr of 2.29).   Medications:  Scheduled:  . atorvastatin  10 mg Oral q1800  . carvedilol  12.5 mg Oral BID WC  . clonazePAM  0.5 mg Oral BID  . famotidine  20 mg Oral Daily  . furosemide  40 mg Oral Daily  . lactobacillus  1 g Oral TID WC  . methimazole  5 mg Oral Daily  . nitroGLYCERIN  0.2 mg Transdermal Daily  . potassium chloride  40 mEq Oral Daily  . predniSONE  15 mg Oral Q breakfast  . senna-docusate  1 tablet Oral BID  . sodium bicarbonate  1,300 mg Oral BID  . timolol  1 drop Both Eyes BID  . vitamin C  500 mg Oral BID  . Warfarin - Pharmacist Dosing Inpatient   Does not apply q1800  . zinc sulfate  220 mg Oral Daily   Infusions:    Assessment: 67 up female with afib and hx of DVT is currently on supratherapeutic coumadin.  INR today is 3.52 from 3.92.  Goal of Therapy:  INR 2-3 Monitor platelets by anticoagulation protocol: Yes   Plan:  1) No coumadin tonight 2) INR in am  Tsz-Yin Kamya Watling 12/08/2013,8:25 AM

## 2013-12-08 NOTE — Progress Notes (Signed)
Occupational Therapy Session Note  Patient Details  Name: Sarah Tran MRN: [id number] Date of Birth: 06-08-1946  Today's Date: 12/08/2013 Time: 0800-0900 Time Calculation (min): 60 min  Short Term Goals: Week 1:  OT Short Term Goal 1 (Week 1): Pt will be able to stand at EOB with RW for 1 min with mod A to be able to pull pants over hips. OT Short Term Goal 1 - Progress (Week 1): Met OT Short Term Goal 2 (Week 1): Pt will don shirt with min A. OT Short Term Goal 2 - Progress (Week 1): Met OT Short Term Goal 3 (Week 1): Pt will demonstrate improved BUE shoulder AROM to lift her arms to be able to wash under them without assist. OT Short Term Goal 3 - Progress (Week 1): Met OT Short Term Goal 4 (Week 1): Pt will transfer to drop arm BSC with mod A x1.  OT Short Term Goal 4 - Progress (Week 1): Met Week 2:  OT Short Term Goal 1 (Week 2): Pt will be able to ambulate in and out of bathroom with RW with steady A.  OT Short Term Goal 2 (Week 2): Pt will be able to toilet with mod I.  OT Short Term Goal 3 (Week 2): Pt will be able to dress LB with set up except for shoes and socks. OT Short Term Goal 4 (Week 2): Pt will be able to transfer on and off a tub bench (simulated with clothing on) with min A.  Skilled Therapeutic Interventions/Progress Updates:      Pt seen for BADL retraining of toileting, bathing, and dressing with a focus on activity tolerance, dynamic balance, and functional mobility.  Pt ambulated to toilet to complete bathing and then sat on a shower chair facing the toilet to complete her ostomy care independently.  Pt cleansed feet using a foot soak in the wash bin. She was then able to complete her dressing independently but with extra time needed.  Supervision with all mobility today. Pt resting in wc at end of session.  Therapy Documentation Precautions:  Precautions Precautions: Fall Precaution Comments: Pt with colostomy, PEG, mid abdominal wound site, healing trach site  with small air leak; previous R TKA Restrictions Weight Bearing Restrictions: No    Vital Signs: Therapy Vitals Temp: 98.1 F (36.7 C) Temp src: Oral Pulse Rate: 76 Resp: 19 BP: 174/84 mmHg Patient Position (if appropriate): Lying Oxygen Therapy SpO2: 100 % O2 Device: None (Room air) Pain: Pain Assessment Pain Assessment: No/denies pain ADL: ADL ADL Comments: Refer to FIM  See FIM for current functional status  Therapy/Group: Individual Therapy  Geoffery Lyons Saguier 12/08/2013, 9:00 AM

## 2013-12-08 NOTE — Progress Notes (Signed)
Patient complained of shortness of breath at 1530 . o2 sat at 100% on room air heart rate at 87 bpm . albuteral nebulizer given at 1538 with good relief. Continue with plan of care .            Sarah Tran

## 2013-12-08 NOTE — Progress Notes (Signed)
Occupational Therapy Session Note  Patient Details  Name: Sarah Tran MRN: [id number] Date of Birth: 1945-08-29  Today's Date: 12/08/2013 Time: 1300-1330 Time Calculation (min): 30 min  Short Term Goals: Week 1:  OT Short Term Goal 1 (Week 1): Pt will be able to stand at EOB with RW for 1 min with mod A to be able to pull pants over hips. OT Short Term Goal 1 - Progress (Week 1): Met OT Short Term Goal 2 (Week 1): Pt will don shirt with min A. OT Short Term Goal 2 - Progress (Week 1): Met OT Short Term Goal 3 (Week 1): Pt will demonstrate improved BUE shoulder AROM to lift her arms to be able to wash under them without assist. OT Short Term Goal 3 - Progress (Week 1): Met OT Short Term Goal 4 (Week 1): Pt will transfer to drop arm BSC with mod A x1.  OT Short Term Goal 4 - Progress (Week 1): Met  Week 2:  OT Short Term Goal 1 (Week 2): Pt will be able to ambulate in and out of bathroom with RW with steady A.  OT Short Term Goal 2 (Week 2): Pt will be able to toilet with mod I.  OT Short Term Goal 3 (Week 2): Pt will be able to dress LB with set up except for shoes and socks. OT Short Term Goal 4 (Week 2): Pt will be able to transfer on and off a tub bench (simulated with clothing on) with min A.  Skilled Therapeutic Interventions/Progress Updates:  Patient received seated in w/c. Focused skilled intervention on edema management. Measured patient's bilateral ankles and calves - Left: Ankle=31.5 cm, Calf=57cm and Right: Ankle=32cm, Calf=53.5cm. Patient's daughter present and reports that patient has been dealing with edema for "a long time". No pitting edema noted in BUEs OR BLEs. Recommend patient perform BUE & BLEs exercises throughout day and elevating extremities as much as possible, therapist educated patient & daughter on exercises. Also recommending compression stockings > BLEs. Patient's daughter states she plans to go to a medical supply store today and give them patient's calf & ankle  measurements for compression stockings. Therapist discussed with patient and daughter recommendation of donning stockings during the day and doffing at night. At end of session, left patient seated in w/c beside bed with all needs within reach.   Recommend additional follow-up lymphedema management through home health or outpatient venue, SW aware of recommendation.  Precautions:  Precautions Precautions: Fall Precaution Comments: Pt with colostomy, PEG, mid abdominal wound site, healing trach site with small air leak; previous R TKA Restrictions Weight Bearing Restrictions: No  See FIM for current functional status  Therapy/Group: Individual Therapy  Estelle June, MS, OTR/L, CLT 12/08/2013, 1:52 PM

## 2013-12-08 NOTE — Progress Notes (Signed)
Speech Language Pathology Daily Session Note  Patient Details  Name: Sarah Tran MRN: [id number] Date of Birth: [birth year]  Today's Date: 12/08/2013 Time: 1350-1430 Time Calculation (min): 40 min  Short Term Goals: Week 3: SLP Short Term Goal 1 (Week 3): Pt will demonstrate basic problem solving during familiar, functional tasks with min cues SLP Short Term Goal 2 (Week 3): Pt will verbalize at least at least 1 physical and 2 cognitive impairments as well as their impact on pt's independence in her home environment with min cues.  SLP Short Term Goal 3 (Week 3): Pt will utilize external memory aids to facilitate recall of new and daily information with supervision.   SLP Short Term Goal 4 (Week 3): Pt will improve alternating/divided attention to functional task for 15-20 minutes with Min cues  Skilled Therapeutic Interventions: Pt was seen for skilled speech therapy targeting executive function for numerical reasoning.  Pt was 66% accurate for numerical reasoning during functional math calculations with mod assist, improving to 100% accuracy with max assist for organization, working memory and error awareness.  Furthermore, pt required mod cuing for 60% accuracy when balancing a checkbook, improving to 100% accuracy with max cuing.  SLP reviewed and reinforced recommendations for assistance for financial management upon discharge given that pt was very independent when paying her bills at baseline.     FIM:  Comprehension Comprehension Mode: Auditory Comprehension: 5-Understands complex 90% of the time/Cues < 10% of the time Expression Expression Mode: Verbal Expression: 5-Expresses complex 90% of the time/cues < 10% of the time Social Interaction Social Interaction: 6-Interacts appropriately with others with medication or extra time (anti-anxiety, antidepressant). Problem Solving Problem Solving: 5-Solves basic 90% of the time/requires cueing < 10% of the time Memory Memory:  5-Recognizes or recalls 90% of the time/requires cueing < 10% of the time FIM - Eating Eating Activity: 7: Complete independence:no helper  Pain Pain Assessment Pain Assessment: No/denies pain  Therapy/Group: Individual Therapy  Jackalyn Lombard, M.A. CCC-SLP  [name] 12/08/2013, 5:42 PM

## 2013-12-08 NOTE — Progress Notes (Signed)
Physical Therapy Session Note  Patient Details  Name: Sarah Tran MRN: [id number] Date of Birth: 08-Jan-1946  Today's Date: 12/08/2013 Time: 0905-1000 Time Calculation (min): 55 min  Short Term Goals: = LTGs due to LOS  Skilled Therapeutic Interventions/Progress Updates:   Pt grooming in w/c in room, brakes not locked.  Discussed safety concerns of unlocked brakes.  Gait in room with bari RW to toilet.  Standing balance during clothing mgt with bil hands, several times without LOB.  Standing at sink, leaning against sink due to fatigue for hand washing with supervision, cues for use of pump soap.  Gait with bari RW x 140' with close supervision, min guard assist for turn to L, required 2 standing rest breaks.  Simulated car transfer to sedan ht seat with min assist for bil LEs in/out of car, using RW with min guard during turns. Demo and discussion needed for pt to understand optimal position of car seat.    Self stretching R hamstrings and heel cords x 30 seconds x 3, in sitting, using stool and strap, with mod cues for technique after set-up.  Given 2 choices, pt able to choose correct answer regarding use of w/c brakes when doing grooming in room, and car seat position for car transfer, at end of session. Therapy Documentation Precautions:  Precautions Precautions: Fall Precaution Comments: Pt with colostomy, PEG, mid abdominal wound site, healing trach site with small air leak; previous R TKA Restrictions Weight Bearing Restrictions: No   Vital Signs: Therapy Vitals Pulse Rate: 80 BP: 150/80 mmHg Pain: Pain Assessment Pain Assessment: No/denies pain   See FIM for current functional status  Therapy/Group: Individual Therapy  Susa Loffler 12/08/2013, 11:33 AM

## 2013-12-09 ENCOUNTER — Inpatient Hospital Stay (HOSPITAL_COMMUNITY): Payer: Medicare Other | Admitting: Speech Pathology

## 2013-12-09 ENCOUNTER — Inpatient Hospital Stay (HOSPITAL_COMMUNITY): Payer: Medicare Other | Admitting: Occupational Therapy

## 2013-12-09 ENCOUNTER — Inpatient Hospital Stay (HOSPITAL_COMMUNITY): Payer: Medicare Other | Admitting: *Deleted

## 2013-12-09 ENCOUNTER — Encounter (HOSPITAL_COMMUNITY): Payer: Self-pay | Admitting: Physician Assistant

## 2013-12-09 DIAGNOSIS — N289 Disorder of kidney and ureter, unspecified: Secondary | ICD-10-CM

## 2013-12-09 DIAGNOSIS — E119 Type 2 diabetes mellitus without complications: Secondary | ICD-10-CM

## 2013-12-09 DIAGNOSIS — R5381 Other malaise: Secondary | ICD-10-CM

## 2013-12-09 DIAGNOSIS — D638 Anemia in other chronic diseases classified elsewhere: Secondary | ICD-10-CM

## 2013-12-09 DIAGNOSIS — J9620 Acute and chronic respiratory failure, unspecified whether with hypoxia or hypercapnia: Secondary | ICD-10-CM

## 2013-12-09 DIAGNOSIS — K9420 Gastrostomy complication, unspecified: Secondary | ICD-10-CM

## 2013-12-09 LAB — CBC
HEMATOCRIT: 22.2 % — AB (ref 36.0–46.0)
HEMOGLOBIN: 7.2 g/dL — AB (ref 12.0–15.0)
MCH: 30 pg (ref 26.0–34.0)
MCHC: 32.4 g/dL (ref 30.0–36.0)
MCV: 92.5 fL (ref 78.0–100.0)
Platelets: 173 10*3/uL (ref 150–400)
RBC: 2.4 MIL/uL — ABNORMAL LOW (ref 3.87–5.11)
RDW: 17.6 % — ABNORMAL HIGH (ref 11.5–15.5)
WBC: 4.8 10*3/uL (ref 4.0–10.5)

## 2013-12-09 LAB — GLUCOSE, CAPILLARY
Glucose-Capillary: 81 mg/dL (ref 70–99)
Glucose-Capillary: 154 mg/dL — ABNORMAL HIGH (ref 70–99)
Glucose-Capillary: 144 mg/dL — ABNORMAL HIGH (ref 70–99)
Glucose-Capillary: 136 mg/dL — ABNORMAL HIGH (ref 70–99)

## 2013-12-09 LAB — CBC WITH DIFFERENTIAL/PLATELET
BASOS ABS: 0 10*3/uL (ref 0.0–0.1)
Basophils Relative: 0 % (ref 0–1)
Eosinophils Absolute: 0.1 10*3/uL (ref 0.0–0.7)
Eosinophils Relative: 2 % (ref 0–5)
HEMATOCRIT: 22.4 % — AB (ref 36.0–46.0)
HEMOGLOBIN: 7.1 g/dL — AB (ref 12.0–15.0)
LYMPHS PCT: 22 % (ref 12–46)
Lymphs Abs: 1.1 10*3/uL (ref 0.7–4.0)
MCH: 29.3 pg (ref 26.0–34.0)
MCHC: 31.7 g/dL (ref 30.0–36.0)
MCV: 92.6 fL (ref 78.0–100.0)
MONO ABS: 0.3 10*3/uL (ref 0.1–1.0)
Monocytes Relative: 7 % (ref 3–12)
NEUTROS PCT: 69 % (ref 43–77)
Neutro Abs: 3.4 10*3/uL (ref 1.7–7.7)
Platelets: 171 10*3/uL (ref 150–400)
RBC: 2.42 MIL/uL — ABNORMAL LOW (ref 3.87–5.11)
RDW: 17.7 % — ABNORMAL HIGH (ref 11.5–15.5)
WBC: 4.9 10*3/uL (ref 4.0–10.5)

## 2013-12-09 LAB — PROTIME-INR
INR: 2.89 — ABNORMAL HIGH (ref 0.00–1.49)
Prothrombin Time: 29.2 seconds — ABNORMAL HIGH (ref 11.6–15.2)

## 2013-12-09 MED ORDER — PANTOPRAZOLE SODIUM 40 MG PO TBEC: 40.0000 mg | DELAYED_RELEASE_TABLET | Freq: Every day | ORAL | Status: DC

## 2013-12-09 MED ORDER — PANTOPRAZOLE SODIUM 40 MG PO TBEC
40.0000 mg | DELAYED_RELEASE_TABLET | Freq: Every day | ORAL | Status: DC
  Administered 2013-12-09 – 2013-12-12 (×4): 40 mg via ORAL
  Filled 2013-12-09 (×4): qty 1

## 2013-12-09 MED ORDER — WARFARIN SODIUM 5 MG PO TABS
5.0000 mg | ORAL_TABLET | Freq: Once | ORAL | Status: AC
  Administered 2013-12-09: 5 mg via ORAL
  Filled 2013-12-09: qty 1

## 2013-12-09 NOTE — Progress Notes (Signed)
Noted hemoglobin this a.m. of 7.1. Noted was some G-tube drainage. Hemoglobin has been variable 8.7-9.4. Coumadin has been supratherapeutic. Awaiting plan to remove G-tube once INR decreased. In light of hemoglobin 7.1 last GI consult question need for EGD question anastomotic leak or.

## 2013-12-09 NOTE — Progress Notes (Signed)
Physical Therapy Session Note  Patient Details  Name: Sarah Tran MRN: [id number] Date of Birth: 12-01-1945  Today's Date: 12/09/2013 Time:  8:05-9:00 ( ) and 14:55-15:35 ( )   Skilled Therapeutic Interventions/Progress Updates:  First tx focused on balance, family training, gait with standard RW, ramp, and functional mobility in apartment.  Pt able to demo dynamic sitting and standing balance with S and appropriate UE support prn.  Pt demo day-to-day recall with S/Mod I. She was performing self cares in [facility] when therapist entered with WC brakes on as instructed yestreday.   Performed toilet transfer and navigation in room with RW and S. Pt able to stand x9min during room mobility with S.  Without seated rest, pt them performed gait training x190' with RW and S. Brother performing appropriate guarding and cues for increased step width and posture. Pt only needed 1 standing rest.   Performed navigation in apartment with S and RW, including bed mobility with leg loop and S cues only for efficiency.  Pt had one instance of needing brother to provide min A from low level due to fatigue.  Discussed equipment with pt/brother, and also trialed standard wide RW for gait and ramp with safe mobility noted.   Pt able to perform gait on ramp with S, and brother able to propel WC on ramp with S following demo and cues for safe technique.    ---------------------------  Second tx focused on therex and functional mobility family traiing. Brother and daughter present and concerned about PEG tube and ongoing LE edema, often hard at night. Reinforced importance of elevation, especially in supine to relieve pressure. Also encouraged elevating leg rests if up in chair. Daughter has ordered compression hose from distributor.  Pt very SOB this afternoon 4/5, but wanting to wait until after tx for breathing tx.  Brother continues to demonstrate safe transfers with only min cues for optimal positioning,  especially when pt tells him to move away. Pt educated on importance of safety and fall precautions.   Provided HEP handout and instructed pt in [place] exercises for strength and falls precautions. She was able to complete sitting and standing ex with 2 rest breaks and cues for technique.   Pt left up in [facility] with nurse coming for breathing tx, LEs elevated.      Therapy Documentation Precautions:  Precautions Precautions: Fall Precaution Comments: Pt with colostomy, PEG, mid abdominal wound site, healing trach site with small air leak; previous R TKA Restrictions Weight Bearing Restrictions: No General:   Vital Signs: Therapy Vitals Temp: 97.4 F (36.3 C) Temp src: Oral Pulse Rate: 81 Resp: 18 BP: 186/100 mmHg Patient Position (if appropriate): Lying Oxygen Therapy SpO2: 100 % O2 Device: None (Room air) Pain: none    See FIM for current functional status  Therapy/Group: Individual Therapy Clydene Laming, PT, DPT   12/09/2013, 8:22 AM

## 2013-12-09 NOTE — Progress Notes (Signed)
Pt. Peg tube leaking brown secretions at 0558 and 0650 . Site intact and clean, applied new split gauze and dry dressing x2. Informed Dr. Wynn Banker he will visit patient. Britta Mccreedy., RN

## 2013-12-09 NOTE — Progress Notes (Signed)
Subjective/Complaints: 68 year old right-handed morbidly obese African American female. Past medical history of hypertension, renal insufficiency, obstructive sleep apnea with CPAP, DVT/A. fib with chronic Coumadin. Hospital course patient admitted to [facility] 09/28/2013 for removal of a ileal polyp. Noted perioperative complications balloon catheter became dislodged. She required exploratory laparotomy and diverting colostomy as well as appendectomy secondary to perforated sigmoid colon. Her hospital was complicated by respiratory failure requiring intubation and eventually tracheostomy as well his gastrostomy tube for nutritional support. . 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   RN reports brownish drainage, pt without abd pain, no blood noted O2 sats 98% on room air  Review of Systems - otherwise negative Objective: Vital Signs: Blood pressure 164/81, pulse 83, temperature 97.4 F (36.3 C), temperature source Oral, resp. rate 18, height 5' 2.99" (1.6 m), weight 105.1 kg (231 lb 11.3 oz), SpO2 100.00%. No results found. Results for orders placed during the hospital encounter of 11/22/13 (from the past 72 hour(s))  GLUCOSE, CAPILLARY     Status: None   Collection Time    12/06/13  7:11 AM      Result Value Ref Range   Glucose-Capillary 71  70 - 99 mg/dL   Comment 1 Notify RN    GLUCOSE, CAPILLARY     Status: None   Collection Time    12/06/13 12:28 PM      Result Value Ref Range   Glucose-Capillary 95  70 - 99 mg/dL   Comment 1 Notify RN    GLUCOSE, CAPILLARY     Status: Abnormal   Collection Time    12/06/13  4:22 PM      Result Value Ref Range   Glucose-Capillary 233 (*) 70 - 99 mg/dL   Comment 1 Notify RN    OCCULT BLOOD X 1 CARD TO LAB, STOOL     Status: Abnormal   Collection Time    12/06/13  7:33 PM      Result Value Ref Range   Fecal  Occult Bld POSITIVE (*) NEGATIVE  GLUCOSE, CAPILLARY     Status: Abnormal   Collection Time    12/06/13  9:08 PM      Result Value Ref Range   Glucose-Capillary 163 (*) 70 - 99 mg/dL  PROTIME-INR     Status: Abnormal   Collection Time    12/07/13  6:00 AM      Result Value Ref Range   Prothrombin Time 36.9 (*) 11.6 - 15.2 seconds   INR 3.92 (*) 0.00 - 1.49  GLUCOSE, CAPILLARY     Status: None   Collection Time    12/07/13  7:14 AM      Result Value Ref Range   Glucose-Capillary 87  70 - 99 mg/dL   Comment 1 Notify RN    GLUCOSE, CAPILLARY     Status: Abnormal   Collection Time    12/07/13 12:00 PM      Result Value Ref Range   Glucose-Capillary 112 (*) 70 - 99 mg/dL   Comment 1 Notify RN    GLUCOSE, CAPILLARY     Status: Abnormal   Collection Time    12/07/13  4:42 PM      Result Value Ref Range   Glucose-Capillary 193 (*) 70 - 99 mg/dL   Comment 1 Notify RN    GLUCOSE, CAPILLARY     Status: Abnormal   Collection Time  12/07/13  9:06 PM      Result Value Ref Range   Glucose-Capillary 130 (*) 70 - 99 mg/dL  PROTIME-INR     Status: Abnormal   Collection Time    12/08/13  5:20 AM      Result Value Ref Range   Prothrombin Time 34.0 (*) 11.6 - 15.2 seconds   INR 3.52 (*) 0.00 - 1.49  GLUCOSE, CAPILLARY     Status: None   Collection Time    12/08/13  7:20 AM      Result Value Ref Range   Glucose-Capillary 84  70 - 99 mg/dL   Comment 1 Notify RN    GLUCOSE, CAPILLARY     Status: Abnormal   Collection Time    12/08/13 12:02 PM      Result Value Ref Range   Glucose-Capillary 130 (*) 70 - 99 mg/dL   Comment 1 Notify RN    GLUCOSE, CAPILLARY     Status: Abnormal   Collection Time    12/08/13  4:33 PM      Result Value Ref Range   Glucose-Capillary 208 (*) 70 - 99 mg/dL   Comment 1 Notify RN    GLUCOSE, CAPILLARY     Status: None   Collection Time    12/08/13  4:38 PM      Result Value Ref Range   Glucose-Capillary 97  70 - 99 mg/dL   Comment 1 Notify RN     GLUCOSE, CAPILLARY     Status: Abnormal   Collection Time    12/08/13  6:20 PM      Result Value Ref Range   Glucose-Capillary 116 (*) 70 - 99 mg/dL   Comment 1 Notify RN    GLUCOSE, CAPILLARY     Status: Abnormal   Collection Time    12/08/13  9:26 PM      Result Value Ref Range   Glucose-Capillary 215 (*) 70 - 99 mg/dL     HEENT: healing trach site with air leak General: No acute distress Mood and affect are appropriate Heart: Regular rate and rhythm no rubs murmurs or extra sounds Lungs: Clear to auscultation, breathing unlabored, no rales or wheezes Abdomen: Positive bowel sounds, soft nontender to palpation, nondistended. Extremities: No clubbing, cyanosis, or edema, G tube site healed Skin: No evidence of breakdown, no evidence of rash Neurologic: Cranial nerves II through XII intact, motor strength is 3-/5 in bilateral deltoid, 4/5 bicep, tricep, grip, 3-/5 R hip and Knee flexor,4- knee extensors,3- Left Hip flexor  ankle dorsiflexor and plantar flexor    Musculoskeletal: Tenderness mild right Achilles  Assessment/Plan: 1. Functional deficits secondary to Critical illness myopathy,  encephalopathy which require 3+ hours per day of interdisciplinary therapy in a comprehensive inpatient rehab setting. Physiatrist is providing close team supervision and 24 hour management of active medical problems listed below. Physiatrist and rehab team continue to assess barriers to discharge/monitor patient progress toward functional and medical goals.  FIM: FIM - Bathing Bathing Steps Patient Completed: Chest;Right Arm;Left Arm;Abdomen;Right upper leg;Left upper leg;Front perineal area;Buttocks;Right lower leg (including foot);Left lower leg (including foot) Bathing: 5: Supervision: Safety issues/verbal cues  FIM - Upper Body Dressing/Undressing Upper body dressing/undressing steps patient completed: Thread/unthread right sleeve of pullover shirt/dresss;Thread/unthread left sleeve of  pullover shirt/dress;Put head through opening of pull over shirt/dress;Pull shirt over trunk Upper body dressing/undressing: 5: Supervision: Safety issues/verbal cues FIM - Lower Body Dressing/Undressing Lower body dressing/undressing steps patient completed: Thread/unthread right underwear leg;Thread/unthread left underwear leg;Thread/unthread  right pants leg;Thread/unthread left pants leg;Pull pants up/down;Pull underwear up/down;Don/Doff left sock;Don/Doff right sock;Don/Doff left shoe;Fasten/unfasten right shoe;Fasten/unfasten left shoe;Don/Doff right shoe Lower body dressing/undressing: 5: Supervision: Safety issues/verbal cues  FIM - Toileting Toileting steps completed by patient: Adjust clothing prior to toileting;Performs perineal hygiene;Adjust clothing after toileting Toileting Assistive Devices: Grab bar or rail for support Toileting: 5: Supervision: Safety issues/verbal cues  FIM - Diplomatic Services operational officer Devices: Art gallery manager Transfers: 5-To toilet/BSC: Supervision (verbal cues/safety issues);5-From toilet/BSC: Supervision (verbal cues/safety issues)  FIM - Bed/Chair Transfer Bed/Chair Transfer Assistive Devices: Therapist, occupational: 5: Sit > Supine: Supervision (verbal cues/safety issues);5: Supine > Sit: Supervision (verbal cues/safety issues);5: Bed > Chair or W/C: Supervision (verbal cues/safety issues);5: Chair or W/C > Bed: Supervision (verbal cues/safety issues)  FIM - Locomotion: Wheelchair Distance: 50 Locomotion: Wheelchair: 1: Total Assistance/staff pushes wheelchair (Pt<25%) FIM - Locomotion: Ambulation Locomotion: Ambulation Assistive Devices: Investment banker, corporate) Ambulation/Gait Assistance: 5: Supervision Locomotion: Ambulation: 2: Travels 50 - 149 ft with minimal assistance (Pt.>75%)  Comprehension Comprehension Mode: Auditory Comprehension: 5-Understands complex 90% of the time/Cues < 10% of the time  Expression Expression  Mode: Verbal Expression: 5-Expresses basic 90% of the time/requires cueing < 10% of the time.  Social Interaction Social Interaction: 6-Interacts appropriately with others with medication or extra time (anti-anxiety, antidepressant).  Problem Solving Problem Solving: 5-Solves basic 90% of the time/requires cueing < 10% of the time  Memory Memory: 5-Recognizes or recalls 90% of the time/requires cueing < 10% of the time   Medical Problem List and Plan:  1. 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 DVT Prophylaxis/Anticoagulation: Subcutaneous heparin discontinued and chronic Coumadin resumed 11/22/2013 without bridging. . Doppler showed possible Left popliteal DVT in a technically limited study, no progressive symptoms.  INR supratherapeutic 3. Pain Management: Oxycodone immediate release 5 mg every 6 hours as needed. Added kpad for right thigh 4. Mood: Clonazepam 0.5 mg 3 times a day. positive reinforcement  5. Neuropsych: This patient is capable of making decisions on her own behalf.  6. 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 Shortness of breath today appears to be anxiety related, her RN the patient is due for some Klonopin 7. Dysphagia. Mechanical soft diet. Gastrostomy tube feeds on hold since admission Should be able to D/C PEG if INR<3 8. Diabetes mellitus. Glipizide will d/c  Checking blood sugars a.c. and at bedtime. Sugars low in am. D/C levemir 5/18 9. Hypertension/atrial fibrillation.Uncontrolled Lasix 40 mg daily, Norvasc 10 mg daily,  Given HR in 80s increase Coreg 25mg  twice a day  10. Hypothyroidism. Tapazole 5 mg daily. Latest TSH level 2.230  11. Chronic anemia. Last hgb 8.7 monitor, check stool guaic 12. Obstructive sleep apnea. Resumed CPAP.--pt seems to be tolerating well 13.  Pt with G tube drainage, no clinical signs of infection, stool OB + INR elevated, may be slow gastric bleed, recheck CBC,  pharmacy to adjust INR to 2-3 range,  LOS (Days) 17 A FACE TO FACE EVALUATION WAS PERFORMED  Erick Colace 12/09/2013, 7:08 AM

## 2013-12-09 NOTE — Consult Note (Signed)
[facility] Consult: 12:28 PM 12/09/2013  LOS: 17 days    Referring Provider: Dr. Wynn Banker  Primary Care Physician:  Dr Kirt Boys internist and Nephrologist. In [place].  Gastroenterologist:  Ernest Pine, [place].    Reason for Consultation:  Anemia, bloody discharge from G tube site.    HPI: Sarah Tran is a 68 y.o.  Morbidly obese African American female. PMH: HTN, stage 3 CKD, OSA CPAP.  Hx DVT/A fib on chronic Coumadin. Hyperthyroidism on Tapazole (TSH 4.2).   Pt underwent capsule endoscopy earlier this year for unclear reasons, findings of ileal polyp.  Admitted to [facility] 09/28/2013 for "lower device assisted enteroscopy with removal of the ileal polyp".  Procedural complications of dislodged balloon catheter ("migration of overtube requiring removal") and free air required exploratory laparotomy with appy, diverting colostomy for sigmoid perf.  Developed sepsis, wound dehiscence, ?angioedema from Invanz reaction, resp failure requiring intubation and eventually tracheostomy and PEG tube.  In  ICU for 28 days.   Transferred to [facility] 10/25/2013 for vent weaning and wound care. Hospital course complicated by acute renal failure, A. 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  Patient's tracheostomy tube was downsized and she was decannulated 11/18/2013.  Wound vac has been removed.  Transferred to [facility] unit 5/12 to further address deconditioning.   Has had chronic anemia.  She normally receives Procrit (supervised by her PMD) monthly.  Has received periodic parenteral iron (last was in 2013).   PRBC transfusions about 4 years ago, again at [facility] and at [facility]. Hgb ranges 8.4 - 8.9. Up to 9.4 on 5/15. 8.7 on 5/18.  Staff  had noticed slightly bloody, dark coffee like discharge from the PEG site vs the PEG itself.  Hgb rechecked today, 5/29 and it is 7.1.  RN  Today flushed the PEG and no blood or dark material emerged.  Stool is FOBT + but brown and loose. She has been on 20 mg Pepcid.  BUN and Creatinine trending up (creat more so than BUN) since late 10/2013, but overall on downward path in last 2 weeks.  INR of 4.26 on 5/26. Has drifted to 2.9 today without Vitamin K or FFP.  Last dose of Coumadin was on 5/23.   Staff on rehab was going to remove the PEG as she is eating well and has not required use of this for po's in ~ 2 weeks.  She is on tapering dose of Prednisone for resp issues.   A CT was obtained 5/18 to look for retroperitoneal hematoma.  Study was negative.  Prior CT of 10/28/13 showed expected post surgical changes,  Moderate HH.      Past Medical History  Diagnosis Date  . Atrial fibrillation     chronic Coumadin  . Anemia of chronic disease   . HTN (hypertension)   . Morbid obesity   . Diabetes mellitus, type II   . Hyperthyroidism   . Hyperlipidemia   . CKD (chronic kidney disease), stage III   . DVT (  deep venous thrombosis)     chronic Coumadin  . OSA on CPAP     Past Surgical History  Procedure Laterality Date  . Exploratory laparotomy  09/2013    appy, diverting colostomy for sigmoid perf following removal of ileal polyp.   . Gastrostomy tube placement  10/2013  . Tracheostomy  10/2013    [facility]. Trach decannulated 11/18/2013 at [facility].    Prior to Admission medications   Medication Sig Start Date End Date Taking? Authorizing Provider  amLODipine (NORVASC) 10 MG tablet Take 10 mg by mouth daily.    Historical Provider, MD  aspirin EC 81 MG tablet Take 81 mg by mouth daily.    Historical Provider, MD  atorvastatin (LIPITOR) 20 MG tablet Take 20 mg by mouth daily.    Historical Provider, MD  brimonidine-timolol (COMBIGAN) 0.2-0.5 % ophthalmic solution Place 1 drop  into both eyes every 12 (twelve) hours.    Historical Provider, MD  calcitRIOL (ROCALTROL) 0.25 MCG capsule Take 0.25 mcg by mouth daily.    Historical Provider, MD  cholecalciferol (VITAMIN D) 400 UNITS TABS tablet Take 400 Units by mouth daily.    Historical Provider, MD  colchicine 0.6 MG tablet Take 0.6 mg by mouth daily.    Historical Provider, MD  fluticasone (CUTIVATE) 0.05 % cream Apply 1 application topically 2 (two) times daily.    Historical Provider, MD  furosemide (LASIX) 40 MG tablet Take 40 mg by mouth daily.  09/13/13   Historical Provider, MD  IRON PO Take 1 tablet by mouth daily.    Historical Provider, MD  labetalol (NORMODYNE) 200 MG tablet Take 200 mg by mouth 2 (two) times daily.  09/22/13   Historical Provider, MD  methimazole (TAPAZOLE) 5 MG tablet Take 5 mg by mouth daily.  09/26/13   Historical Provider, MD  potassium chloride (KLOR-CON) 20 MEQ packet Take 20 mEq by mouth daily.    Historical Provider, MD  warfarin (COUMADIN) 5 MG tablet Take 5 mg by mouth daily.  09/19/13   Historical Provider, MD    Scheduled Meds: . atorvastatin  10 mg Oral q1800  . carvedilol  25 mg Oral BID WC  . clonazePAM  0.5 mg Oral BID  . famotidine  20 mg Oral Daily  . furosemide  40 mg Oral Daily  . lactobacillus  1 g Oral TID WC  . methimazole  5 mg Oral Daily  . nitroGLYCERIN  0.2 mg Transdermal Daily  . potassium chloride  40 mEq Oral Daily  . predniSONE  15 mg Oral Q breakfast  . senna-docusate  1 tablet Oral BID  . sodium bicarbonate  1,300 mg Oral BID  . timolol  1 drop Both Eyes BID  . vitamin C  500 mg Oral BID  . warfarin  5 mg Oral ONCE-1800  . Warfarin - Pharmacist Dosing Inpatient   Does not apply q1800  . zinc sulfate  220 mg Oral Daily   Infusions:   PRN Meds: acetaminophen, albuterol, dextromethorphan-guaiFENesin, ondansetron (ZOFRAN) IV, ondansetron, sorbitol   Allergies as of 11/22/2013 - Review Complete 11/22/2013  Allergen Reaction Noted  . Codeine Other (See  Comments) 11/22/2013  . Peanut butter flavor Hives and Other (See Comments) 11/21/2013  . Penicillins Swelling 11/21/2013    No family history on file.  History   Social History  . Marital Status: Divorced    Spouse Name: N/A    Number of Children: N/A  . Years of Education: N/A  Occupational History  . retired   Social History Main Topics  . Smoking status: Not on file  . Smokeless tobacco: Not on file  . Alcohol Use: Not on file  . Drug Use: Not on file  . Sexual Activity: Not on file   Other Topics Concern  . Not on file   Social History Narrative  . Lives with Daughter.     REVIEW OF SYSTEMS: Constitutional:  [facility].  Getting stronger but not at baseline ENT:  No nose bleeds Pulm:  Overall breathing is better but stridor persists.  When anxious, the dyspnea is worse.  CV:  + palpitations, + LE edema.  GU:  No hematuria, no frequency GI:  + constipation. Heme:  Per HPI    Transfusions:  Per HPI MS:  + joint pains and myalgias.  Neuro:  No headaches, no peripheral tingling or numbness Derm:  No itching, no rash or sores.  Endocrine:  No sweats or chills.  No polyuria or dysuria Immunization:  Not queried.  Travel:  None beyond local counties in last few months.    PHYSICAL EXAM: Vital signs in last 24 hours: Filed Vitals:   12/09/13 0743  BP: 186/100  Pulse: 81  Temp:   Resp:    Wt Readings from Last 3 Encounters:  12/01/13 105.1 kg (231 lb 11.3 oz)  11/21/13 113.853 kg (251 lb)   General: somewhat ill looking but non-toxic obese AAF.  Comfortable, alert Head:  No swelling or asymmetray  Eyes:  No icterus, + pallor of conjunctiva Ears:  Not HOH  Nose:  No discharge.  No sneexing Mouth:  Moist, MM.  Good dentition Neck:  No mass, no JVD Lungs:  Clear. Upper resp stridor audible with breathing.  No cough.  + dyspnea Heart: RRR.  No MRG Abdomen:  Soft, obese.  PEG site looks benign.  Ample "wiggle" in PEG (not to  tight).  Brown, liquid stool in ostomy.  No blood seen at PEG or in ostomy.  15 cm area of shallow wound dehiscence at mid line incision.  Injection site hematomas in lower abdomen bil.  Rectal: deferred   Musc/Skeltl: no moint deformity or swelling Extremities:  Pedal edema, non-pitting.  Neurologic:  Oriented x 3.  No limb weakness.  No confusion.  No tremor Skin:  No rash or sores except at abdomen Tattoos:  none Nodes:  No cervical adenopathy.    Psych:  Pleasant, relaxed.   Intake/Output from previous day: 05/28 0701 - 05/29 0700 In: 840 [P.O.:840] Out: -  Intake/Output this shift: Total I/O In: 390 [P.O.:360; Other:30] Out: -   LAB RESULTS:  Recent Labs  12/09/13 0919  WBC 4.9  HGB 7.1*  HCT 22.4*  PLT 171   BMET Lab Results  Component Value Date   NA 140 12/06/2013   NA 140 11/23/2013   NA 141 11/22/2013   K 3.8 12/06/2013   K 4.6 11/23/2013   K 4.6 11/22/2013   CL 104 12/06/2013   CL 100 11/23/2013   CL 101 11/22/2013   CO2 22 12/06/2013   CO2 27 11/23/2013   CO2 26 11/22/2013   GLUCOSE 76 12/06/2013   GLUCOSE 68* 11/23/2013   GLUCOSE 67* 11/22/2013   BUN 46* 12/06/2013   BUN 69* 11/23/2013   BUN 72* 11/22/2013   CREATININE 2.29* 12/06/2013   CREATININE 3.16* 11/23/2013   CREATININE 3.05* 11/22/2013   CALCIUM 8.9 12/06/2013   CALCIUM 9.8 11/23/2013  CALCIUM 9.5 11/22/2013   LFT No results found for this basename: PROT, ALBUMIN, AST, ALT, ALKPHOS, BILITOT, BILIDIR, IBILI,  in the last 72 hours PT/INR Lab Results  Component Value Date   INR 2.89* 12/09/2013   INR 3.52* 12/08/2013   INR 3.92* 12/07/2013     RADIOLOGY STUDIES:     11/28/13 CT abdomen pelvis  COMPARISON: 10/28/2013 CT.  FINDINGS:  No evidence of retroperitoneal hematoma.  Post sigmoid resection with left lower abdominal/pelvic colostomy.  Colonic diverticula without extra luminal bowel inflammatory  process, free fluid or free air.  Anterior abdominal/pelvic wall incision healing by secondary  intent.  Nodularity along the inferior aspect of the incision site without  drainable abscess identified.  Gastrostomy tube is in place without complication noted. Prominent  hiatal hernia with slight impression upon the heart. Coronary artery  calcifications suspected.  No abdominal aortic aneurysm. Atherosclerotic type changes iliac  arteries with mild ectasia.  Right adrenal lesion suggestive of adenoma unchanged. Bilateral  renal lesions larger ones which are cysts and others are too small  to adequately characterize without change. Taking into account  limitation by non contrast imaging, no worrisome hepatic, splenic,  pancreatic or left adrenal lesion.  Bilateral hip joint degenerative changes greater right with  subchondral cyst formation.  Degenerative changes thoracic and lumbar spine most notable lower  lumbar spine.  Slight improvement in aeration of lung bases with subsegmental  atelectasis remaining. Difficult to completely exclude tiny nodule  left lung base although suspect this represents the presence of  atelectasis. Limited CT through the lung bases can be performed in 3  - 6 months to confirm this impression.  No adenopathy.  IMPRESSION:  No evidence of retroperitoneal hematoma. Please see above  discussion.     ENDOSCOPIC STUDIES: Per HPI.    IMPRESSION:   * Minor bleeding around the PEG insertion site.  In pt with supratherapeutic INR a few days ago.  Now in therapeutic range..  *  Dysphagia, resolved.  Pt and rehab were looking forward to removing PEG prior to her discharge home on 6/2.    *  Resp failure, trach removed  On chronic Prednisone taper.   *  Acute on chronic anemia.  Has not been getting Procrit per home routine since admissions started in April   *  Protein malnutrition, improved.   *  Ileal polyp of unkown pathology.  Attempted colonoscopic, balloon removal resulting in dislodged balloon and sigmoid perforation.  Lead to exploratory  laparotomy with appy, diverting colostomy    *  Stage 3 CKD, acutely worse.    PLAN:     *  I discontinued the order for another CT scan, this is unlikely to provide additional information.  Repeat the CBC this afternoon and in the AM. *  Stopped the Pepcid and started once daily po Protonix.  *  Consider pharmacy consult for reinitiation of Procrit.  *  Would not remove PEG by traction until the INR is near normal or normal (less than 3 is not sufficient, it should be normal) *  No indication for EGD unless evidence of large volume bleed.   *  Transfuse PRBCs PRN.     Dianah Field  12/09/2013, 12:28 PM Pager: [phone]  GI ATTENDING  History, laboratories, x-rays reviewed. Patient personally seen and examined. Agree with H&P as outlined above. Very complicated case as outlined. Asked to see regarding bleeding around the PEG tube site and drifting hemoglobin. It appears that the  bleeding around the PEG tube site is quite minor. Almost certainly due to local trauma, which is not uncommon. No evidence for true luminal GI bleeding (with negative NG lavage and brown stools). Drifting hemoglobin multifactorial. Previous CT negative for retroperitoneal or other extraluminal bleed.  Recommend supportive care. Can clean the PEG tube site with hydrogen peroxide. If tremendous bleeding around the PEG tube site were to develop, then silver nitrate sticks could be used locally. Transfuse as clinically indicated. Finally, when you decide to pull the PEG tube, it may be best to have the INR normal to reduce trauma associated bleeding. Please call for questions. Will sign off. Thank you  Wilhemina Bonito. Eda Keys., M.D. [facility] G[facility]enter[facility].

## 2013-12-09 NOTE — Progress Notes (Signed)
Social Work Patient ID: Sarah Tran, female   DOB: 01/03/1946, 67 y.o.   MRN: [id number] Met with pt and brother to inform tub bench and rolling walker is not covered by her Medicare, wheelchair is.  Brother reports: " That is fine go ahead and get it." Both preparing for going home on Monday.  Pt states; " It has been too long since I have been there, I am really looking forward to going home." Pt requires a lightweight wheelchair due to she is unable to self propel a standard weight wheelchair.  She uses for her self care tasks also. Will work toward discharge Monday. 

## 2013-12-09 NOTE — Progress Notes (Signed)
Speech Language Pathology Daily Session Note  Patient Details  Name: Sarah Tran MRN: [id number] Date of Birth: 14-Sep-1945  Today's Date: 12/09/2013 Time: 7169-6789 Time Calculation (min): 40 min  Short Term Goals: Week 3: SLP Short Term Goal 1 (Week 3): Pt will demonstrate basic problem solving during familiar, functional tasks with min cues SLP Short Term Goal 2 (Week 3): Pt will verbalize at least at least 1 physical and 2 cognitive impairments as well as their impact on pt's independence in her home environment with min cues.  SLP Short Term Goal 3 (Week 3): Pt will utilize external memory aids to facilitate recall of new and daily information with supervision.   SLP Short Term Goal 4 (Week 3): Pt will improve alternating/divided attention to functional task for 15-20 minutes with Min cues  Skilled Therapeutic Interventions:  Pt's family was seen for skilled speech therapy targeting memory and executive function.  During a structured scheduling task, pt required mod faded to min verbal and visual cuing for thought organization and planning over 7 targeted trials for 100% accuracy.  Per pt's family, pt's daughter and granddaughter will be available to provide 24/7 supervision for safety and assistance for medication and financial management.  Pt verbalized at least 2 ways in which her cognitive and physical impairments will impact her functional independence in her home environment with supervision and demonstrated good safety/judgment during semi-structured verbal reasoning tasks.  Pt is on track for discharge Monday.    FIM:  Comprehension Comprehension Mode: Auditory Comprehension: 5-Follows basic conversation/direction: With extra time/assistive device Expression Expression Mode: Verbal Expression: 5-Expresses basic needs/ideas: With no assist Social Interaction Social Interaction: 6-Interacts appropriately with others with medication or extra time (anti-anxiety,  antidepressant). Problem Solving Problem Solving: 5-Solves basic 90% of the time/requires cueing < 10% of the time Memory Memory: 4-Recognizes or recalls 75 - 89% of the time/requires cueing 10 - 24% of the time  Pain Pain Assessment Pain Assessment: No/denies pain  Therapy/Group: Individual Therapy  Jackalyn Lombard, M.A. CCC-SLP  Melanee Spry Tannar Broker 12/09/2013, 3:19 PM

## 2013-12-09 NOTE — Progress Notes (Signed)
ANTICOAGULATION CONSULT NOTE - Follow Up Consult  Pharmacy Consult for coumadin Indication: afib + hx of DVT  Allergies  Allergen Reactions  . Codeine Other (See Comments)    Pass out    . Peanut Butter Flavor Hives and Other (See Comments)    Lockjaw   . Penicillins Swelling    Patient Measurements: Height: 5' 2.99" (160 cm) Weight: 231 lb 11.3 oz (105.1 kg) IBW/kg (Calculated) : 52.38 Heparin Dosing Weight:   Vital Signs: Temp: 97.4 F (36.3 C) (05/29 0500) Temp src: Oral (05/29 0500) BP: 186/100 mmHg (05/29 0743) Pulse Rate: 81 (05/29 0743)  Labs:  Recent Labs  12/07/13 0600 12/08/13 0520 12/09/13 0645  LABPROT 36.9* 34.0* 29.2*  INR 3.92* 3.52* 2.89*    Estimated Creatinine Clearance: 28.4 ml/min (by C-G formula based on Cr of 2.29).   Medications:  Scheduled:  . atorvastatin  10 mg Oral q1800  . carvedilol  25 mg Oral BID WC  . clonazePAM  0.5 mg Oral BID  . famotidine  20 mg Oral Daily  . furosemide  40 mg Oral Daily  . lactobacillus  1 g Oral TID WC  . methimazole  5 mg Oral Daily  . nitroGLYCERIN  0.2 mg Transdermal Daily  . potassium chloride  40 mEq Oral Daily  . predniSONE  15 mg Oral Q breakfast  . senna-docusate  1 tablet Oral BID  . sodium bicarbonate  1,300 mg Oral BID  . timolol  1 drop Both Eyes BID  . vitamin C  500 mg Oral BID  . Warfarin - Pharmacist Dosing Inpatient   Does not apply q1800  . zinc sulfate  220 mg Oral Daily   Infusions:    Assessment: 68 yo female with afib + hx of DVT is currently on therapeutic coumadin.  INR is down to 2.89 from 3.52 (last dose of coumadin was 5/23). Goal of Therapy:  INR 2-3 Monitor platelets by anticoagulation protocol: Yes   Plan:  1) coumadin 5 mg po x1 2) INR in am  Tsz-Yin Freya Zobrist 12/09/2013,8:20 AM

## 2013-12-09 NOTE — Progress Notes (Signed)
Occupational Therapy Weekly Progress Note  Patient Details  Name: Sarah Tran MRN: [id number] Date of Birth: 1945/07/17  Beginning of progress report period: Dec 01, 2013 End of progress report period: Dec 09, 2013  Today's Date: 12/09/2013 Time: 1000-1045 Time Calculation (min): 45 min  Patient has met 4 of 4 short term goals.  Pt has made excellent progress with her rehab and has become very independent in many aspects of her self care.  Patient continues to demonstrate the following deficits: decreased dynamic standing balance and activity tolerance and therefore will continue to benefit from skilled OT intervention to enhance overall performance with BADL.  Patient progressing toward long term goals..  Continue plan of care.  OT Short Term Goals Week 1:  OT Short Term Goal 1 (Week 1): Pt will be able to stand at EOB with RW for 1 min with mod A to be able to pull pants over hips. OT Short Term Goal 1 - Progress (Week 1): Met OT Short Term Goal 2 (Week 1): Pt will don shirt with min A. OT Short Term Goal 2 - Progress (Week 1): Met OT Short Term Goal 3 (Week 1): Pt will demonstrate improved BUE shoulder AROM to lift her arms to be able to wash under them without assist. OT Short Term Goal 3 - Progress (Week 1): Met OT Short Term Goal 4 (Week 1): Pt will transfer to drop arm BSC with mod A x1.  OT Short Term Goal 4 - Progress (Week 1): Met Week 2:  OT Short Term Goal 1 (Week 2): Pt will be able to ambulate in and out of bathroom with RW with steady A.  OT Short Term Goal 1 - Progress (Week 2): Met OT Short Term Goal 2 (Week 2): Pt will be able to toilet with mod I.  OT Short Term Goal 2 - Progress (Week 2): Met OT Short Term Goal 3 (Week 2): Pt will be able to dress LB with set up except for shoes and socks. OT Short Term Goal 3 - Progress (Week 2): Met OT Short Term Goal 4 (Week 2): Pt will be able to transfer on and off a tub bench (simulated with clothing on) with min A. OT Short  Term Goal 4 - Progress (Week 2): Met Week 3:  OT Short Term Goal 1 (Week 3): STGs = LTGs   Skilled Therapeutic Interventions/Progress Updates:    Pt had already bathed and dressed this am. Pt's brother present for education. Pt seen this session to work toward discharge goals of bathroom transfers. Pt ambulated to toilet with RW with distant S and used toilet with mod I.  Pt was then taken to ADL apartment where she practiced getting on and off the tub bench (same type she will be taking home with her) using RW to walk into bathroom and min A to bring legs over tub wall.  Pt then ambulated to kitchen to practice reaching for spice jars from the cupboards (lower shelf only as she only has 90 degrees of AROM) and for pots and pans from lower cupboards. Encouraged pt to work with her family members on basic cooking at home to keep her on her feet for longer periods of time to build her endurance.  Pt and her brother are competent with all of her self care and transfers.   Continue OT 5-7 days a week for 60-90 min until discharge for ADL retraining, pt education, and functional mobility.    Therapy Documentation Precautions:  Precautions Precautions: Fall Precaution Comments: Pt with colostomy, PEG, mid abdominal wound site, healing trach site with small air leak; previous R TKA Restrictions Weight Bearing Restrictions: No    Vital Signs: Therapy Vitals Pulse Rate: 81 BP: 186/100 mmHg Pain: Pain Assessment Pain Assessment: No/denies pain ADL: ADL ADL Comments: Refer to FIM  See FIM for current functional status  Therapy/Group: Individual Therapy  Sarah Tran 12/09/2013, 11:19 AM

## 2013-12-10 ENCOUNTER — Inpatient Hospital Stay (HOSPITAL_COMMUNITY): Payer: Medicare Other

## 2013-12-10 LAB — GLUCOSE, CAPILLARY
Glucose-Capillary: 87 mg/dL (ref 70–99)
Glucose-Capillary: 128 mg/dL — ABNORMAL HIGH (ref 70–99)
Glucose-Capillary: 194 mg/dL — ABNORMAL HIGH (ref 70–99)
Glucose-Capillary: 188 mg/dL — ABNORMAL HIGH (ref 70–99)

## 2013-12-10 LAB — CBC
HEMATOCRIT: 20.1 % — AB (ref 36.0–46.0)
HEMOGLOBIN: 6.3 g/dL — AB (ref 12.0–15.0)
MCH: 29.3 pg (ref 26.0–34.0)
MCHC: 31.3 g/dL (ref 30.0–36.0)
MCV: 93.5 fL (ref 78.0–100.0)
Platelets: 192 10*3/uL (ref 150–400)
RBC: 2.15 MIL/uL — ABNORMAL LOW (ref 3.87–5.11)
RDW: 17.6 % — ABNORMAL HIGH (ref 11.5–15.5)
WBC: 4 10*3/uL (ref 4.0–10.5)

## 2013-12-10 LAB — HEMOGLOBIN AND HEMATOCRIT, BLOOD
HEMATOCRIT: 29 % — AB (ref 36.0–46.0)
Hemoglobin: 9.6 g/dL — ABNORMAL LOW (ref 12.0–15.0)

## 2013-12-10 LAB — PROTIME-INR
INR: 2.91 — AB (ref 0.00–1.49)
Prothrombin Time: 29.4 seconds — ABNORMAL HIGH (ref 11.6–15.2)

## 2013-12-10 LAB — ABO/RH: ABO/RH(D): A POS

## 2013-12-10 LAB — PREPARE RBC (CROSSMATCH)

## 2013-12-10 MED ORDER — AMLODIPINE BESYLATE 5 MG PO TABS
5.0000 mg | ORAL_TABLET | Freq: Every day | ORAL | Status: DC
  Administered 2013-12-10 – 2013-12-12 (×3): 5 mg via ORAL
  Filled 2013-12-10 (×5): qty 1

## 2013-12-10 MED ORDER — ACETAMINOPHEN 325 MG PO TABS
650.0000 mg | ORAL_TABLET | Freq: Once | ORAL | Status: AC
  Administered 2013-12-10: 650 mg via ORAL
  Filled 2013-12-10: qty 2

## 2013-12-10 MED ORDER — FERROUS SULFATE 325 (65 FE) MG PO TABS
325.0000 mg | ORAL_TABLET | Freq: Three times a day (TID) | ORAL | Status: DC
  Administered 2013-12-10 – 2013-12-12 (×7): 325 mg via ORAL
  Filled 2013-12-10 (×10): qty 1

## 2013-12-10 MED ORDER — DARBEPOETIN ALFA-POLYSORBATE 40 MCG/0.4ML IJ SOLN
40.0000 ug | INTRAMUSCULAR | Status: DC
  Administered 2013-12-10: 40 ug via SUBCUTANEOUS
  Filled 2013-12-10: qty 0.4

## 2013-12-10 NOTE — Progress Notes (Addendum)
Subjective/Complaints: 68 year old right-handed morbidly obese African American female. Past medical history of hypertension, renal insufficiency, obstructive sleep apnea with CPAP, DVT/A. fib with chronic Coumadin. Hospital course patient admitted to [facility] 09/28/2013 for removal of a ileal polyp. Noted perioperative complications balloon catheter became dislodged. She required exploratory laparotomy and diverting colostomy as well as appendectomy secondary to perforated sigmoid colon. Her hospital was complicated by respiratory failure requiring intubation and eventually tracheostomy as well his gastrostomy tube for nutritional support. . 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  Appreciate GI note, no acute GI bleed suspected Was on procrit at home  GI rec INR "near normal for PEG to be pulled here No dizziness in PT yesterday  Review of Systems - otherwise negative Objective: Vital Signs: Blood pressure 182/79, pulse 75, temperature 98.4 F (36.9 C), temperature source Oral, resp. rate 18, height 5' 2.99" (1.6 m), weight 105.1 kg (231 lb 11.3 oz), SpO2 99.00%. No results found. Results for orders placed during the hospital encounter of 11/22/13 (from the past 72 hour(s))  GLUCOSE, CAPILLARY     Status: None   Collection Time    12/07/13  7:14 AM      Result Value Ref Range   Glucose-Capillary 87  70 - 99 mg/dL   Comment 1 Notify RN    GLUCOSE, CAPILLARY     Status: Abnormal   Collection Time    12/07/13 12:00 PM      Result Value Ref Range   Glucose-Capillary 112 (*) 70 - 99 mg/dL   Comment 1 Notify RN    GLUCOSE, CAPILLARY     Status: Abnormal   Collection Time    12/07/13  4:42 PM      Result Value Ref Range   Glucose-Capillary 193 (*) 70 - 99 mg/dL   Comment 1 Notify RN    GLUCOSE, CAPILLARY     Status: Abnormal   Collection Time   12/07/13  9:06 PM      Result Value Ref Range   Glucose-Capillary 130 (*) 70 - 99 mg/dL  PROTIME-INR     Status: Abnormal   Collection Time    12/08/13  5:20 AM      Result Value Ref Range   Prothrombin Time 34.0 (*) 11.6 - 15.2 seconds   INR 3.52 (*) 0.00 - 1.49  GLUCOSE, CAPILLARY     Status: None   Collection Time    12/08/13  7:20 AM      Result Value Ref Range   Glucose-Capillary 84  70 - 99 mg/dL   Comment 1 Notify RN    GLUCOSE, CAPILLARY     Status: Abnormal   Collection Time    12/08/13 12:02 PM      Result Value Ref Range   Glucose-Capillary 130 (*) 70 - 99 mg/dL   Comment 1 Notify RN    GLUCOSE, CAPILLARY     Status: Abnormal   Collection Time    12/08/13  4:33 PM      Result Value Ref Range   Glucose-Capillary 208 (*) 70 - 99 mg/dL   Comment 1 Notify RN    GLUCOSE, CAPILLARY     Status: None   Collection Time    12/08/13  4:38 PM      Result Value Ref Range   Glucose-Capillary 97  70 - 99 mg/dL   Comment 1 Notify RN  GLUCOSE, CAPILLARY     Status: Abnormal   Collection Time    12/08/13  6:20 PM      Result Value Ref Range   Glucose-Capillary 116 (*) 70 - 99 mg/dL   Comment 1 Notify RN    GLUCOSE, CAPILLARY     Status: Abnormal   Collection Time    12/08/13  9:26 PM      Result Value Ref Range   Glucose-Capillary 215 (*) 70 - 99 mg/dL  PROTIME-INR     Status: Abnormal   Collection Time    12/09/13  6:45 AM      Result Value Ref Range   Prothrombin Time 29.2 (*) 11.6 - 15.2 seconds   INR 2.89 (*) 0.00 - 1.49  GLUCOSE, CAPILLARY     Status: None   Collection Time    12/09/13  7:12 AM      Result Value Ref Range   Glucose-Capillary 81  70 - 99 mg/dL   Comment 1 Notify RN    CBC WITH DIFFERENTIAL     Status: Abnormal   Collection Time    12/09/13  9:19 AM      Result Value Ref Range   WBC 4.9  4.0 - 10.5 K/uL   RBC 2.42 (*) 3.87 - 5.11 MIL/uL   Hemoglobin 7.1 (*) 12.0 - 15.0 g/dL   HCT 16.1 (*) 09.6 - 04.5 %   MCV 92.6  78.0 - 100.0 fL   MCH  29.3  26.0 - 34.0 pg   MCHC 31.7  30.0 - 36.0 g/dL   RDW 40.9 (*) 81.1 - 91.4 %   Platelets 171  150 - 400 K/uL   Neutrophils Relative % 69  43 - 77 %   Neutro Abs 3.4  1.7 - 7.7 K/uL   Lymphocytes Relative 22  12 - 46 %   Lymphs Abs 1.1  0.7 - 4.0 K/uL   Monocytes Relative 7  3 - 12 %   Monocytes Absolute 0.3  0.1 - 1.0 K/uL   Eosinophils Relative 2  0 - 5 %   Eosinophils Absolute 0.1  0.0 - 0.7 K/uL   Basophils Relative 0  0 - 1 %   Basophils Absolute 0.0  0.0 - 0.1 K/uL  GLUCOSE, CAPILLARY     Status: Abnormal   Collection Time    12/09/13 11:28 AM      Result Value Ref Range   Glucose-Capillary 154 (*) 70 - 99 mg/dL   Comment 1 Notify RN    CBC     Status: Abnormal   Collection Time    12/09/13  3:02 PM      Result Value Ref Range   WBC 4.8  4.0 - 10.5 K/uL   RBC 2.40 (*) 3.87 - 5.11 MIL/uL   Hemoglobin 7.2 (*) 12.0 - 15.0 g/dL   HCT 78.2 (*) 95.6 - 21.3 %   MCV 92.5  78.0 - 100.0 fL   MCH 30.0  26.0 - 34.0 pg   MCHC 32.4  30.0 - 36.0 g/dL   RDW 08.6 (*) 57.8 - 46.9 %   Platelets 173  150 - 400 K/uL  GLUCOSE, CAPILLARY     Status: Abnormal   Collection Time    12/09/13  4:54 PM      Result Value Ref Range   Glucose-Capillary 144 (*) 70 - 99 mg/dL   Comment 1 Notify RN    GLUCOSE, CAPILLARY     Status: Abnormal   Collection  Time    12/09/13  9:35 PM      Result Value Ref Range   Glucose-Capillary 136 (*) 70 - 99 mg/dL  PROTIME-INR     Status: Abnormal   Collection Time    12/10/13  5:40 AM      Result Value Ref Range   Prothrombin Time 29.4 (*) 11.6 - 15.2 seconds   INR 2.91 (*) 0.00 - 1.49     HEENT: healing trach site with air leak General: No acute distress Mood and affect are appropriate Heart: Regular rate and rhythm no rubs murmurs or extra sounds Lungs: Clear to auscultation, breathing unlabored, no rales or wheezes Abdomen: Positive bowel sounds, soft nontender to palpation, nondistended. Extremities: No clubbing, cyanosis, or edema, G tube site  healed Skin: No evidence of breakdown, no evidence of rash Neurologic: Cranial nerves II through XII intact, motor strength is 3-/5 in bilateral deltoid, 4/5 bicep, tricep, grip, 3-/5 R hip and Knee flexor,4- knee extensors,3- Left Hip flexor  ankle dorsiflexor and plantar flexor    Musculoskeletal: Tenderness mild right Achilles  Assessment/Plan: 1. Functional deficits secondary to Critical illness myopathy,  encephalopathy which require 3+ hours per day of interdisciplinary therapy in a comprehensive inpatient rehab setting. Physiatrist is providing close team supervision and 24 hour management of active medical problems listed below. Physiatrist and rehab team continue to assess barriers to discharge/monitor patient progress toward functional and medical goals.  FIM: FIM - Bathing Bathing Steps Patient Completed: Chest;Right Arm;Left Arm;Abdomen;Right upper leg;Left upper leg;Front perineal area;Buttocks;Right lower leg (including foot);Left lower leg (including foot) Bathing: 5: Supervision: Safety issues/verbal cues  FIM - Upper Body Dressing/Undressing Upper body dressing/undressing steps patient completed: Thread/unthread right sleeve of pullover shirt/dresss;Thread/unthread left sleeve of pullover shirt/dress;Put head through opening of pull over shirt/dress;Pull shirt over trunk Upper body dressing/undressing: 5: Supervision: Safety issues/verbal cues FIM - Lower Body Dressing/Undressing Lower body dressing/undressing steps patient completed: Thread/unthread right underwear leg;Thread/unthread left underwear leg;Thread/unthread right pants leg;Thread/unthread left pants leg;Pull pants up/down;Pull underwear up/down;Don/Doff left sock;Don/Doff right sock;Don/Doff left shoe;Fasten/unfasten right shoe;Fasten/unfasten left shoe;Don/Doff right shoe Lower body dressing/undressing: 5: Supervision: Safety issues/verbal cues  FIM - Toileting Toileting steps completed by patient: Adjust clothing  prior to toileting;Performs perineal hygiene;Adjust clothing after toileting Toileting Assistive Devices: Grab bar or rail for support Toileting: 6: More than reasonable amount of time  FIM - Diplomatic Services operational officerToilet Transfers Toilet Transfers Assistive Devices: Walker;Grab bars Toilet Transfers: 5-To toilet/BSC: Supervision (verbal cues/safety issues);5-From toilet/BSC: Supervision (verbal cues/safety issues)  FIM - Press photographerBed/Chair Transfer Bed/Chair Transfer Assistive Devices: Arm rests;Walker Bed/Chair Transfer: 5: Supine > Sit: Supervision (verbal cues/safety issues);5: Sit > Supine: Supervision (verbal cues/safety issues);5: Bed > Chair or W/C: Supervision (verbal cues/safety issues);4: Chair or W/C > Bed: Min A (steadying Pt. > 75%)  FIM - Locomotion: Wheelchair Distance: 50 Locomotion: Wheelchair: 1: Total Assistance/staff pushes wheelchair (Pt<25%) FIM - Locomotion: Ambulation Locomotion: Ambulation Assistive Devices: Designer, industrial/productWalker - Rolling Ambulation/Gait Assistance: 5: Supervision Locomotion: Ambulation: 5: Travels 150 ft or more with supervision/safety issues  Comprehension Comprehension Mode: Auditory Comprehension: 5-Follows basic conversation/direction: With extra time/assistive device  Expression Expression Mode: Verbal Expression: 5-Expresses basic needs/ideas: With no assist  Social Interaction Social Interaction: 6-Interacts appropriately with others with medication or extra time (anti-anxiety, antidepressant).  Problem Solving Problem Solving: 5-Solves basic 90% of the time/requires cueing < 10% of the time  Memory Memory: 4-Recognizes or recalls 75 - 89% of the time/requires cueing 10 - 24% of the time   Medical Problem List and Plan:  1. 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 DVT Prophylaxis/Anticoagulation: Subcutaneous heparin discontinued and chronic Coumadin resumed 11/22/2013 without bridging. . Doppler  showed possible Left popliteal DVT in a technically limited study, no progressive symptoms.  INR supratherapeutic 3. Pain Management: Oxycodone immediate release 5 mg every 6 hours as needed. Added kpad for right thigh 4. Mood: Clonazepam 0.5 mg 3 times a day. positive reinforcement  5. Neuropsych: This patient is capable of making decisions on her own behalf.  6. 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 Shortness of breath today appears to be anxiety related, her RN the patient is due for some Klonopin 7. Dysphagia. Mechanical soft diet. Gastrostomy tube feeds on hold since admission Should be able to D/C PEG if INR<3 8. Diabetes mellitus. Glipizide will d/c  Checking blood sugars a.c. and at bedtime. Sugars low in am. D/C levemir 5/18 9. Hypertension/atrial fibrillation.Uncontrolled Lasix 40 mg daily, Resume Norvasc  Given HR in 80s increase Coreg 25mg  twice a day  10. Hypothyroidism. Tapazole 5 mg daily. Latest TSH level 2.230  11. Acute on Chronic anemia.  hgb 8.7 > 6.3 , check CT Abd and pelvis, Discussed with GI , no sign of active GI Bleed CT abd and pelvis without bleed Furhter hx from family, pt gets 2 transfusions per year , usually in April or May, on Procrit at home 12. Obstructive sleep apnea. Resumed CPAP.--pt seems to be tolerating well 13.  Pt with G tube drainage,resolved , ask pharmacy to hold warfarin to allow INR to drift down to normal.  If INR stays elevated until D/C will need to resume and schedule outpt f/u with MD who inserted PEG LOS (Days) 18 A FACE TO FACE EVALUATION WAS PERFORMED  Sarah Tran 12/10/2013, 7:13 AM

## 2013-12-10 NOTE — Progress Notes (Signed)
5 RN received Critical Lab call this AM from R. Neva Seat of Hemoglobin of 6.3.  RN notified MD (Kirsteins) of results at 0745.

## 2013-12-10 NOTE — Progress Notes (Signed)
Physical Therapy Note  Patient Details  Name: Sarah Tran MRN: [id number] Date of Birth: 1946/05/22 Today's Date: 12/10/2013  Pt missed 60 min PT due to blood transfusion.  Pt's hematocrit 6.3 this AM.  CT ruled out bleed; pt took Procrit at home PTA .  D/c Monday still planned.  PT instructed brother to help her perform bedside straight leg raises, ankle circles and glut sets in bed, per HEP hand out.  Susa Loffler 12/10/2013, 2:41 PM

## 2013-12-10 NOTE — Progress Notes (Signed)
ANTICOAGULATION CONSULT NOTE - Follow Up Consult  Pharmacy Consult for coumadin Indication: afib + hx of DVT  Allergies  Allergen Reactions  . Codeine Other (See Comments)    Pass out    . Peanut Butter Flavor Hives and Other (See Comments)    Lockjaw   . Penicillins Swelling    Patient Measurements: Height: 5' 2.99" (160 cm) Weight: 231 lb 11.3 oz (105.1 kg) IBW/kg (Calculated) : 52.38   Vital Signs: Temp: 97.1 F (36.2 C) (05/30 1333) Temp src: Oral (05/30 1333) BP: 171/89 mmHg (05/30 1333) Pulse Rate: 81 (05/30 1333)  Labs:  Recent Labs  12/08/13 0520 12/09/13 0645  12/09/13 0919 12/09/13 1502 12/10/13 0540  HGB  --   --   < > 7.1* 7.2* 6.3*  HCT  --   --   --  22.4* 22.2* 20.1*  PLT  --   --   --  171 173 192  LABPROT 34.0* 29.2*  --   --   --  29.4*  INR 3.52* 2.89*  --   --   --  2.91*  < > = values in this interval not displayed.  Estimated Creatinine Clearance: 28.4 ml/min (by C-G formula based on Cr of 2.29).   Medications:  Scheduled:  . amLODipine  5 mg Oral Daily  . atorvastatin  10 mg Oral q1800  . carvedilol  25 mg Oral BID WC  . clonazePAM  0.5 mg Oral BID  . darbepoetin (ARANESP) injection - NON-DIALYSIS  40 mcg Subcutaneous Q Sat-1800  . ferrous sulfate  325 mg Oral TID WC  . furosemide  40 mg Oral Daily  . lactobacillus  1 g Oral TID WC  . methimazole  5 mg Oral Daily  . nitroGLYCERIN  0.2 mg Transdermal Daily  . pantoprazole  40 mg Oral Q0600  . potassium chloride  40 mEq Oral Daily  . predniSONE  15 mg Oral Q breakfast  . senna-docusate  1 tablet Oral BID  . sodium bicarbonate  1,300 mg Oral BID  . timolol  1 drop Both Eyes BID  . vitamin C  500 mg Oral BID  . Warfarin - Pharmacist Dosing Inpatient   Does not apply q1800  . zinc sulfate  220 mg Oral Daily   Infusions:    Assessment: 67 YOF on warfarin for hx Afib/DVT with a therapeutic INR this morning after resuming warfarin yesterday evening (INR 2.91 << 2.89, goal of  2-3). Hgb down to 6.3 - noted bleeding around PEG and GI consulted. Primary team wishes to hold warfarin for PEG removal. ESA started today.  Goal of Therapy:  INR 2-3 Monitor platelets by anticoagulation protocol: Yes   Plan:  1. Hold warfarin for now 2. Will continue to monitor for any signs/symptoms of bleeding, PT/INR, and plans to resume warfarin  Georgina Pillion, PharmD, BCPS Clinical Pharmacist Pager: [phone] 12/10/2013 2:43 PM

## 2013-12-11 ENCOUNTER — Inpatient Hospital Stay (HOSPITAL_COMMUNITY): Payer: Medicare Other | Admitting: Occupational Therapy

## 2013-12-11 LAB — GLUCOSE, CAPILLARY
GLUCOSE-CAPILLARY: 162 mg/dL — AB (ref 70–99)
Glucose-Capillary: 97 mg/dL (ref 70–99)
Glucose-Capillary: 214 mg/dL — ABNORMAL HIGH (ref 70–99)
Glucose-Capillary: 124 mg/dL — ABNORMAL HIGH (ref 70–99)

## 2013-12-11 LAB — IRON AND TIBC
IRON: 55 ug/dL (ref 42–135)
SATURATION RATIOS: 30 % (ref 20–55)
TIBC: 183 ug/dL — AB (ref 250–470)
UIBC: 128 ug/dL (ref 125–400)

## 2013-12-11 LAB — CBC
HEMATOCRIT: 25.2 % — AB (ref 36.0–46.0)
HEMOGLOBIN: 8.4 g/dL — AB (ref 12.0–15.0)
MCH: 29.6 pg (ref 26.0–34.0)
MCHC: 33.3 g/dL (ref 30.0–36.0)
MCV: 88.7 fL (ref 78.0–100.0)
Platelets: 184 10*3/uL (ref 150–400)
RBC: 2.84 MIL/uL — AB (ref 3.87–5.11)
RDW: 18.1 % — ABNORMAL HIGH (ref 11.5–15.5)
WBC: 4.9 10*3/uL (ref 4.0–10.5)

## 2013-12-11 LAB — TYPE AND SCREEN
ABO/RH(D): A POS
Antibody Screen: NEGATIVE
UNIT DIVISION: 0
Unit division: 0

## 2013-12-11 LAB — FOLATE: FOLATE: 17.6 ng/mL

## 2013-12-11 LAB — RETICULOCYTES
RBC.: 2.84 MIL/uL — ABNORMAL LOW (ref 3.87–5.11)
Retic Count, Absolute: 59.6 10*3/uL (ref 19.0–186.0)
Retic Ct Pct: 2.1 % (ref 0.4–3.1)

## 2013-12-11 LAB — VITAMIN B12: VITAMIN B 12: 557 pg/mL (ref 211–911)

## 2013-12-11 LAB — PROTIME-INR
INR: 2.64 — ABNORMAL HIGH (ref 0.00–1.49)
Prothrombin Time: 27.3 seconds — ABNORMAL HIGH (ref 11.6–15.2)

## 2013-12-11 LAB — FERRITIN: Ferritin: 463 ng/mL — ABNORMAL HIGH (ref 10–291)

## 2013-12-11 MED ORDER — ENOXAPARIN SODIUM 30 MG/0.3ML ~~LOC~~ SOLN: 30.0000 mg | Freq: Two times a day (BID) | SUBCUTANEOUS | Status: DC

## 2013-12-11 NOTE — Progress Notes (Addendum)
Subjective/Complaints: 68 year old right-handed morbidly obese African American female. Past medical history of hypertension, renal insufficiency, obstructive sleep apnea with CPAP, DVT/A. fib with chronic Coumadin. Hospital course patient admitted to [facility] 09/28/2013 for removal of a ileal polyp. Noted perioperative complications balloon catheter became dislodged. She required exploratory laparotomy and diverting colostomy as well as appendectomy secondary to perforated sigmoid colon. Her hospital was complicated by respiratory failure requiring intubation and eventually tracheostomy as well his gastrostomy tube for nutritional support. . 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  Appreciate GI note, no acute GI bleed suspected Was on procrit at home  GI rec INR "near normal for PEG to be pulled here No dizziness in PT yesterday  Review of Systems - otherwise negative Objective: Vital Signs: Blood pressure 187/78, pulse 69, temperature 98.1 F (36.7 C), temperature source Oral, resp. rate 18, height 5' 2.99" (1.6 m), weight 105.1 kg (231 lb 11.3 oz), SpO2 99.00%. Ct Abdomen Pelvis Wo Contrast  12/10/2013   CLINICAL DATA:  Anemia, evaluate for retroperitoneal bleed/hematoma, on Coumadin  EXAM: CT ABDOMEN AND PELVIS WITHOUT CONTRAST  TECHNIQUE: Multidetector CT imaging of the abdomen and pelvis was performed following the standard protocol without IV contrast.  COMPARISON:  11/28/2013  FINDINGS: Motion degraded images.  Mild nodular scarring at the right lung base.  Moderate hiatal hernia.  Gastrostomy tube in satisfactory position.  Unenhanced liver, spleen, pancreas, and left adrenal gland are within normal limits. 3.7 x 2.5 cm right adrenal adenoma , benign.  Status post cholecystectomy. No intrahepatic or extrahepatic ductal dilatation.  Bilateral renal cysts,  including a 4.7 x 5.3 cm left anterior interpolar cyst and a 1.9 x 2.1 cm posterior interpolar right renal cyst (series 201/image 32). No renal calculi or hydronephrosis.  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 Colonic diverticulosis, without associated inflammatory changes.  No evidence of abdominal aortic aneurysm.  No abdominopelvic ascites.  No suspicious abdominopelvic lymphadenopathy.  Status post hysterectomy.  No adnexal masses.  Bladder is underdistended but unremarkable.  Postsurgical changes involving the midline anterior abdominal wall.  No evidence of a retroperitoneal hematoma.  Degenerative changes of the visualized thoracolumbar spine. Mild to moderate degenerative changes of the bilateral hips, right greater than left.  IMPRESSION: No evidence of retroperitoneal hematoma.  No interval change from recent CT.   Electronically Signed   By: Charline Bills M.D.   On: 12/10/2013 09:36   Results for orders placed during the hospital encounter of 11/22/13 (from the past 72 hour(s))  GLUCOSE, CAPILLARY     Status: Abnormal   Collection Time    12/08/13 12:02 PM      Result Value Ref Range   Glucose-Capillary 130 (*) 70 - 99 mg/dL   Comment 1 Notify RN    GLUCOSE, CAPILLARY     Status: Abnormal   Collection Time    12/08/13  4:33 PM      Result Value Ref Range   Glucose-Capillary 208 (*) 70 - 99 mg/dL   Comment 1 Notify RN    GLUCOSE, CAPILLARY     Status: None   Collection Time    12/08/13  4:38 PM      Result Value Ref Range   Glucose-Capillary 97  70 - 99 mg/dL   Comment 1 Notify RN    GLUCOSE, CAPILLARY     Status: Abnormal  Collection Time    12/08/13  6:20 PM      Result Value Ref Range   Glucose-Capillary 116 (*) 70 - 99 mg/dL   Comment 1 Notify RN    GLUCOSE, CAPILLARY     Status: Abnormal   Collection Time    12/08/13  9:26 PM      Result Value Ref Range   Glucose-Capillary 215 (*) 70 - 99 mg/dL  PROTIME-INR      Status: Abnormal   Collection Time    12/09/13  6:45 AM      Result Value Ref Range   Prothrombin Time 29.2 (*) 11.6 - 15.2 seconds   INR 2.89 (*) 0.00 - 1.49  GLUCOSE, CAPILLARY     Status: None   Collection Time    12/09/13  7:12 AM      Result Value Ref Range   Glucose-Capillary 81  70 - 99 mg/dL   Comment 1 Notify RN    CBC WITH DIFFERENTIAL     Status: Abnormal   Collection Time    12/09/13  9:19 AM      Result Value Ref Range   WBC 4.9  4.0 - 10.5 K/uL   RBC 2.42 (*) 3.87 - 5.11 MIL/uL   Hemoglobin 7.1 (*) 12.0 - 15.0 g/dL   HCT 78.2 (*) 95.6 - 21.3 %   MCV 92.6  78.0 - 100.0 fL   MCH 29.3  26.0 - 34.0 pg   MCHC 31.7  30.0 - 36.0 g/dL   RDW 08.6 (*) 57.8 - 46.9 %   Platelets 171  150 - 400 K/uL   Neutrophils Relative % 69  43 - 77 %   Neutro Abs 3.4  1.7 - 7.7 K/uL   Lymphocytes Relative 22  12 - 46 %   Lymphs Abs 1.1  0.7 - 4.0 K/uL   Monocytes Relative 7  3 - 12 %   Monocytes Absolute 0.3  0.1 - 1.0 K/uL   Eosinophils Relative 2  0 - 5 %   Eosinophils Absolute 0.1  0.0 - 0.7 K/uL   Basophils Relative 0  0 - 1 %   Basophils Absolute 0.0  0.0 - 0.1 K/uL  GLUCOSE, CAPILLARY     Status: Abnormal   Collection Time    12/09/13 11:28 AM      Result Value Ref Range   Glucose-Capillary 154 (*) 70 - 99 mg/dL   Comment 1 Notify RN    CBC     Status: Abnormal   Collection Time    12/09/13  3:02 PM      Result Value Ref Range   WBC 4.8  4.0 - 10.5 K/uL   RBC 2.40 (*) 3.87 - 5.11 MIL/uL   Hemoglobin 7.2 (*) 12.0 - 15.0 g/dL   HCT 62.9 (*) 52.8 - 41.3 %   MCV 92.5  78.0 - 100.0 fL   MCH 30.0  26.0 - 34.0 pg   MCHC 32.4  30.0 - 36.0 g/dL   RDW 24.4 (*) 01.0 - 27.2 %   Platelets 173  150 - 400 K/uL  GLUCOSE, CAPILLARY     Status: Abnormal   Collection Time    12/09/13  4:54 PM      Result Value Ref Range   Glucose-Capillary 144 (*) 70 - 99 mg/dL   Comment 1 Notify RN    GLUCOSE, CAPILLARY     Status: Abnormal   Collection Time    12/09/13  9:35 PM  Result  Value Ref Range   Glucose-Capillary 136 (*) 70 - 99 mg/dL  PROTIME-INR     Status: Abnormal   Collection Time    12/10/13  5:40 AM      Result Value Ref Range   Prothrombin Time 29.4 (*) 11.6 - 15.2 seconds   INR 2.91 (*) 0.00 - 1.49  CBC     Status: Abnormal   Collection Time    12/10/13  5:40 AM      Result Value Ref Range   WBC 4.0  4.0 - 10.5 K/uL   RBC 2.15 (*) 3.87 - 5.11 MIL/uL   Hemoglobin 6.3 (*) 12.0 - 15.0 g/dL   Comment: REPEATED TO VERIFY     CRITICAL RESULT CALLED TO, READ BACK BY AND VERIFIED WITH:     STEPHINE TURNER RN [id number] GREEN R   HCT 20.1 (*) 36.0 - 46.0 %   MCV 93.5  78.0 - 100.0 fL   MCH 29.3  26.0 - 34.0 pg   MCHC 31.3  30.0 - 36.0 g/dL   RDW 04.5 (*) 40.9 - 81.1 %   Platelets 192  150 - 400 K/uL  GLUCOSE, CAPILLARY     Status: None   Collection Time    12/10/13  7:15 AM      Result Value Ref Range   Glucose-Capillary 87  70 - 99 mg/dL   Comment 1 Notify RN    TYPE AND SCREEN     Status: None   Collection Time    12/10/13  8:20 AM      Result Value Ref Range   ABO/RH(D) A POS     Antibody Screen NEG     Sample Expiration 12/13/2013     Unit Number [id number]     Blood Component Type RED CELLS,LR     Unit division 00     Status of Unit ISSUED     Transfusion Status OK TO TRANSFUSE     Crossmatch Result Compatible     Unit Number [id number]     Blood Component Type RED CELLS,LR     Unit division 00     Status of Unit ISSUED     Transfusion Status OK TO TRANSFUSE     Crossmatch Result Compatible    PREPARE RBC (CROSSMATCH)     Status: None   Collection Time    12/10/13  8:20 AM      Result Value Ref Range   Order Confirmation ORDER PROCESSED BY BLOOD BANK    ABO/RH     Status: None   Collection Time    12/10/13  8:20 AM      Result Value Ref Range   ABO/RH(D) A POS    GLUCOSE, CAPILLARY     Status: Abnormal   Collection Time    12/10/13 12:01 PM      Result Value Ref Range   Glucose-Capillary 128 (*) 70 - 99 mg/dL    Comment 1 Notify RN    GLUCOSE, CAPILLARY     Status: Abnormal   Collection Time    12/10/13  4:56 PM      Result Value Ref Range   Glucose-Capillary 194 (*) 70 - 99 mg/dL   Comment 1 Notify RN    HEMOGLOBIN AND HEMATOCRIT, BLOOD     Status: Abnormal   Collection Time    12/10/13  8:15 PM      Result Value Ref Range   Hemoglobin 9.6 (*) 12.0 - 15.0 g/dL  Comment: POST TRANSFUSION SPECIMEN   HCT 29.0 (*) 36.0 - 46.0 %  GLUCOSE, CAPILLARY     Status: Abnormal   Collection Time    12/10/13  8:49 PM      Result Value Ref Range   Glucose-Capillary 188 (*) 70 - 99 mg/dL   Comment 1 Notify RN    PROTIME-INR     Status: Abnormal   Collection Time    12/11/13  6:30 AM      Result Value Ref Range   Prothrombin Time 27.3 (*) 11.6 - 15.2 seconds   INR 2.64 (*) 0.00 - 1.49  RETICULOCYTES     Status: Abnormal   Collection Time    12/11/13  6:30 AM      Result Value Ref Range   Retic Ct Pct 2.1  0.4 - 3.1 %   RBC. 2.84 (*) 3.87 - 5.11 MIL/uL   Retic Count, Manual 59.6  19.0 - 186.0 K/uL  CBC     Status: Abnormal   Collection Time    12/11/13  6:30 AM      Result Value Ref Range   WBC 4.9  4.0 - 10.5 K/uL   RBC 2.84 (*) 3.87 - 5.11 MIL/uL   Hemoglobin 8.4 (*) 12.0 - 15.0 g/dL   HCT 56.2 (*) 13.0 - 86.5 %   MCV 88.7  78.0 - 100.0 fL   MCH 29.6  26.0 - 34.0 pg   MCHC 33.3  30.0 - 36.0 g/dL   RDW 78.4 (*) 69.6 - 29.5 %   Platelets 184  150 - 400 K/uL  GLUCOSE, CAPILLARY     Status: None   Collection Time    12/11/13  7:05 AM      Result Value Ref Range   Glucose-Capillary 97  70 - 99 mg/dL   Comment 1 Notify RN       HEENT: healing trach site with air leak General: No acute distress Mood and affect are appropriate Heart: Regular rate and rhythm no rubs murmurs or extra sounds Lungs: Clear to auscultation, breathing unlabored, no rales or wheezes Abdomen: Positive bowel sounds, soft nontender to palpation, nondistended. Extremities: No clubbing, cyanosis, or edema, G tube site  healed Skin: No evidence of breakdown, no evidence of rash Neurologic: Cranial nerves II through XII intact, motor strength is 3-/5 in bilateral deltoid, 4/5 bicep, tricep, grip, 3-/5 R hip and Knee flexor,4- knee extensors,3- Left Hip flexor  ankle dorsiflexor and plantar flexor    Musculoskeletal: Tenderness mild right Achilles  Assessment/Plan: 1. Functional deficits secondary to Critical illness myopathy,  encephalopathy which require 3+ hours per day of interdisciplinary therapy in a comprehensive inpatient rehab setting. Physiatrist is providing close team supervision and 24 hour management of active medical problems listed below. Physiatrist and rehab team continue to assess barriers to discharge/monitor patient progress toward functional and medical goals.  FIM: FIM - Bathing Bathing Steps Patient Completed: Chest;Right Arm;Left Arm;Abdomen;Right upper leg;Left upper leg;Front perineal area;Buttocks;Right lower leg (including foot);Left lower leg (including foot) Bathing: 5: Supervision: Safety issues/verbal cues  FIM - Upper Body Dressing/Undressing Upper body dressing/undressing steps patient completed: Thread/unthread right sleeve of pullover shirt/dresss;Thread/unthread left sleeve of pullover shirt/dress;Put head through opening of pull over shirt/dress;Pull shirt over trunk Upper body dressing/undressing: 5: Supervision: Safety issues/verbal cues FIM - Lower Body Dressing/Undressing Lower body dressing/undressing steps patient completed: Thread/unthread right underwear leg;Thread/unthread left underwear leg;Thread/unthread right pants leg;Thread/unthread left pants leg;Pull pants up/down;Pull underwear up/down;Don/Doff left sock;Don/Doff right sock;Don/Doff left shoe;Fasten/unfasten right  shoe;Fasten/unfasten left shoe;Don/Doff right shoe Lower body dressing/undressing: 5: Supervision: Safety issues/verbal cues  FIM - Toileting Toileting steps completed by patient: Adjust clothing  prior to toileting;Performs perineal hygiene;Adjust clothing after toileting Toileting Assistive Devices: Grab bar or rail for support Toileting: 6: More than reasonable amount of time  FIM - Diplomatic Services operational officerToilet Transfers Toilet Transfers Assistive Devices: Walker;Grab bars Toilet Transfers: 5-To toilet/BSC: Supervision (verbal cues/safety issues);5-From toilet/BSC: Supervision (verbal cues/safety issues)  FIM - Press photographerBed/Chair Transfer Bed/Chair Transfer Assistive Devices: Arm rests;Walker Bed/Chair Transfer: 0: Activity did not occur  FIM - Locomotion: Wheelchair Distance: 50 Locomotion: Wheelchair: 0: Activity did not occur FIM - Locomotion: Ambulation Locomotion: Ambulation Assistive Devices: Designer, industrial/productWalker - Rolling Ambulation/Gait Assistance: 5: Supervision Locomotion: Ambulation: 0: Activity did not occur  Comprehension Comprehension Mode: Auditory Comprehension: 5-Follows basic conversation/direction: With extra time/assistive device  Expression Expression Mode: Verbal Expression: 5-Expresses basic needs/ideas: With no assist  Social Interaction Social Interaction: 6-Interacts appropriately with others with medication or extra time (anti-anxiety, antidepressant).  Problem Solving Problem Solving: 5-Solves basic 90% of the time/requires cueing < 10% of the time  Memory Memory: 4-Recognizes or recalls 75 - 89% of the time/requires cueing 10 - 24% of the time   Medical Problem List and Plan:  1. 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 DVT Prophylaxis/Anticoagulation: Subcutaneous heparin discontinued and chronic Coumadin resumed 11/22/2013 without bridging. . Doppler showed possible Left popliteal DVT in a technically limited study, no progressive symptoms.  INR supratherapeutic 3. Pain Management: Oxycodone immediate release 5 mg every 6 hours as needed. Added kpad for right thigh 4. Mood: Clonazepam 0.5 mg 3 times a  day. positive reinforcement  5. Neuropsych: This patient is capable of making decisions on her own behalf.  6. 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 Shortness of breath today appears to be anxiety related, her RN the patient is due for some Klonopin 7. Dysphagia. Mechanical soft diet. Gastrostomy tube feeds on hold since admission Should be able to D/C PEG if INR<3 8. Diabetes mellitus. Glipizide will d/c  Checking blood sugars a.c. and at bedtime. Sugars low in am. D/C levemir 5/18 9. Hypertension/atrial fibrillation.Uncontrolled Lasix 40 mg daily, Resume Norvasc  Given HR in 80s increase Coreg 25mg  twice a day  10. Hypothyroidism. Tapazole 5 mg daily. Latest TSH level 2.230  11. Acute on Chronic anemia.  hgb 8.7 > 6.3 , check CT Abd and pelvis, Discussed with GI , no sign of active GI Bleed CT abd and pelvis without bleed Furhter hx from family, pt gets 2 transfusions per year , usually in April or May, on Procrit at home, repeat Hgb improved post transfusion 12. Obstructive sleep apnea. Resumed CPAP.--pt seems to be tolerating well 13.  Pt with G tube drainage,resolved , ask pharmacy to hold warfarin to allow INR to drift down to normal.  If INR stays elevated until D/C will need to resume and schedule outpt f/u with MD who inserted PEG, resume warfarin after PEG removal LOS (Days) 19 A FACE TO FACE EVALUATION WAS PERFORMED  Erick Colacendrew E Kirsteins 12/11/2013, 8:42 AM

## 2013-12-11 NOTE — Progress Notes (Signed)
ANTICOAGULATION CONSULT NOTE - Follow Up Consult  Pharmacy Consult for coumadin Indication: afib + hx of DVT  Allergies  Allergen Reactions  . Codeine Other (See Comments)    Pass out    . Peanut Butter Flavor Hives and Other (See Comments)    Lockjaw   . Penicillins Swelling    Patient Measurements: Height: 5' 2.99" (160 cm) Weight: 231 lb 11.3 oz (105.1 kg) IBW/kg (Calculated) : 52.38   Vital Signs: Temp: 98.1 F (36.7 C) (05/31 0555) Temp src: Oral (05/31 0555) BP: 187/78 mmHg (05/31 0555) Pulse Rate: 69 (05/31 0555)  Labs:  Recent Labs  12/09/13 0645  12/09/13 1502 12/10/13 0540 12/10/13 2015 12/11/13 0630  HGB  --   < > 7.2* 6.3* 9.6* 8.4*  HCT  --   < > 22.2* 20.1* 29.0* 25.2*  PLT  --   < > 173 192  --  184  LABPROT 29.2*  --   --  29.4*  --  27.3*  INR 2.89*  --   --  2.91*  --  2.64*  < > = values in this interval not displayed.  Estimated Creatinine Clearance: 28.4 ml/min (by C-G formula based on Cr of 2.29).   Medications:  Scheduled:  . amLODipine  5 mg Oral Daily  . atorvastatin  10 mg Oral q1800  . carvedilol  25 mg Oral BID WC  . clonazePAM  0.5 mg Oral BID  . darbepoetin (ARANESP) injection - NON-DIALYSIS  40 mcg Subcutaneous Q Sat-1800  . ferrous sulfate  325 mg Oral TID WC  . furosemide  40 mg Oral Daily  . lactobacillus  1 g Oral TID WC  . methimazole  5 mg Oral Daily  . nitroGLYCERIN  0.2 mg Transdermal Daily  . pantoprazole  40 mg Oral Q0600  . potassium chloride  40 mEq Oral Daily  . predniSONE  15 mg Oral Q breakfast  . senna-docusate  1 tablet Oral BID  . sodium bicarbonate  1,300 mg Oral BID  . timolol  1 drop Both Eyes BID  . vitamin C  500 mg Oral BID  . zinc sulfate  220 mg Oral Daily   Infusions:    Assessment: 67 YOF on warfarin for hx Afib/DVT with a therapeutic INR this morning after resuming despite holding for PEG removal - trending down (INR 2.64 << 2.91, goal of 2-3). Hgb up to 8.4 s/p 2 units  PRBC.  Bleeding around PEG site noted and plans are to hold warfarin to allow INR to trend down for removal.  Goal of Therapy:  INR 2-3 Monitor platelets by anticoagulation protocol: Yes   Plan:  1. Hold warfarin for now 2. Will continue to monitor for any signs/symptoms of bleeding, PT/INR, and plans to resume warfarin  Georgina Pillion, PharmD, BCPS Clinical Pharmacist Pager: [phone] 12/11/2013 12:36 PM

## 2013-12-11 NOTE — Progress Notes (Signed)
Patient HBG level resulted 9.6, Aranesp 40 mg injection given at 2125 per pharmacy order to give if HGB less than 10. Cont plan of care. Phill Mutter Drue Camera

## 2013-12-11 NOTE — Progress Notes (Signed)
Occupational Therapy Session Note  Patient Details  Name: Sarah Tran MRN: [id number] Date of Birth: 1945/12/02  Today's Date: 12/11/2013 Time: 1135-1200 Time Calculation (min): 25 min  Short Term Goals: Week 1:  OT Short Term Goal 1 (Week 1): Pt will be able to stand at EOB with RW for 1 min with mod A to be able to pull pants over hips. OT Short Term Goal 1 - Progress (Week 1): Met OT Short Term Goal 2 (Week 1): Pt will don shirt with min A. OT Short Term Goal 2 - Progress (Week 1): Met OT Short Term Goal 3 (Week 1): Pt will demonstrate improved BUE shoulder AROM to lift her arms to be able to wash under them without assist. OT Short Term Goal 3 - Progress (Week 1): Met OT Short Term Goal 4 (Week 1): Pt will transfer to drop arm BSC with mod A x1.  OT Short Term Goal 4 - Progress (Week 1): Met  Week 2:  OT Short Term Goal 1 (Week 2): Pt will be able to ambulate in and out of bathroom with RW with steady A.  OT Short Term Goal 1 - Progress (Week 2): Met OT Short Term Goal 2 (Week 2): Pt will be able to toilet with mod I.  OT Short Term Goal 2 - Progress (Week 2): Met OT Short Term Goal 3 (Week 2): Pt will be able to dress LB with set up except for shoes and socks. OT Short Term Goal 3 - Progress (Week 2): Met OT Short Term Goal 4 (Week 2): Pt will be able to transfer on and off a tub bench (simulated with clothing on) with min A. OT Short Term Goal 4 - Progress (Week 2): Met  Week 3:  OT Short Term Goal 1 (Week 3): STGs = LTGs   Skilled Therapeutic Interventions/Progress Updates:  No complaints of pain Patient received supine in bed with family present. Therapist took measurements of BLEs, measurements decreased since last time (see below). Therapist also educated family on donning and doffing of compression socks and their wearing schedule. Patient's brother is independent to assist patient with application of socks. Therapist donned lotion > patient's BLEs prior to donning socks.  Left patient supine in bed with all needs within reach and family at bedside.   Previous measurements from 5/28: Left: Ankle=31.5 cm, Calf=57cm and Right: Ankle=32cm, Calf=53.5cm.  Measurements today: Left Ankle=26cm, Calf=50cm and Right Ankle=26cm, Calf=47cm  Precautions:  Precautions Precautions: Fall Precaution Comments: Pt with colostomy, PEG, mid abdominal wound site, healing trach site with small air leak; previous R TKA Restrictions Weight Bearing Restrictions: No  See FIM for current functional status  Therapy/Group: Individual Therapy  Estelle June 12/11/2013, 12:04 PM

## 2013-12-12 ENCOUNTER — Inpatient Hospital Stay (HOSPITAL_COMMUNITY): Payer: Medicare Other | Admitting: Speech Pathology

## 2013-12-12 ENCOUNTER — Inpatient Hospital Stay (HOSPITAL_COMMUNITY): Payer: Medicare Other

## 2013-12-12 ENCOUNTER — Encounter (HOSPITAL_COMMUNITY): Payer: Medicare Other | Admitting: Occupational Therapy

## 2013-12-12 DIAGNOSIS — N289 Disorder of kidney and ureter, unspecified: Secondary | ICD-10-CM

## 2013-12-12 DIAGNOSIS — E119 Type 2 diabetes mellitus without complications: Secondary | ICD-10-CM

## 2013-12-12 DIAGNOSIS — R5381 Other malaise: Secondary | ICD-10-CM

## 2013-12-12 DIAGNOSIS — J9620 Acute and chronic respiratory failure, unspecified whether with hypoxia or hypercapnia: Secondary | ICD-10-CM

## 2013-12-12 LAB — GLUCOSE, CAPILLARY: GLUCOSE-CAPILLARY: 89 mg/dL (ref 70–99)

## 2013-12-12 LAB — PROTIME-INR
INR: 2.14 — AB (ref 0.00–1.49)
Prothrombin Time: 23.2 seconds — ABNORMAL HIGH (ref 11.6–15.2)

## 2013-12-12 MED ORDER — AMLODIPINE BESYLATE 10 MG PO TABS
10.0000 mg | ORAL_TABLET | Freq: Every day | ORAL | Status: DC
  Filled 2013-12-12: qty 1

## 2013-12-12 MED ORDER — METHIMAZOLE 5 MG PO TABS: 5.0000 mg | ORAL_TABLET | Freq: Every day | ORAL | Status: AC

## 2013-12-12 MED ORDER — CLONAZEPAM 0.5 MG PO TABS: 0.5000 mg | ORAL_TABLET | Freq: Two times a day (BID) | ORAL | Status: AC

## 2013-12-12 MED ORDER — WARFARIN SODIUM 2 MG PO TABS: 2.0000 mg | ORAL_TABLET | Freq: Every day | ORAL | Status: DC

## 2013-12-12 MED ORDER — SODIUM BICARBONATE 650 MG PO TABS: 1300.0000 mg | ORAL_TABLET | Freq: Two times a day (BID) | ORAL | Status: AC

## 2013-12-12 MED ORDER — POTASSIUM CHLORIDE CRYS ER 20 MEQ PO TBCR: 40.0000 meq | EXTENDED_RELEASE_TABLET | Freq: Every day | ORAL | Status: AC

## 2013-12-12 MED ORDER — NITROGLYCERIN 0.2 MG/HR TD PT24: 0.2000 mg | MEDICATED_PATCH | Freq: Every day | TRANSDERMAL | Status: AC

## 2013-12-12 MED ORDER — ALBUTEROL SULFATE HFA 108 (90 BASE) MCG/ACT IN AERS: 2.0000 | INHALATION_SPRAY | RESPIRATORY_TRACT | Status: AC | PRN

## 2013-12-12 MED ORDER — WARFARIN SODIUM 2 MG PO TABS
2.0000 mg | ORAL_TABLET | Freq: Every day | ORAL | Status: DC
  Filled 2013-12-12: qty 1

## 2013-12-12 MED ORDER — ZINC SULFATE 220 (50 ZN) MG PO CAPS: 220.0000 mg | ORAL_CAPSULE | Freq: Every day | ORAL | Status: AC

## 2013-12-12 MED ORDER — DARBEPOETIN ALFA-POLYSORBATE 40 MCG/0.4ML IJ SOLN: 40.0000 ug | INTRAMUSCULAR | Status: DC

## 2013-12-12 MED ORDER — FERROUS SULFATE 325 (65 FE) MG PO TABS: 325.0000 mg | ORAL_TABLET | Freq: Three times a day (TID) | ORAL | Status: DC

## 2013-12-12 MED ORDER — ALBUTEROL SULFATE (2.5 MG/3ML) 0.083% IN NEBU: 2.5000 mg | INHALATION_SOLUTION | RESPIRATORY_TRACT | Status: DC | PRN

## 2013-12-12 MED ORDER — ATORVASTATIN CALCIUM 20 MG PO TABS: 20.0000 mg | ORAL_TABLET | Freq: Every day | ORAL | Status: DC

## 2013-12-12 MED ORDER — FLORANEX PO PACK: 1.0000 g | PACK | Freq: Three times a day (TID) | ORAL | Status: DC

## 2013-12-12 MED ORDER — WARFARIN - PHARMACIST DOSING INPATIENT: Freq: Every day | Status: DC

## 2013-12-12 MED ORDER — CARVEDILOL 25 MG PO TABS: 25.0000 mg | ORAL_TABLET | Freq: Two times a day (BID) | ORAL | Status: AC

## 2013-12-12 MED ORDER — PANTOPRAZOLE SODIUM 40 MG PO TBEC: 40.0000 mg | DELAYED_RELEASE_TABLET | Freq: Every day | ORAL | Status: AC

## 2013-12-12 MED ORDER — FUROSEMIDE 40 MG PO TABS: 40.0000 mg | ORAL_TABLET | Freq: Every day | ORAL | Status: AC

## 2013-12-12 MED ORDER — ALBUTEROL SULFATE (2.5 MG/3ML) 0.083% IN NEBU: 3.0000 mL | INHALATION_SOLUTION | RESPIRATORY_TRACT | Status: DC | PRN

## 2013-12-12 MED ORDER — ASCORBIC ACID 500 MG PO TABS: 500.0000 mg | ORAL_TABLET | Freq: Two times a day (BID) | ORAL | Status: DC

## 2013-12-12 MED ORDER — AMLODIPINE BESYLATE 10 MG PO TABS: 10.0000 mg | ORAL_TABLET | Freq: Every day | ORAL | Status: AC

## 2013-12-12 NOTE — Discharge Summary (Signed)
Discharge summary job [id number]

## 2013-12-12 NOTE — Consult Note (Signed)
NEUROCOGNITIVE STATUS EXAMINATION - CONFIDENTIAL [facility]   Ms. Sarah Tran is a 68 year old, right-handed, African American woman, who was seen for a brief neurocognitive status examination to evaluate her emotional state and mental status.  According to her medical record, she was admitted on 09/28/13 for removal of polyp found during colonoscopy.  However, she suffered perioperative complications when the balloon catheter became dislodged and she required exploratory laparotomy and diverting colostomy as well as appendectomy secondary to perforated colon.  She experienced respiratory failure requiring intubation and eventually tracheotomy as well as gastrostomy tube for feeding support.  She remained in intensive care for 28 days.  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  She was found to experience profound deconditioning and was admitted for comprehensive rehabilitation.    Emotional Functioning:  During the clinical interview, Sarah Tran described her mood as generally "pretty good" and stated that she is dealing with her prolonged hospitalization "quite well."  She acknowledged concern over not fully knowing what happened to her (her last clear memory is going in for colonoscopy and she does not have any new memories until she came to the inpatient rehabilitation unit).  While she said that her physicians have explained what medically happened to her to get her to this point, she lacks awareness of her personal experience.  She anticipates that her family will fill her in on relevant details next Saturday after she has been home for a few days and while she wants to know what happened, she is "a little scared" to learn about how serious her condition may have been.  She commented that she tries hard to focus on being grateful for what she has and on  moving forward.  Sarah Tran admitted that there are fleeting moments (up to 10 minutes) when she feels discouraged, frustrated, and low, but she uses prayer and she looks at pictures of her family during those times to find inspiration.  These coping methods have purportedly worked well for her so far.  Sarah Tran said that she has a strong support network and although it will be uncomfortable to have to ask for help doing things with which she was previously independent, she is "confident" that she will be able to ask for help and that they will help her.  Ms. Meas' final concern was regarding the timeline that it will take for her to resume independence.  Time was spent discussing recovery and uncertainty in timelines for such recovery.  Sarah Tran denied major symptoms of depression and anxiety, including suicidal ideation.  Her responses to self-report measures of mood symptoms were not suggestive of the presence of clinically significant depression or anxiety at this time.    Mental Status:  Ms. Rylant' total score on an overall measure of mental status was suggestive of the presence of significant cognitive impairment, bordering on dementia at this time (MMSE-2 brief = 12/16).  She lost points for failure to recall 2 of 3 previously studied words, and for misstating the county and the floor of the building that she is on.  Subjectively, she reported noticing difficulty recalling names and slowed processing speed.  She also stated that she sometimes has mild trouble with word-finding.    Impressions and Recommendations:  Ms.  Burnside' overall neurocognitive profile was suggestive of the presence of significant cognitive impairment, bordering on dementia.  However, she has  several situational factors that could be influencing her cognitive abilities at this time (e.g. fatigue, pain, stress, etc.).  It is likely that when those factors resolve, that her cognitive functioning will improve as well.  However, if her  cognitive abilities do not improve as situational factors resolve or if she notices dramatic worsening of cognitive symptoms, she could present for a comprehensive neuropsychological evaluation as an outpatient.  Contact information for a local neuropsychologist should be provided for that purpose.  From an emotional standpoint, Sarah Tran seems to be coping well with her situation.  While she has fleeting moments of depressed mood, there is no evidence to suggest the presence of a pervasive underlying psychiatric condition.    DIAGNOSES: Physical deconditioning Neurocognitive disorder, NOS  Sarah Tran, Psy.D.  Clinical Neuropsychologist

## 2013-12-12 NOTE — Progress Notes (Signed)
Occupational Therapy Session Note  Patient Details  Name: Sarah Tran MRN: [id number] Date of Birth: 01-26-1946  Today's Date: 12/12/2013 Time: 0800-0853 Time Calculation (min): 53 min  Short Term Goals: Week 1:  OT Short Term Goal 1 (Week 1): Pt will be able to stand at EOB with RW for 1 min with mod A to be able to pull pants over hips. OT Short Term Goal 1 - Progress (Week 1): Met OT Short Term Goal 2 (Week 1): Pt will don shirt with min A. OT Short Term Goal 2 - Progress (Week 1): Met OT Short Term Goal 3 (Week 1): Pt will demonstrate improved BUE shoulder AROM to lift her arms to be able to wash under them without assist. OT Short Term Goal 3 - Progress (Week 1): Met OT Short Term Goal 4 (Week 1): Pt will transfer to drop arm BSC with mod A x1.  OT Short Term Goal 4 - Progress (Week 1): Met Week 2:  OT Short Term Goal 1 (Week 2): Pt will be able to ambulate in and out of bathroom with RW with steady A.  OT Short Term Goal 1 - Progress (Week 2): Met OT Short Term Goal 2 (Week 2): Pt will be able to toilet with mod I.  OT Short Term Goal 2 - Progress (Week 2): Met OT Short Term Goal 3 (Week 2): Pt will be able to dress LB with set up except for shoes and socks. OT Short Term Goal 3 - Progress (Week 2): Met OT Short Term Goal 4 (Week 2): Pt will be able to transfer on and off a tub bench (simulated with clothing on) with min A. OT Short Term Goal 4 - Progress (Week 2): Met Week 3:  OT Short Term Goal 1 (Week 3): STGs = LTGs   Skilled Therapeutic Interventions/Progress Updates:      Pt seen for BADL retraining of toileting, bathing, and dressing with a focus on activity tolerance, functional mobility, and family education with patient's daughter.  Pt completed all of her self care with distant supervision to mod I.  Pt bathed in bathroom, but did not shower.  Pt was able to complete all tasks efficiently.  Her daughter was present and observed how independent she has become, but pt  continues to need  S with her transfers as she often gets in a hurry and does not always move safely.  Educated daughter that pt does need min A with tub bench transfer in tub.  Pt resting in w.c in room with daughter in her room preparing her mother for discharge today.  Therapy Documentation Precautions:  Precautions Precautions: Fall Precaution Comments: Pt with colostomy, PEG, mid abdominal wound site, healing trach site with small air leak; previous R TKA Restrictions Weight Bearing Restrictions: No      Pain: Pain Assessment Pain Assessment: No/denies pain ADL: ADL ADL Comments: Refer to FIM   See FIM for current functional status  Therapy/Group: Individual Therapy  Geoffery Lyons Saguier 12/12/2013, 11:54 AM

## 2013-12-12 NOTE — Progress Notes (Signed)
Social Work Discharge Note Discharge Note  The overall goal for the admission was met for:   Discharge location: [place]  Length of Stay: Yes-20 DAYS  Discharge activity level: Yes-SUPERVISION/MIN LEVEL  Home/community participation: Yes  Services provided included: MD, RD, PT, OT, SLP, RN, TR, Pharmacy, Neuropsych and SW  Financial Services: Medicare  Follow-up services arranged: Home Health: [facility]-PT,OT,RN,SPT, DME: [facility]-WHEELCHAIR, TUB BENCH, WIDE ROLLING WALKER and Patient/Family has no preference for HH/DME agencies  Comments (or additional information):FAMILY [place]. ALL FEEL READY FOR PT'S DISCHARGE.  Patient/Family verbalized understanding of follow-up arrangements: Yes  Individual responsible for coordination of the follow-up plan: KATRINA-DAUGHTER  Confirmed correct DME delivered: Elease Hashimoto 12/12/2013    Sarah Tran

## 2013-12-12 NOTE — Progress Notes (Signed)
Physical Therapy Discharge Summary  Patient Details  Name: Sarah Tran MRN: [id number] Date of Birth: 08-29-1945  Today's Date: 12/12/2013 Time: 1000-1045 Time Calculation (min): 45 min  Patient has met 8 of 8 long term goals due to improved activity tolerance, improved balance, increased strength, decreased pain, ability to compensate for deficits, functional use of  right upper extremity, right lower extremity, left upper extremity and left lower extremity and improved awareness.  Patient to discharge at an ambulatory level Supervision.   Patient's care partner is independent to provide the necessary cognitive assistance at discharge.  Reasons goals not met: n/a  Recommendation:  Patient will benefit from ongoing skilled PT services in home health setting to continue to advance safe functional mobility, address ongoing impairments in activity tolerance, balance, edema, strength, functional mobility, and minimize fall risk.  Equipment: 22' wide rental w/c and cushion; family purchased wide RW  Reasons for discharge: treatment goals met and discharge from hospital  Patient/family agrees with progress made and goals achieved: Yes  PT Discharge Precautions/Restrictions- none       Vision/Perception- no change  Pain- none reported     Cognition Overall Cognitive Status: Impaired/Different from baseline Arousal/Alertness: Awake/alert Orientation Level: Oriented X4 Memory: Impaired Memory Impairment: Decreased recall of new information Speed of processing has improved since admission; pt followed 3 step command with mild delay.  Pt occasionally needs a cue for best positioning of RW for transfer; when experiencing bladder urgency, she sometimes requires 1 cue to move away bil legrests of w/c. Sensation Sensation Light Touch: Appears Intact Proprioception: Appears Intact Coordination Coordination and Movement Description: Movements are slow and labored due to weakness. Heel Shin  Test: able to cross LEs,  and initiate movement with limited excursion due to weakness Motor  Motor Motor: Motor impersistence Motor - Discharge Observations: improved strength bil LEs  Mobility Bed Mobility Bed Mobility:  (modified independent for all; uses leg lifter strap for RLE) Transfers Transfers: Yes Stand Pivot Transfers: 5: Supervision Stand Pivot Transfer Details (indicate cue type and reason): with or without AD Locomotion  Ambulation Ambulation: Yes Ambulation/Gait Assistance: 5: Supervision Ambulation Distance (Feet): 150 Feet Assistive device: Rolling walker Ambulation/Gait Assistance Details: Verbal cues for technique Gait Gait: Yes Gait Pattern: Impaired Gait Pattern: Decreased hip/knee flexion - left;Decreased hip/knee flexion - right;Decreased dorsiflexion - right;Decreased dorsiflexion - left;Decreased stride length;Step-through pattern Gait velocity: slow High Level Ambulation High Level Ambulation: Backwards walking;Side stepping Stairs / Additional Locomotion Stairs: No Wheelchair Mobility Wheelchair Mobility: Yes Wheelchair Assistance: 5: Careers information officer: Both upper extremities Wheelchair Parts Management: Needs assistance Distance: 50  Trunk/Postural Assessment  Cervical Assessment Cervical Assessment: Within Functional Limits Thoracic Assessment Thoracic Assessment: Within Functional Limits Lumbar Assessment Lumbar Assessment: Within Functional Limits Postural Control Postural Control: Within Functional Limits  Balance Balance Balance Assessed: Yes Static Sitting Balance Static Sitting - Level of Assistance: 7: Independent Dynamic Sitting Balance Dynamic Sitting - Level of Assistance: 7: Independent Static Standing Balance Static Standing - Level of Assistance: 6: Modified independent (Device/Increase time) Dynamic Standing Balance Dynamic Standing - Level of Assistance: Other (comment) (absent ankle strategy bil,  delayed calf strategy bil during external perturbations) Extremity Assessment      RLE Assessment RLE Assessment:  (mod/severe non pitting edema) RLE Strength RLE Overall Strength Comments: grossly in sitting: 3-/5 hip flexion, 4/5 hip add/abd 4+/5 knee ext/ flex, 3+5 ankle DF LLE Assessment LLE Assessment: Exceptions to WFL (mod/severe non pitting edema) LLE Strength LLE Overall Strength Comments: grossly in sitting, 4-/5  hip flex, 4/5 hip add/abd, 4-/5 knee ext/flex, ankle DF  See FIM for current functional status  Frederic Jericho 12/12/2013, 5:37 PM

## 2013-12-12 NOTE — Consult Note (Signed)
WOC ostomy follow up Stoma type/location: LLQ, end colosotmy Stomal assessment/size: 1" oval, cut at 1 1/8" Output pasty, brown Ostomy pouching: 2pc. 2 1/4" with 2" barrier ring. Education provided: pt independent with ostomy care including emptying and pouch change. Daughter also knowledgeable of ostomy care. Enrolled patient in [facility] Discharge program: Yes, precut pouches sent as samples for patient and [employer] catalog provided to the patient to order her supplies.  WOC will sign off today, pt pending dc home today.  Trinitie Mcgirr [place] RN,CWOCN [phone]

## 2013-12-12 NOTE — Progress Notes (Signed)
Speech Language Pathology Discharge Summary  Patient Details  Name: Sarah Tran MRN: [id number] Date of Birth: 10/17/1945  Today's Date: 12/12/2013 Time: 0915-0930 Time Calculation (min): 15 min  Skilled Therapeutic Interventions:  Pt was seen for skilled speech therapy targeting family education prior to discharge.  Pt was upright in wheelchair with family present preparing to take pt home.  SLP reviewed recommendations for assist for medication and finances as well as follow up speech therapy upon discharge.  SLP also provided pt with a list of cognitive reorganization activities as a resource for continued cognitive remediation and compensation in the home environment.  All pt and pt's family's questions were addressed at this time and pt will discharge home with 24/7 supervision     Patient has met 7 of 7 long term goals.  Patient to discharge at overall Supervision;Min level.  Reasons goals not met: n/a   Clinical Impression/Discharge Summary: Pt has made good gains during CIR admission and has met 7 out of 7 long term goals.  Pt is to discharge at an overall supervision level for basic tasks and information, min assist for complex tasks and information.  Pt continues to benefit from min cuing for short term memory due to decreased recall of new, complex information and decreased prospective memory.  Additionally pt benefits from min cuing for executive function due to planning, thought organization and error awareness impairments which are largelty related to working memory deficits and delayed processing.  Pt would benefit from continued speech therapy upon discharge to maximize functional independence and reduce burden of care in her home environment.  Pt would also benefit from supervision for safety awareness and assist for medication and financial management.    Care Partner:  Caregiver Able to Provide Assistance: Yes  Type of Caregiver Assistance: Physical;Cognitive  Recommendation:   Home Health SLP;24 hour supervision/assistance  Rationale for SLP Follow Up: Maximize cognitive function and independence;Reduce caregiver burden   Equipment:  None recommended by speech therapist   Reasons for discharge: Discharged from hospital   Patient/Family Agrees with Progress Made and Goals Achieved: Yes   See FIM for current functional status  Windell Moulding, M.A. CCC-SLP  Selinda Orion Ginnette Gates 12/12/2013, 12:17 PM

## 2013-12-12 NOTE — Progress Notes (Addendum)
Subjective/Complaints: "can I go home?" Was on procrit at home  GI rec INR "near normal for PEG to be pulled  No dizziness in PT yesterday  Review of Systems - otherwise negative Objective: Vital Signs: Blood pressure 187/107, pulse 78, temperature 98.3 F (36.8 C), temperature source Oral, resp. rate 18, height 5' 2.99" (1.6 m), weight 105.1 kg (231 lb 11.3 oz), SpO2 97.00%. Ct Abdomen Pelvis Wo Contrast  12/10/2013   CLINICAL DATA:  Anemia, evaluate for retroperitoneal bleed/hematoma, on Coumadin  EXAM: CT ABDOMEN AND PELVIS WITHOUT CONTRAST  TECHNIQUE: Multidetector CT imaging of the abdomen and pelvis was performed following the standard protocol without IV contrast.  COMPARISON:  11/28/2013  FINDINGS: Motion degraded images.  Mild nodular scarring at the right lung base.  Moderate hiatal hernia.  Gastrostomy tube in satisfactory position.  Unenhanced liver, spleen, pancreas, and left adrenal gland are within normal limits. 3.7 x 2.5 cm right adrenal adenoma , benign.  Status post cholecystectomy. No intrahepatic or extrahepatic ductal dilatation.  Bilateral renal cysts, including a 4.7 x 5.3 cm left anterior interpolar cyst and a 1.9 x 2.1 cm posterior interpolar right renal cyst (series 201/image 32). No renal calculi or hydronephrosis.  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 Colonic diverticulosis, without associated inflammatory changes.  No evidence of abdominal aortic aneurysm.  No abdominopelvic ascites.  No suspicious abdominopelvic lymphadenopathy.  Status post hysterectomy.  No adnexal masses.  Bladder is underdistended but unremarkable.  Postsurgical changes involving the midline anterior abdominal wall.  No evidence of a retroperitoneal hematoma.  Degenerative changes of the visualized thoracolumbar spine. Mild to moderate degenerative changes of the bilateral hips, right greater than left.  IMPRESSION: No evidence of retroperitoneal  hematoma.  No interval change from recent CT.   Electronically Signed   By: Charline Bills M.D.   On: 12/10/2013 09:36   Results for orders placed during the hospital encounter of 11/22/13 (from the past 72 hour(s))  CBC WITH DIFFERENTIAL     Status: Abnormal   Collection Time    12/09/13  9:19 AM      Result Value Ref Range   WBC 4.9  4.0 - 10.5 K/uL   RBC 2.42 (*) 3.87 - 5.11 MIL/uL   Hemoglobin 7.1 (*) 12.0 - 15.0 g/dL   HCT 59.1 (*) 63.8 - 46.6 %   MCV 92.6  78.0 - 100.0 fL   MCH 29.3  26.0 - 34.0 pg   MCHC 31.7  30.0 - 36.0 g/dL   RDW 59.9 (*) 35.7 - 01.7 %   Platelets 171  150 - 400 K/uL   Neutrophils Relative % 69  43 - 77 %   Neutro Abs 3.4  1.7 - 7.7 K/uL   Lymphocytes Relative 22  12 - 46 %   Lymphs Abs 1.1  0.7 - 4.0 K/uL   Monocytes Relative 7  3 - 12 %   Monocytes Absolute 0.3  0.1 - 1.0 K/uL   Eosinophils Relative 2  0 - 5 %   Eosinophils Absolute 0.1  0.0 - 0.7 K/uL   Basophils Relative 0  0 - 1 %   Basophils Absolute 0.0  0.0 - 0.1 K/uL  GLUCOSE, CAPILLARY     Status: Abnormal   Collection Time    12/09/13 11:28 AM      Result Value Ref Range   Glucose-Capillary 154 (*) 70 - 99 mg/dL   Comment 1 Notify RN    CBC  Status: Abnormal   Collection Time    12/09/13  3:02 PM      Result Value Ref Range   WBC 4.8  4.0 - 10.5 K/uL   RBC 2.40 (*) 3.87 - 5.11 MIL/uL   Hemoglobin 7.2 (*) 12.0 - 15.0 g/dL   HCT 21.3 (*) 08.6 - 57.8 %   MCV 92.5  78.0 - 100.0 fL   MCH 30.0  26.0 - 34.0 pg   MCHC 32.4  30.0 - 36.0 g/dL   RDW 46.9 (*) 62.9 - 52.8 %   Platelets 173  150 - 400 K/uL  GLUCOSE, CAPILLARY     Status: Abnormal   Collection Time    12/09/13  4:54 PM      Result Value Ref Range   Glucose-Capillary 144 (*) 70 - 99 mg/dL   Comment 1 Notify RN    GLUCOSE, CAPILLARY     Status: Abnormal   Collection Time    12/09/13  9:35 PM      Result Value Ref Range   Glucose-Capillary 136 (*) 70 - 99 mg/dL  PROTIME-INR     Status: Abnormal   Collection Time     12/10/13  5:40 AM      Result Value Ref Range   Prothrombin Time 29.4 (*) 11.6 - 15.2 seconds   INR 2.91 (*) 0.00 - 1.49  CBC     Status: Abnormal   Collection Time    12/10/13  5:40 AM      Result Value Ref Range   WBC 4.0  4.0 - 10.5 K/uL   RBC 2.15 (*) 3.87 - 5.11 MIL/uL   Hemoglobin 6.3 (*) 12.0 - 15.0 g/dL   Comment: REPEATED TO VERIFY     CRITICAL RESULT CALLED TO, READ BACK BY AND VERIFIED WITH:     STEPHINE TURNER RN [phone] GREEN R   HCT 20.1 (*) 36.0 - 46.0 %   MCV 93.5  78.0 - 100.0 fL   MCH 29.3  26.0 - 34.0 pg   MCHC 31.3  30.0 - 36.0 g/dL   RDW 01.0 (*) 27.2 - 53.6 %   Platelets 192  150 - 400 K/uL  GLUCOSE, CAPILLARY     Status: None   Collection Time    12/10/13  7:15 AM      Result Value Ref Range   Glucose-Capillary 87  70 - 99 mg/dL   Comment 1 Notify RN    TYPE AND SCREEN     Status: None   Collection Time    12/10/13  8:20 AM      Result Value Ref Range   ABO/RH(D) A POS     Antibody Screen NEG     Sample Expiration 12/13/2013     Unit Number [id number]     Blood Component Type RED CELLS,LR     Unit division 00     Status of Unit ISSUED,FINAL     Transfusion Status OK TO TRANSFUSE     Crossmatch Result Compatible     Unit Number [id number]     Blood Component Type RED CELLS,LR     Unit division 00     Status of Unit ISSUED,FINAL     Transfusion Status OK TO TRANSFUSE     Crossmatch Result Compatible    PREPARE RBC (CROSSMATCH)     Status: None   Collection Time    12/10/13  8:20 AM      Result Value Ref Range   Order Confirmation ORDER  PROCESSED BY BLOOD BANK    ABO/RH     Status: None   Collection Time    12/10/13  8:20 AM      Result Value Ref Range   ABO/RH(D) A POS    GLUCOSE, CAPILLARY     Status: Abnormal   Collection Time    12/10/13 12:01 PM      Result Value Ref Range   Glucose-Capillary 128 (*) 70 - 99 mg/dL   Comment 1 Notify RN    GLUCOSE, CAPILLARY     Status: Abnormal   Collection Time    12/10/13  4:56 PM       Result Value Ref Range   Glucose-Capillary 194 (*) 70 - 99 mg/dL   Comment 1 Notify RN    HEMOGLOBIN AND HEMATOCRIT, BLOOD     Status: Abnormal   Collection Time    12/10/13  8:15 PM      Result Value Ref Range   Hemoglobin 9.6 (*) 12.0 - 15.0 g/dL   Comment: POST TRANSFUSION SPECIMEN   HCT 29.0 (*) 36.0 - 46.0 %  GLUCOSE, CAPILLARY     Status: Abnormal   Collection Time    12/10/13  8:49 PM      Result Value Ref Range   Glucose-Capillary 188 (*) 70 - 99 mg/dL   Comment 1 Notify RN    PROTIME-INR     Status: Abnormal   Collection Time    12/11/13  6:30 AM      Result Value Ref Range   Prothrombin Time 27.3 (*) 11.6 - 15.2 seconds   INR 2.64 (*) 0.00 - 1.49  VITAMIN B12     Status: None   Collection Time    12/11/13  6:30 AM      Result Value Ref Range   Vitamin B-12 557  211 - 911 pg/mL   Comment: Performed at [facility]  FOLATE     Status: None   Collection Time    12/11/13  6:30 AM      Result Value Ref Range   Folate 17.6     Comment: (NOTE)     Reference Ranges            Deficient:       0.4 - 3.3 ng/mL            Indeterminate:   3.4 - 5.4 ng/mL            Normal:              > 5.4 ng/mL     Performed at [facility]  IRON AND TIBC     Status: Abnormal   Collection Time    12/11/13  6:30 AM      Result Value Ref Range   Iron 55  42 - 135 ug/dL   TIBC 161 (*) 096 - 045 ug/dL   Saturation Ratios 30  20 - 55 %   UIBC 128  125 - 400 ug/dL   Comment: Performed at [facility]  FERRITIN     Status: Abnormal   Collection Time    12/11/13  6:30 AM      Result Value Ref Range   Ferritin 463 (*) 10 - 291 ng/mL   Comment: Performed at [facility]  RETICULOCYTES     Status: Abnormal   Collection Time    12/11/13  6:30 AM      Result Value Ref Range   Retic Ct  Pct 2.1  0.4 - 3.1 %   RBC. 2.84 (*) 3.87 - 5.11 MIL/uL   Retic Count, Manual 59.6  19.0 - 186.0 K/uL  CBC     Status: Abnormal   Collection Time    12/11/13  6:30 AM       Result Value Ref Range   WBC 4.9  4.0 - 10.5 K/uL   RBC 2.84 (*) 3.87 - 5.11 MIL/uL   Hemoglobin 8.4 (*) 12.0 - 15.0 g/dL   HCT 21.3 (*) 08.6 - 57.8 %   MCV 88.7  78.0 - 100.0 fL   MCH 29.6  26.0 - 34.0 pg   MCHC 33.3  30.0 - 36.0 g/dL   RDW 46.9 (*) 62.9 - 52.8 %   Platelets 184  150 - 400 K/uL  GLUCOSE, CAPILLARY     Status: None   Collection Time    12/11/13  7:05 AM      Result Value Ref Range   Glucose-Capillary 97  70 - 99 mg/dL   Comment 1 Notify RN    GLUCOSE, CAPILLARY     Status: Abnormal   Collection Time    12/11/13 11:31 AM      Result Value Ref Range   Glucose-Capillary 162 (*) 70 - 99 mg/dL   Comment 1 Notify RN    GLUCOSE, CAPILLARY     Status: Abnormal   Collection Time    12/11/13  4:58 PM      Result Value Ref Range   Glucose-Capillary 214 (*) 70 - 99 mg/dL  GLUCOSE, CAPILLARY     Status: Abnormal   Collection Time    12/11/13  8:56 PM      Result Value Ref Range   Glucose-Capillary 124 (*) 70 - 99 mg/dL  GLUCOSE, CAPILLARY     Status: None   Collection Time    12/12/13  7:14 AM      Result Value Ref Range   Glucose-Capillary 89  70 - 99 mg/dL   Comment 1 Notify RN       HEENT: healing trach site with air leak General: No acute distress Mood and affect are appropriate Heart: Regular rate and rhythm no rubs murmurs or extra sounds Lungs: Clear to auscultation, breathing unlabored, no rales or wheezes Abdomen: Positive bowel sounds, soft nontender to palpation, nondistended. Extremities: No clubbing, cyanosis, or edema, G tube site healed Skin: No evidence of breakdown, no evidence of rash Neurologic: Cranial nerves II through XII intact, motor strength is 3-/5 in bilateral deltoid, 4/5 bicep, tricep, grip, 3-/5 R hip and Knee flexor,4- knee extensors,3- Left Hip flexor  ankle dorsiflexor and plantar flexor    Musculoskeletal: Tenderness mild right Achilles  Assessment/Plan: 1. Functional deficits secondary to Critical illness myopathy,   encephalopathy which require 3+ hours per day of interdisciplinary therapy in a comprehensive inpatient rehab setting. Stable for D/C today F/u PCP in 1-2 weeks F/u PM&R 3 weeks F/u Gen surgery at [facility] See D/C summary See D/C instructions  FIM: FIM - Bathing Bathing Steps Patient Completed: Chest;Right Arm;Left Arm;Abdomen;Front perineal area;Buttocks;Right upper leg;Left upper leg;Right lower leg (including foot);Left lower leg (including foot) Bathing: 5: Supervision: Safety issues/verbal cues  FIM - Upper Body Dressing/Undressing Upper body dressing/undressing steps patient completed: Thread/unthread right sleeve of pullover shirt/dresss;Thread/unthread left sleeve of pullover shirt/dress;Put head through opening of pull over shirt/dress;Pull shirt over trunk Upper body dressing/undressing: 5: Supervision: Safety issues/verbal cues FIM - Lower Body Dressing/Undressing Lower body dressing/undressing steps patient completed: Thread/unthread  right underwear leg;Thread/unthread left underwear leg;Thread/unthread right pants leg;Thread/unthread left pants leg;Pull pants up/down;Pull underwear up/down;Don/Doff left sock;Don/Doff right sock;Don/Doff left shoe;Fasten/unfasten right shoe;Fasten/unfasten left shoe;Don/Doff right shoe Lower body dressing/undressing: 5: Supervision: Safety issues/verbal cues  FIM - Toileting Toileting steps completed by patient: Adjust clothing prior to toileting;Performs perineal hygiene;Adjust clothing after toileting Toileting Assistive Devices: Grab bar or rail for support Toileting: 6: More than reasonable amount of time  FIM - Diplomatic Services operational officerToilet Transfers Toilet Transfers Assistive Devices: Walker;Grab bars Toilet Transfers: 5-To toilet/BSC: Supervision (verbal cues/safety issues);5-From toilet/BSC: Supervision (verbal cues/safety issues)  FIM - Press photographerBed/Chair Transfer Bed/Chair Transfer Assistive Devices: Arm rests Bed/Chair Transfer: 4: Chair or W/C > Bed: Min  A (steadying Pt. > 75%)  FIM - Locomotion: Wheelchair Distance: 50 Locomotion: Wheelchair: 0: Activity did not occur FIM - Locomotion: Ambulation Locomotion: Ambulation Assistive Devices: Designer, industrial/productWalker - Rolling Ambulation/Gait Assistance: 5: Supervision Locomotion: Ambulation: 0: Activity did not occur  Comprehension Comprehension Mode: Auditory Comprehension: 5-Follows basic conversation/direction: With extra time/assistive device  Expression Expression Mode: Verbal Expression: 5-Expresses basic needs/ideas: With no assist  Social Interaction Social Interaction: 6-Interacts appropriately with others with medication or extra time (anti-anxiety, antidepressant).  Problem Solving Problem Solving: 5-Solves basic 90% of the time/requires cueing < 10% of the time  Memory Memory: 4-Recognizes or recalls 75 - 89% of the time/requires cueing 10 - 24% of the time   Medical Problem List and Plan:  1. 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 DVT Prophylaxis/Anticoagulation: Subcutaneous heparin discontinued and chronic Coumadin resumed 11/22/2013 without bridging. . Doppler showed possible Left popliteal DVT in a technically limited study, no progressive symptoms.  INR supratherapeutic 3. Pain Management: Oxycodone immediate release 5 mg every 6 hours as needed. Added kpad for right thigh 4. Mood: Clonazepam 0.5 mg 3 times a day. positive reinforcement  5. Neuropsych: This patient is capable of making decisions on her own behalf.  6. 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 Shortness of breath today appears to be anxiety related, 7. Dysphagia. Mechanical soft diet. Gastrostomy tube feeds on hold since admission                8. Diabetes mellitus.off meds, occ elevation  F/u PCP 9. Hypertension/atrial fibrillation.elevated this am but control overall improved will recheck after meds  Lasix 40 mg daily,  Norvasc increase to  10mg    Coreg 25mg  twice a day  10. Hypothyroidism. Tapazole 5 mg daily. Latest TSH level 2.230  11. Acute on Chronic anemia.  hgb 8.7 > 6.3> 8.4 post transfusion ,CT abd and pelvis without bleed Futher hx from family, pt gets 2 transfusions per year , usually in April or May, on Procrit at home, repeat Hgb improved post transfusion, f/u with local MD 12. Obstructive sleep apnea. Resumed CPAP.--pt seems to be tolerating well 13.  Pt with G tube drainage,resolved , ask pharmacy to hold warfarin to allow INR to drift down to normal.  PEG removal as outpt with surgeon who placed it.  [facility]    LOS (Days) 20 A FACE TO FACE EVALUATION WAS PERFORMED  Erick Colacendrew E Kirsteins 12/12/2013, 8:04 AM

## 2013-12-12 NOTE — Discharge Summary (Addendum)
NAMECONCHETTA, LAMIA                 ACCOUNT NO.:  [id number]  MEDICAL RECORD NO.:  [id number]  LOCATION:  [id number]                        FACILITY:  [facility]  PHYSICIAN:  Erick Colace, M.D.DATE OF BIRTH:  [birth year]  DATE OF ADMISSION:  11/22/2013 DATE OF DISCHARGE:  12/12/2013                              DISCHARGE SUMMARY   DISCHARGE DIAGNOSES: 1. Functional deficits secondary to critical illness myopathy related     to respiratory failure. 2. Chronic Coumadin therapy for history of deep venous thrombosis. 3. Pain management. 4. Anxiety. 5. Tracheostomy-decannulated. 6. Dysphagia. 7. Diabetes mellitus. 8. Hypertension with atrial fibrillation. 9. Hypothyroidism. 10.Acute on chronic anemia. 11.Obstructive sleep apnea. 12.Decreased nutritional storage.  HISTORY OF PRESENT ILLNESS:  This is a 68 year old right-handed, morbidly ill, obese African-American female.  Past medical history of hypertension, renal insufficiency, obstructive sleep apnea, DVT, atrial fibrillation with chronic Coumadin therapy.  HOSPITAL COURSE:  The patient admitted to [facility] on September 28, 2013, for removal of ileal polyp.  Noted perioperative complications, balloon catheter became dislodged.  She required exploratory laparotomy and diverting colostomy as well as appendectomy secondary to perforated sigmoid colon.  Hospital course complicated by respiratory failure, requiring intubation, eventually tracheostomy as well as gastrostomy tube for nutritional support.  She remained in intensive care for 28 days.  The patient developed sepsis, cultures growing Gram-positive cocci in clusters, maintained on broad-spectrum antibiotics.  Patient stabilized, transferred to [facility] on October 25, 2013, for further vent weaning and wound care.  An echocardiogram completed on October 28, 2013, showing mild left ventricular hypertrophy with ejection fraction 50-60%.  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  Hospital course complicated by acute renal insufficiency, creatinine 2.51, atrial fibrillation with RVR question angioedema from Invanz reaction and wound dehiscence.  The patient's tracheostomy tube downsized, later decannulated on Nov 18, 2013.  The patient did develop some respiratory stridor, placed on prednisone taper.  Maintained on a mechanical soft diet.  Patient with chronic anemia, hemoglobin varying 8.4 to 8.9. Subcutaneous heparin for DVT prophylaxis.  The patient's chronic Coumadin had been resumed on Nov 22, 2013,  The patient was admitted for comprehensive rehab program.  PAST MEDICAL HISTORY:  See discharge diagnoses.  SOCIAL HISTORY:  Lives with family, 1 level home, 4 steps to entry.  FUNCTIONAL HISTORY:  Prior to admission, independent.  Functional status upon admission to Rehab Services was moderate assist x2 for mobility, limited steps, min mod assist, activities of daily living.  PHYSICAL EXAMINATION:  VITAL SIGNS:  Blood pressure 128/70, pulse 77, respirations 18, temperature 98.6. GENERAL:  This was an alert female, in no acute distress.  Made good eye contact with examiner.  She was oriented to person, place, and time. Some delay in processing. HEENT:  Pupils round and reactive to light. LUNGS:  Decreased breath sounds.  Clear to auscultation. CARDIAC:  Regular rate and rhythm. ABDOMEN:  Soft, nontender.  Good bowel sounds.  Tracheostomy site with small air leak.  REHABILITATION HOSPITAL COURSE:  The patient was admitted to Inpatient Rehab Services with therapies initiated on a 3-hour daily  basis consisting of physical therapy, occupational therapy, speech therapy, and rehabilitation nursing.  The following issues were addressed during the patient's rehabilitation stay.  Pertaining to Mrs. Mkrtchyan functional deficits secondary to  critical illness myopathy, she continued to participate with therapies.  Chronic Coumadin had been resumed on Nov 22, 2013, without  bridging and close monitoring.  Doppler studies showed possible left popliteal DVT in a technically limited study.  No progressive symptoms.  She remained on Coumadin.  Pain management with the use of Tylenol.  She had been decannulated, oxygen saturations greater than 90% on room air.  Her diet had been advanced to mechanical soft.  She had no longer been using her gastrostomy tube, now that she was on a regular diet with good appetite.  Plans were made for removal of PEG tube, as there was noted some drainage around the site, monitored closely however due to supratherapeutic levels of INR, this was held until INR at the level to be removed to limit any chances of bleeding. Her blood sugars remained well controlled.  Concerning her acute on chronic anemia, hemoglobin varying 8.7 to 6.3, follow up per Gastroenterology Services.  CT abdomen and pelvis showed no bleeding, no hematoma noted.  It was at the discretion of Gastroenterology Services to monitor closely.  It was noted the patient had been receiving Procrit prior to admission and transfused approximately twice per year prior to this usually in April or May, and monitored.  The patient received weekly collaborative interdisciplinary team conferences to discuss estimated length of stay, family teaching, any barriers to her discharge.  She can perform navigation in the apartment with supervision using a rolling walker including bed mobility.  Patient had 1 instance of needing her brother to provide some min assist due to fatigue, however she continued to participate nicely.  Her strength and endurance continued to improve, ambulating greater than 190 feet with a rolling walker and supervision.  Activities of daily living, educated family on donning and doffing her socks.  She was able to gather her  belongings for necessary day.  The patient with colostomy and received full education in regard to this.  Full family teaching was completed and plan was to be discharged to home.  DISCHARGE MEDICATIONS:  Included Norvasc 10 mg p.o. daily, Lipitor 10 mg p.o. daily, Coreg 25 mg p.o. b.i.d., Klonopin 0.5 mg p.o. b.i.d., ferrous sulfate 325 mg p.o. t.i.d., Lasix 40 mg p.o. daily, Floranex 1 g p.o. t.i.d., Tapazole 5 mg p.o. daily, Nitro-Dur patch 0.2 mg every 24 hours, Protonix 40 mg p.o. daily, potassium chloride 40 mEq p.o. daily, prednisone taper, sodium bicarbonate 1300 mg p.o. b.i.d., Timoptic ophthalmic solution 0.5% 1 drop both eyes b.i.d., vitamin C 500 mg p.o. b.i.d., zinc sulfate 220 mg p.o. daily.  DIET:  Regular.  SPECIAL INSTRUCTIONS:  Routine care of colostomy tube, concerning Coumadin, patient's monitoring with INR between 2.0 and 3.0, latest dose of 5 mg.  A home health nurse had been ordered to check INR on December 15, 2013, results to Dr. Frances Furbish [phone], fax number [phone]. The patient should follow up with routine CBC with history of chronic anemia.  Ongoing therapies were arranged as per [employer].  Followup with general surgeon in regards to removal of PEG tube once INR less than 2.00   Mariam Dollar, P.A.   ______________________________ Erick Colace, M.D.    DA/MEDQ  D:  12/12/2013  T:  12/12/2013  Job:  [id number]  cc:   Reuel Boom  [facility] John N. Marina GoodellPerry, MD

## 2013-12-12 NOTE — Progress Notes (Signed)
Occupational Therapy Discharge Summary  Patient Details  Name: Sarah Tran MRN: [id number] Date of Birth: 1946/03/12  Today's Date: 12/12/2013    Patient has met 8 of 8 long term goals due to improved activity tolerance, improved balance, postural control, ability to compensate for deficits, functional use of  RIGHT upper, RIGHT lower, LEFT upper and LEFT lower extremity and improved coordination.  Patient to discharge at overall Supervision level.  Patient's care partner is independent to provide the necessary physical and cognitive assistance at discharge.    Reasons goals not met: n/a  Recommendation:  Patient will benefit from ongoing skilled OT services in home health setting to continue to advance functional skills in the area of BADL.  Equipment: transfer tub bench  Reasons for discharge: treatment goals met  Patient/family agrees with progress made and goals achieved: Yes  OT Discharge ADL ADL ADL Comments: Refer to FIM Vision/Perception  Vision- History Patient Visual Report: No change from baseline Vision- Assessment Vision Assessment?: No apparent visual deficits  Cognition  min A with complex memory and problem solving Sensation Sensation Light Touch: Appears Intact Stereognosis: Appears Intact Hot/Cold: Appears Intact Proprioception: Appears Intact Coordination Gross Motor Movements are Fluid and Coordinated: Yes Fine Motor Movements are Fluid and Coordinated: Yes Motor  Motor Motor - Discharge Observations: Pt has developed more strength, but continues to be weak. 4-/5 overall Mobility    refer to FIM Trunk/Postural Assessment  Cervical Assessment Cervical Assessment: Exceptions to [facility] (forward head) Thoracic Assessment Thoracic Assessment: Exceptions to [facility] (kyphotic) Lumbar Assessment Lumbar Assessment: Exceptions to [facility] (posterior pelvic tilt) Postural Control Postural Control: Within Functional Limits [facility] for basic transfers with RW)   Balance Static Sitting Balance Static Sitting - Level of Assistance: 7: Independent Dynamic Sitting Balance Dynamic Sitting - Level of Assistance: 7: Independent Static Standing Balance Static Standing - Level of Assistance: 6: Modified independent (Device/Increase time) Dynamic Standing Balance Dynamic Standing - Level of Assistance: 6: Modified independent (Device/Increase time) Extremity/Trunk Assessment RUE AROM (degrees) Right Shoulder Flexion: 80 Degrees RUE PROM (degrees) RUE Overall PROM Comments: wfl RUE Strength Right Shoulder Flexion: 3-/5 Right Elbow Flexion: 4/5 Gross Grasp: Functional LUE AROM (degrees) Left Shoulder Flexion: 85 Degrees LUE Strength Left Shoulder Flexion: 3-/5 Left Elbow Flexion: 4/5 Gross Grasp: Functional  See FIM for current functional status  Harlene Ramus 12/12/2013, 8:14 AM

## 2013-12-12 NOTE — Progress Notes (Signed)
Pt was discharged home with family. Discharged information was given by Harvel Ricks, PA

## 2013-12-14 ENCOUNTER — Telehealth: Payer: Self-pay

## 2013-12-14 NOTE — Telephone Encounter (Signed)
Who are we referring to?

## 2013-12-14 NOTE — Telephone Encounter (Signed)
May call in order for PEG removal once INR <1.5

## 2013-12-14 NOTE — Telephone Encounter (Signed)
Contacted patient's daughter to inform her that per Dr. Jodean Lima patient needs to F/U with General Surgeon. Daughter verbalized understanding.

## 2013-12-14 NOTE — Telephone Encounter (Signed)
Patient really does not need a referral her daughter knows the name of the local General surgeon who put in the PEG at [facility].  They need to contact the surgeon for the follow up, it was the same MD who did the colostomy We discussed this at length on Sat

## 2013-12-14 NOTE — Telephone Encounter (Signed)
Patients daughter called requesting an order to have patients PEG removed.  Please advise.

## 2013-12-16 ENCOUNTER — Telehealth: Payer: Self-pay

## 2013-12-16 NOTE — Telephone Encounter (Signed)
Physical therapist from [facility] called to get verbal to continue therapy.  Verbal given.

## 2013-12-19 ENCOUNTER — Telehealth: Payer: Self-pay

## 2013-12-19 NOTE — Telephone Encounter (Signed)
Edwin a physical therapist with [facility] called to get verbal to approve plan of care.  Verbal given.

## 2013-12-27 ENCOUNTER — Encounter (HOSPITAL_COMMUNITY): Payer: Self-pay | Admitting: General Practice

## 2013-12-27 ENCOUNTER — Inpatient Hospital Stay (HOSPITAL_COMMUNITY)
Admission: EM | Admit: 2013-12-27 | Discharge: 2014-02-11 | DRG: 003 | Disposition: E | Payer: Medicare Other | Source: Other Acute Inpatient Hospital | Attending: Pulmonary Disease | Admitting: Pulmonary Disease

## 2013-12-27 ENCOUNTER — Inpatient Hospital Stay (HOSPITAL_COMMUNITY): Payer: Medicare Other

## 2013-12-27 DIAGNOSIS — J9620 Acute and chronic respiratory failure, unspecified whether with hypoxia or hypercapnia: Secondary | ICD-10-CM

## 2013-12-27 DIAGNOSIS — R5381 Other malaise: Secondary | ICD-10-CM | POA: Diagnosis present

## 2013-12-27 DIAGNOSIS — Y849 Medical procedure, unspecified as the cause of abnormal reaction of the patient, or of later complication, without mention of misadventure at the time of the procedure: Secondary | ICD-10-CM | POA: Diagnosis not present

## 2013-12-27 DIAGNOSIS — R40243 Glasgow coma scale score 3-8, unspecified time: Secondary | ICD-10-CM

## 2013-12-27 DIAGNOSIS — E0500 Thyrotoxicosis with diffuse goiter without thyrotoxic crisis or storm: Secondary | ICD-10-CM | POA: Diagnosis present

## 2013-12-27 DIAGNOSIS — Y833 Surgical operation with formation of external stoma as the cause of abnormal reaction of the patient, or of later complication, without mention of misadventure at the time of the procedure: Secondary | ICD-10-CM | POA: Diagnosis not present

## 2013-12-27 DIAGNOSIS — G9340 Encephalopathy, unspecified: Secondary | ICD-10-CM | POA: Diagnosis not present

## 2013-12-27 DIAGNOSIS — Z91010 Allergy to peanuts: Secondary | ICD-10-CM

## 2013-12-27 DIAGNOSIS — I82409 Acute embolism and thrombosis of unspecified deep veins of unspecified lower extremity: Secondary | ICD-10-CM

## 2013-12-27 DIAGNOSIS — E876 Hypokalemia: Secondary | ICD-10-CM

## 2013-12-27 DIAGNOSIS — J189 Pneumonia, unspecified organism: Secondary | ICD-10-CM

## 2013-12-27 DIAGNOSIS — I469 Cardiac arrest, cause unspecified: Secondary | ICD-10-CM

## 2013-12-27 DIAGNOSIS — J930 Spontaneous tension pneumothorax: Secondary | ICD-10-CM

## 2013-12-27 DIAGNOSIS — D696 Thrombocytopenia, unspecified: Secondary | ICD-10-CM | POA: Diagnosis not present

## 2013-12-27 DIAGNOSIS — Z933 Colostomy status: Secondary | ICD-10-CM

## 2013-12-27 DIAGNOSIS — E119 Type 2 diabetes mellitus without complications: Secondary | ICD-10-CM | POA: Diagnosis present

## 2013-12-27 DIAGNOSIS — I129 Hypertensive chronic kidney disease with stage 1 through stage 4 chronic kidney disease, or unspecified chronic kidney disease: Secondary | ICD-10-CM | POA: Diagnosis present

## 2013-12-27 DIAGNOSIS — G92 Toxic encephalopathy: Secondary | ICD-10-CM | POA: Diagnosis present

## 2013-12-27 DIAGNOSIS — N289 Disorder of kidney and ureter, unspecified: Secondary | ICD-10-CM

## 2013-12-27 DIAGNOSIS — R4020 Unspecified coma: Secondary | ICD-10-CM

## 2013-12-27 DIAGNOSIS — R579 Shock, unspecified: Secondary | ICD-10-CM | POA: Diagnosis not present

## 2013-12-27 DIAGNOSIS — J398 Other specified diseases of upper respiratory tract: Principal | ICD-10-CM

## 2013-12-27 DIAGNOSIS — I4891 Unspecified atrial fibrillation: Secondary | ICD-10-CM | POA: Diagnosis present

## 2013-12-27 DIAGNOSIS — J9621 Acute and chronic respiratory failure with hypoxia: Secondary | ICD-10-CM

## 2013-12-27 DIAGNOSIS — J386 Stenosis of larynx: Secondary | ICD-10-CM | POA: Diagnosis present

## 2013-12-27 DIAGNOSIS — T797XXA Traumatic subcutaneous emphysema, initial encounter: Secondary | ICD-10-CM

## 2013-12-27 DIAGNOSIS — J982 Interstitial emphysema: Secondary | ICD-10-CM

## 2013-12-27 DIAGNOSIS — Z23 Encounter for immunization: Secondary | ICD-10-CM

## 2013-12-27 DIAGNOSIS — Z9911 Dependence on respirator [ventilator] status: Secondary | ICD-10-CM

## 2013-12-27 DIAGNOSIS — Z7901 Long term (current) use of anticoagulants: Secondary | ICD-10-CM

## 2013-12-27 DIAGNOSIS — E872 Acidosis, unspecified: Secondary | ICD-10-CM | POA: Diagnosis present

## 2013-12-27 DIAGNOSIS — R011 Cardiac murmur, unspecified: Secondary | ICD-10-CM | POA: Diagnosis present

## 2013-12-27 DIAGNOSIS — D638 Anemia in other chronic diseases classified elsewhere: Secondary | ICD-10-CM

## 2013-12-27 DIAGNOSIS — K9420 Gastrostomy complication, unspecified: Secondary | ICD-10-CM

## 2013-12-27 DIAGNOSIS — J9601 Acute respiratory failure with hypoxia: Secondary | ICD-10-CM

## 2013-12-27 DIAGNOSIS — Z6841 Body Mass Index (BMI) 40.0 and over, adult: Secondary | ICD-10-CM

## 2013-12-27 DIAGNOSIS — I498 Other specified cardiac arrhythmias: Secondary | ICD-10-CM | POA: Diagnosis present

## 2013-12-27 DIAGNOSIS — K219 Gastro-esophageal reflux disease without esophagitis: Secondary | ICD-10-CM | POA: Diagnosis present

## 2013-12-27 DIAGNOSIS — Z66 Do not resuscitate: Secondary | ICD-10-CM | POA: Diagnosis not present

## 2013-12-27 DIAGNOSIS — Z885 Allergy status to narcotic agent status: Secondary | ICD-10-CM

## 2013-12-27 DIAGNOSIS — N189 Chronic kidney disease, unspecified: Secondary | ICD-10-CM | POA: Diagnosis present

## 2013-12-27 DIAGNOSIS — M129 Arthropathy, unspecified: Secondary | ICD-10-CM | POA: Diagnosis present

## 2013-12-27 DIAGNOSIS — G4733 Obstructive sleep apnea (adult) (pediatric): Secondary | ICD-10-CM | POA: Diagnosis present

## 2013-12-27 DIAGNOSIS — N183 Chronic kidney disease, stage 3 unspecified: Secondary | ICD-10-CM | POA: Diagnosis present

## 2013-12-27 DIAGNOSIS — Z515 Encounter for palliative care: Secondary | ICD-10-CM

## 2013-12-27 DIAGNOSIS — I420 Dilated cardiomyopathy: Secondary | ICD-10-CM

## 2013-12-27 DIAGNOSIS — M109 Gout, unspecified: Secondary | ICD-10-CM | POA: Diagnosis present

## 2013-12-27 DIAGNOSIS — I428 Other cardiomyopathies: Secondary | ICD-10-CM | POA: Diagnosis present

## 2013-12-27 DIAGNOSIS — Z930 Tracheostomy status: Secondary | ICD-10-CM

## 2013-12-27 DIAGNOSIS — K449 Diaphragmatic hernia without obstruction or gangrene: Secondary | ICD-10-CM | POA: Diagnosis present

## 2013-12-27 DIAGNOSIS — G931 Anoxic brain damage, not elsewhere classified: Secondary | ICD-10-CM

## 2013-12-27 DIAGNOSIS — G936 Cerebral edema: Secondary | ICD-10-CM | POA: Diagnosis not present

## 2013-12-27 DIAGNOSIS — N179 Acute kidney failure, unspecified: Secondary | ICD-10-CM | POA: Diagnosis present

## 2013-12-27 DIAGNOSIS — J988 Other specified respiratory disorders: Principal | ICD-10-CM

## 2013-12-27 DIAGNOSIS — Z86718 Personal history of other venous thrombosis and embolism: Secondary | ICD-10-CM

## 2013-12-27 DIAGNOSIS — E785 Hyperlipidemia, unspecified: Secondary | ICD-10-CM | POA: Diagnosis present

## 2013-12-27 DIAGNOSIS — J9509 Other tracheostomy complication: Secondary | ICD-10-CM | POA: Diagnosis not present

## 2013-12-27 DIAGNOSIS — G928 Other toxic encephalopathy: Secondary | ICD-10-CM

## 2013-12-27 DIAGNOSIS — Z880 Allergy status to penicillin: Secondary | ICD-10-CM

## 2013-12-27 DIAGNOSIS — J9622 Acute and chronic respiratory failure with hypercapnia: Secondary | ICD-10-CM

## 2013-12-27 DIAGNOSIS — F411 Generalized anxiety disorder: Secondary | ICD-10-CM | POA: Diagnosis present

## 2013-12-27 DIAGNOSIS — T8182XA Emphysema (subcutaneous) resulting from a procedure, initial encounter: Secondary | ICD-10-CM | POA: Diagnosis not present

## 2013-12-27 DIAGNOSIS — I4819 Other persistent atrial fibrillation: Secondary | ICD-10-CM

## 2013-12-27 DIAGNOSIS — E873 Alkalosis: Secondary | ICD-10-CM | POA: Diagnosis not present

## 2013-12-27 DIAGNOSIS — R1310 Dysphagia, unspecified: Secondary | ICD-10-CM | POA: Diagnosis present

## 2013-12-27 DIAGNOSIS — H409 Unspecified glaucoma: Secondary | ICD-10-CM | POA: Diagnosis present

## 2013-12-27 HISTORY — DX: Personal history of other diseases of the digestive system: Z87.19

## 2013-12-27 HISTORY — DX: Unspecified osteoarthritis, unspecified site: M19.90

## 2013-12-27 HISTORY — DX: Personal history of other medical treatment: Z92.89

## 2013-12-27 HISTORY — DX: Unspecified glaucoma: H40.9

## 2013-12-27 HISTORY — DX: Gout, unspecified: M10.9

## 2013-12-27 HISTORY — DX: Anxiety disorder, unspecified: F41.9

## 2013-12-27 HISTORY — DX: Cardiac murmur, unspecified: R01.1

## 2013-12-27 LAB — CBC
HCT: 22.2 % — ABNORMAL LOW (ref 36.0–46.0)
Hemoglobin: 7.1 g/dL — ABNORMAL LOW (ref 12.0–15.0)
MCH: 29.3 pg (ref 26.0–34.0)
MCHC: 32 g/dL (ref 30.0–36.0)
MCV: 91.7 fL (ref 78.0–100.0)
Platelets: 167 10*3/uL (ref 150–400)
RBC: 2.42 MIL/uL — ABNORMAL LOW (ref 3.87–5.11)
RDW: 15.6 % — ABNORMAL HIGH (ref 11.5–15.5)
WBC: 4.5 10*3/uL (ref 4.0–10.5)

## 2013-12-27 LAB — URINE MICROSCOPIC-ADD ON

## 2013-12-27 LAB — URINALYSIS, ROUTINE W REFLEX MICROSCOPIC
Bilirubin Urine: NEGATIVE
Glucose, UA: NEGATIVE mg/dL
Hgb urine dipstick: NEGATIVE
Ketones, ur: NEGATIVE mg/dL
Leukocytes, UA: NEGATIVE
NITRITE: NEGATIVE
Protein, ur: 100 mg/dL — AB
Urobilinogen, UA: 0.2 mg/dL (ref 0.0–1.0)
pH: 5.5 (ref 5.0–8.0)

## 2013-12-27 LAB — LACTIC ACID, PLASMA: Lactic Acid, Venous: 0.9 mmol/L (ref 0.5–2.2)

## 2013-12-27 LAB — POCT I-STAT 3, ART BLOOD GAS (G3+)
Acid-Base Excess: 1 mmol/L (ref 0.0–2.0)
Bicarbonate: 25.9 mEq/L — ABNORMAL HIGH (ref 20.0–24.0)
O2 SAT: 100 %
PCO2 ART: 40.8 mmHg (ref 35.0–45.0)
PH ART: 7.41 (ref 7.350–7.450)
Patient temperature: 98.6
TCO2: 27 mmol/L (ref 0–100)
pO2, Arterial: 167 mmHg — ABNORMAL HIGH (ref 80.0–100.0)

## 2013-12-27 LAB — BASIC METABOLIC PANEL WITH GFR
BUN: 33 mg/dL — ABNORMAL HIGH (ref 6–23)
CO2: 23 meq/L (ref 19–32)
Calcium: 9.2 mg/dL (ref 8.4–10.5)
Chloride: 101 meq/L (ref 96–112)
Creatinine, Ser: 2.22 mg/dL — ABNORMAL HIGH (ref 0.50–1.10)
GFR calc Af Amer: 25 mL/min — ABNORMAL LOW
GFR calc non Af Amer: 22 mL/min — ABNORMAL LOW
Glucose, Bld: 97 mg/dL (ref 70–99)
Potassium: 4.1 meq/L (ref 3.7–5.3)
Sodium: 141 meq/L (ref 137–147)

## 2013-12-27 LAB — PRO B NATRIURETIC PEPTIDE: Pro B Natriuretic peptide (BNP): 838.6 pg/mL — ABNORMAL HIGH (ref 0–125)

## 2013-12-27 LAB — PROTIME-INR
INR: 1.11 (ref 0.00–1.49)
PROTHROMBIN TIME: 14.1 s (ref 11.6–15.2)

## 2013-12-27 LAB — GLUCOSE, CAPILLARY
Glucose-Capillary: 106 mg/dL — ABNORMAL HIGH (ref 70–99)
Glucose-Capillary: 100 mg/dL — ABNORMAL HIGH (ref 70–99)

## 2013-12-27 LAB — MAGNESIUM: MAGNESIUM: 1.6 mg/dL (ref 1.5–2.5)

## 2013-12-27 LAB — PHOSPHORUS: Phosphorus: 3.9 mg/dL (ref 2.3–4.6)

## 2013-12-27 LAB — MRSA PCR SCREENING: MRSA BY PCR: NEGATIVE

## 2013-12-27 LAB — PROCALCITONIN: Procalcitonin: 0.12 ng/mL

## 2013-12-27 MED ORDER — AMLODIPINE BESYLATE 10 MG PO TABS
10.0000 mg | ORAL_TABLET | Freq: Every day | ORAL | Status: DC
  Administered 2013-12-28 – 2013-12-29 (×2): 10 mg via ORAL
  Filled 2013-12-27 (×5): qty 1

## 2013-12-27 MED ORDER — HEPARIN SODIUM (PORCINE) 5000 UNIT/ML IJ SOLN
5000.0000 [IU] | Freq: Three times a day (TID) | INTRAMUSCULAR | Status: DC
  Administered 2013-12-27: 5000 [IU] via SUBCUTANEOUS
  Filled 2013-12-27 (×2): qty 1

## 2013-12-27 MED ORDER — LEVOFLOXACIN IN D5W 750 MG/150ML IV SOLN
750.0000 mg | INTRAVENOUS | Status: AC
  Administered 2013-12-27: 750 mg via INTRAVENOUS
  Filled 2013-12-27: qty 150

## 2013-12-27 MED ORDER — CHOLECALCIFEROL 10 MCG (400 UNIT) PO TABS
400.0000 [IU] | ORAL_TABLET | Freq: Every day | ORAL | Status: DC
  Administered 2013-12-28 – 2014-01-10 (×9): 400 [IU] via ORAL
  Filled 2013-12-27 (×14): qty 1

## 2013-12-27 MED ORDER — PANTOPRAZOLE SODIUM 40 MG PO TBEC: 40.0000 mg | DELAYED_RELEASE_TABLET | Freq: Every day | ORAL | Status: DC

## 2013-12-27 MED ORDER — DEXAMETHASONE SODIUM PHOSPHATE 4 MG/ML IJ SOLN
4.0000 mg | Freq: Four times a day (QID) | INTRAMUSCULAR | Status: DC
  Administered 2013-12-27 – 2013-12-28 (×2): 4 mg via INTRAVENOUS
  Filled 2013-12-27 (×6): qty 1

## 2013-12-27 MED ORDER — WARFARIN - PHARMACIST DOSING INPATIENT: Freq: Every day | Status: DC

## 2013-12-27 MED ORDER — FERROUS SULFATE 325 (65 FE) MG PO TABS
325.0000 mg | ORAL_TABLET | Freq: Three times a day (TID) | ORAL | Status: DC
  Administered 2013-12-28 – 2014-01-10 (×19): 325 mg via ORAL
  Filled 2013-12-27 (×43): qty 1

## 2013-12-27 MED ORDER — BRIMONIDINE TARTRATE-TIMOLOL 0.2-0.5 % OP SOLN
1.0000 [drp] | Freq: Two times a day (BID) | OPHTHALMIC | Status: DC
  Filled 2013-12-27: qty 10

## 2013-12-27 MED ORDER — BIOTENE DRY MOUTH MT LIQD
15.0000 mL | Freq: Two times a day (BID) | OROMUCOSAL | Status: DC
  Administered 2013-12-28 – 2014-01-06 (×17): 15 mL via OROMUCOSAL

## 2013-12-27 MED ORDER — CHLORHEXIDINE GLUCONATE 0.12 % MT SOLN
15.0000 mL | Freq: Two times a day (BID) | OROMUCOSAL | Status: DC
  Administered 2013-12-27 – 2014-01-10 (×26): 15 mL via OROMUCOSAL
  Filled 2013-12-27 (×29): qty 15

## 2013-12-27 MED ORDER — IPRATROPIUM-ALBUTEROL 0.5-2.5 (3) MG/3ML IN SOLN
3.0000 mL | Freq: Four times a day (QID) | RESPIRATORY_TRACT | Status: DC
  Administered 2013-12-27 – 2013-12-28 (×2): 3 mL via RESPIRATORY_TRACT
  Filled 2013-12-27: qty 3

## 2013-12-27 MED ORDER — POTASSIUM CHLORIDE CRYS ER 20 MEQ PO TBCR
40.0000 meq | EXTENDED_RELEASE_TABLET | Freq: Once | ORAL | Status: AC
  Administered 2013-12-27: 40 meq via ORAL
  Filled 2013-12-27: qty 2

## 2013-12-27 MED ORDER — LEVOFLOXACIN IN D5W 500 MG/100ML IV SOLN: 500.0000 mg | INTRAVENOUS | Status: DC

## 2013-12-27 MED ORDER — PNEUMOCOCCAL VAC POLYVALENT 25 MCG/0.5ML IJ INJ
0.5000 mL | INJECTION | INTRAMUSCULAR | Status: DC
  Filled 2013-12-27: qty 0.5

## 2013-12-27 MED ORDER — VANCOMYCIN HCL 10 G IV SOLR
1500.0000 mg | INTRAVENOUS | Status: DC
  Administered 2013-12-27: 1500 mg via INTRAVENOUS
  Filled 2013-12-27: qty 1500

## 2013-12-27 MED ORDER — CARVEDILOL 25 MG PO TABS
25.0000 mg | ORAL_TABLET | Freq: Two times a day (BID) | ORAL | Status: DC
  Administered 2013-12-28 – 2013-12-29 (×3): 25 mg via ORAL
  Filled 2013-12-27 (×5): qty 1

## 2013-12-27 MED ORDER — ATORVASTATIN CALCIUM 20 MG PO TABS
20.0000 mg | ORAL_TABLET | Freq: Every day | ORAL | Status: DC
  Administered 2013-12-27 – 2013-12-29 (×3): 20 mg via ORAL
  Filled 2013-12-27 (×6): qty 1

## 2013-12-27 MED ORDER — ASPIRIN 300 MG RE SUPP: 300.0000 mg | RECTAL | Status: AC

## 2013-12-27 MED ORDER — PANTOPRAZOLE SODIUM 40 MG IV SOLR
40.0000 mg | INTRAVENOUS | Status: DC
  Filled 2013-12-27: qty 40

## 2013-12-27 MED ORDER — SODIUM CHLORIDE 0.9 % IV SOLN
250.0000 mL | INTRAVENOUS | Status: DC | PRN
Start: 2013-12-27 — End: 2014-01-03

## 2013-12-27 MED ORDER — SODIUM CHLORIDE 0.9 % IV SOLN
INTRAVENOUS | Status: DC
  Administered 2013-12-27: 21:00:00 via INTRAVENOUS

## 2013-12-27 MED ORDER — FUROSEMIDE 10 MG/ML IJ SOLN
80.0000 mg | Freq: Two times a day (BID) | INTRAMUSCULAR | Status: DC
  Administered 2013-12-27: 80 mg via INTRAVENOUS
  Filled 2013-12-27: qty 8

## 2013-12-27 MED ORDER — DARBEPOETIN ALFA-POLYSORBATE 40 MCG/0.4ML IJ SOLN
40.0000 ug | INTRAMUSCULAR | Status: DC
  Administered 2013-12-31 – 2014-01-07 (×2): 40 ug via SUBCUTANEOUS
  Filled 2013-12-27 (×2): qty 0.4

## 2013-12-27 MED ORDER — ALBUTEROL SULFATE (2.5 MG/3ML) 0.083% IN NEBU: 2.5000 mg | INHALATION_SOLUTION | RESPIRATORY_TRACT | Status: DC | PRN

## 2013-12-27 MED ORDER — ASPIRIN 81 MG PO CHEW
324.0000 mg | CHEWABLE_TABLET | ORAL | Status: AC
Start: 2013-12-27 — End: 2013-12-27
  Administered 2013-12-27: 324 mg via ORAL
  Filled 2013-12-27: qty 4

## 2013-12-27 NOTE — H&P (Signed)
PULMONARY / CRITICAL CARE MEDICINE   Name: Sarah Tran MRN: [id number] DOB: 05-Jun-1946    ADMISSION DATE:  2014/01/13 CONSULTATION DATE:  01-13-14  REFERRING MD :  EDP PRIMARY SERVICE: PCCM  CHIEF COMPLAINT:  Respiratory Distress  BRIEF PATIENT DESCRIPTION: 68 y.o. F with recent prolonged hospitalization at [facility] including trach.  Went to [facility] 01-14-2023 for respiratory distress, CXR suggestive of PNA.  Transferred to [facility] via helicopter for concerns that she may need emergent bedside trach/airway.  PCCM was consulted as primary  SIGNIFICANT EVENTS / STUDIES:    LINES / TUBES: Foley 01/14/23 >>>  CULTURES: Blood 01/14/23 >>> Urine 14-Jan-2023 >>> Respiratory 01/14/2023  >>>  ANTIBIOTICS: Levaquin 2023-01-14 >>> Vancomycin January 14, 2023 >>>  HISTORY OF PRESENT ILLNESS:  Sarah Tran is a 68 y.o. F with multiple co-morbidities as outlined below.  She recently was hospitalized at [facility] after she had a perforated sigmoid colon following colonoscopy at [facility]  During hospitalization, she required ex lap and diverting colostomy as well as appendectomy.  Hospital course was complicated by respiratory failure requiring intubation and eventual trach.  She was transferred to [facility] followed by CIR for rehab to address deconditioning.  Total hospital stay was roughly 3 months. After CIR, she was discharged to her home in [place].   Per pt and her family, she had been doing well and was making some progress up until about 2 days prior to day of admit when she began to experience some SOB and decreased energy/activity level.  During this time, she also began to have a cough that was non-productive. Per family, pt had gone to a nephrology follow up appointment earlier in the day, and following appointment, began to feel very hot and more SOB.  She was subsequently taken to the hospital where she was initially communicating/alert, but then became unresponsive.  She had an episode of bradycardia down to 40's and SpO2 50%.   BVM ventilation was initiated which improved SpO2; however, as soon as it was stopped, SpO2 dropped back down into 50's.  She was subsequently placed on BiPAP with significant improvement.  CXR obtained in the ED suggestive of RLL PNA. Due to her hx of difficult airway including trach and the fact that there are no pulmonologist's or ENT physicians at [facility], decision was made to transfer to [facility].  Due to long drive time (2 - 2.5 hrs), she was transported via helicopter.   PAST MEDICAL HISTORY :  Past Medical History  Diagnosis Date  . Atrial fibrillation     chronic Coumadin  . Anemia of chronic disease   . HTN (hypertension)   . Morbid obesity   . Diabetes mellitus, type II   . Hyperthyroidism   . Hyperlipidemia   . CKD (chronic kidney disease), stage III   . DVT (deep venous thrombosis)     RLE; LUE; chronic Coumadin  . Glaucoma, bilateral   . Heart murmur   . OSA on CPAP   . History of blood transfusion ~ 2004; 11/2013    "while in hospital"  . H/O hiatal hernia   . Arthritis     "joints" (2014-01-13)  . Gout   . Anxiety    Past Surgical History  Procedure Laterality Date  . Exploratory laparotomy  09/2013    appy, diverting colostomy for sigmoid perf following removal of ileal polyp.   . Gastrostomy tube placement  10/2013  . Tracheostomy  10/2013    placed in [facility]. Trach decannulated  11/18/2013 [facility].  . Cholecystectomy    . Carpal tunnel release Right   . Appendectomy    . Vaginal hysterectomy      "partial"  . Cardiac catheterization    . Tracheostomy closure     Prior to Admission medications   Medication Sig Start Date End Date Taking? Authorizing Provider  albuterol (PROVENTIL HFA;VENTOLIN HFA) 108 (90 BASE) MCG/ACT inhaler Inhale 2 puffs into the lungs every 4 (four) hours as needed for wheezing or shortness of breath. 12/12/13  Yes Daniel J Angiulli, PA-C  amLODipine (NORVASC) 10 MG tablet Take 1 tablet (10 mg total) by mouth daily.  12/12/13  Yes Daniel J Angiulli, PA-C  atorvastatin (LIPITOR) 20 MG tablet Take 1 tablet (20 mg total) by mouth daily. 12/12/13  Yes Daniel J Angiulli, PA-C  brimonidine-timolol (COMBIGAN) 0.2-0.5 % ophthalmic solution Place 1 drop into both eyes every 12 (twelve) hours.    Historical Provider, MD  carvedilol (COREG) 25 MG tablet Take 1 tablet (25 mg total) by mouth 2 (two) times daily with a meal. 12/12/13   Mcarthur Rossettianiel J Angiulli, PA-C  cholecalciferol (VITAMIN D) 400 UNITS TABS tablet Take 400 Units by mouth daily.    Historical Provider, MD  clonazePAM (KLONOPIN) 0.5 MG tablet Take 1 tablet (0.5 mg total) by mouth 2 (two) times daily. 12/12/13   Mcarthur Rossettianiel J Angiulli, PA-C  darbepoetin (ARANESP) 40 MCG/0.4ML SOLN injection Inject 0.4 mLs (40 mcg total) into the skin every Saturday at 6 PM. 12/12/13   Mcarthur Rossettianiel J Angiulli, PA-C  ferrous sulfate 325 (65 FE) MG tablet Take 1 tablet (325 mg total) by mouth 3 (three) times daily with meals. 12/12/13   Mcarthur Rossettianiel J Angiulli, PA-C  furosemide (LASIX) 40 MG tablet Take 1 tablet (40 mg total) by mouth daily. 12/12/13   Mcarthur Rossettianiel J Angiulli, PA-C  IRON PO Take 1 tablet by mouth daily.    Historical Provider, MD  lactobacillus (FLORANEX/LACTINEX) PACK Take 1 packet (1 g total) by mouth 3 (three) times daily with meals. 12/12/13   Mcarthur Rossettianiel J Angiulli, PA-C  methimazole (TAPAZOLE) 5 MG tablet Take 1 tablet (5 mg total) by mouth daily. 12/12/13   Mcarthur Rossettianiel J Angiulli, PA-C  nitroGLYCERIN (NITRODUR - DOSED IN MG/24 HR) 0.2 mg/hr patch Place 1 patch (0.2 mg total) onto the skin daily. 12/12/13   Mcarthur Rossettianiel J Angiulli, PA-C  pantoprazole (PROTONIX) 40 MG tablet Take 1 tablet (40 mg total) by mouth daily at 6 (six) AM. 12/12/13   Mcarthur Rossettianiel J Angiulli, PA-C  potassium chloride SA (K-DUR,KLOR-CON) 20 MEQ tablet Take 2 tablets (40 mEq total) by mouth daily. 12/12/13   Mcarthur Rossettianiel J Angiulli, PA-C  sodium bicarbonate 650 MG tablet Take 2 tablets (1,300 mg total) by mouth 2 (two) times daily. 12/12/13   Mcarthur Rossettianiel J Angiulli, PA-C   vitamin C (VITAMIN C) 500 MG tablet Take 1 tablet (500 mg total) by mouth 2 (two) times daily. 12/12/13   Mcarthur Rossettianiel J Angiulli, PA-C  warfarin (COUMADIN) 2 MG tablet Take 1 tablet (2 mg total) by mouth daily at 6 PM. 12/12/13   Mcarthur Rossettianiel J Angiulli, PA-C  zinc sulfate 220 MG capsule Take 1 capsule (220 mg total) by mouth daily. 12/12/13   Mcarthur Rossettianiel J Angiulli, PA-C   Allergies  Allergen Reactions  . Codeine Other (See Comments)    Pass out    . Peanut Butter Flavor Hives and Other (See Comments)    Lockjaw   . Penicillins Swelling    FAMILY HISTORY:  History reviewed.  No pertinent family history. SOCIAL HISTORY:  reports that she has never smoked. She has never used smokeless tobacco. She reports that she does not drink alcohol or use illicit drugs.  REVIEW OF SYSTEMS:   All negative; except for those that are bolded, which indicate positives.  Constitutional: weight loss, weight gain, night sweats, fevers, chills, fatigue, weakness.  HEENT: headaches, sore throat, sneezing, nasal congestion, post nasal drip, difficulty swallowing, tooth/dental problems, visual complaints, visual changes, ear aches. Neuro: difficulty with speech, weakness, numbness, ataxia. CV:  chest pain, orthopnea, PND, swelling in lower extremities, dizziness, palpitations, syncope.  Resp: cough, hemoptysis, dyspnea, wheezing. GI  heartburn, indigestion, abdominal pain, nausea, vomiting, diarrhea, constipation, change in bowel habits, loss of appetite, hematemesis, melena, hematochezia.  GU: dysuria, change in color of urine, urgency or frequency, flank pain, hematuria. MSK: joint pain or swelling, decreased range of motion. Psych: change in mood or affect, depression, anxiety, suicidal ideations, homicidal ideations. Skin: rash, itching, bruising.    SUBJECTIVE:  Feels better than she did when she got to ED at [facility].  Still has some wheezing, no chest pain.  SOB improved.  VITAL SIGNS: Temp:  [97.6 F (36.4 C)-98.1  F (36.7 C)] 98.1 F (36.7 C) (06/16 2004) Pulse Rate:  [77-81] 80 (06/16 1900) Resp:  [13-21] 13 (06/16 1900) BP: (138-140)/(70-82) 140/70 mmHg (06/16 1900) SpO2:  [100 %] 100 % (06/16 1900) FiO2 (%):  [50 %] 50 % (06/16 1820) Weight:  [113.9 kg (251 lb 1.7 oz)] 113.9 kg (251 lb 1.7 oz) (06/16 1820) HEMODYNAMICS:   VENTILATOR SETTINGS: Vent Mode:  [-]  FiO2 (%):  [50 %] 50 % INTAKE / OUTPUT: Intake/Output   None     PHYSICAL EXAMINATION: General: morbidly obese female, resting in bed, in NAD. Neuro: A&O x 3, non-focal.  HEENT: [place]/AT. PERRL, sclerae anicteric, neck with coarse stridor inhlation Cardiovascular: IRIR, no M/R/G appreciated. Lungs: Respirations even and unlabored.  Scattered wheezes. Abdomen: Obese, BS x 4, soft, NT/ND. Colostomy bag in place. Musculoskeletal: No gross deformities, 2+ edema. Skin: Intact, warm, no rashes.    LABS:  CBC No results found for this basename: WBC, HGB, HCT, PLT,  in the last 168 hours Coag's No results found for this basename: APTT, INR,  in the last 168 hours BMET No results found for this basename: NA, K, CL, CO2, BUN, CREATININE, GLUCOSE,  in the last 168 hours Electrolytes No results found for this basename: CALCIUM, MG, PHOS,  in the last 168 hours Sepsis Markers No results found for this basename: LATICACIDVEN, PROCALCITON, O2SATVEN,  in the last 168 hours ABG  Recent Labs Lab 01/07/2014 1834  PHART 7.410  PCO2ART 40.8  PO2ART 167.0*   Liver Enzymes No results found for this basename: AST, ALT, ALKPHOS, BILITOT, ALBUMIN,  in the last 168 hours Cardiac Enzymes No results found for this basename: TROPONINI, PROBNP,  in the last 168 hours Glucose  Recent Labs Lab 01/10/2014 1820 01/08/2014 2000  GLUCAP 106* 100*    Imaging No results found.   ASSESSMENT / PLAN:  PULMONARY A: Acute Respiratory Failure HCAP unsure, may have stenosis with poor secretion control Respiratory Acidosis Hx recent trach ?  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 - Decadron 4mg  q6 for concern airway edema / stenosis - CXR now. -  ABG and CXR in AM. -lasix -may need ENT assessment and/ or bronch by PCCM  CARDIOVASCULAR A:  A.Fib rate controlled P:  - Continue outpatient coumadin, amlodipine, atorvastatin, carvedilol. - Increase lasix to 80 q12. - Check lactate.  RENAL A:   Acute on chronic kidney disease P:   - NS @ KVO. - Potassium repletion PO. - Check BMP, Mag, Phos now. - BMP in AM. -lasix escallation  GASTROINTESTINAL A:   Nutrition GERD Colostomy status P:   - NPO for now, consider diet in AM. - Continue outpatient Pantoprazole.  HEMATOLOGIC A:  Anemia of chronic disease P:  - VTE Proph:  Heparin / SCD's. - CBC in AM.  INFECTIOUS A:   HCAP r/o  P:   - Cultures and antibiotics as above, narrow abx as cultures result. 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 - SSI.  NEUROLOGIC A:   Monitor for pain P:   Pain assessment Cam scoring  Rutherford Guys, PA - [facility] Pgr: [phone]  or [phone]   I have personally obtained a history, examined the patient, evaluated laboratory and imaging results, formulated the assessment and plan and placed orders. CRITICAL CARE: The patient is critically ill with multiple organ systems failure and requires high complexity decision making for assessment and support, frequent evaluation and titration of therapies, application of advanced monitoring technologies and extensive interpretation of multiple databases. Critical Care Time devoted to patient care services described in this note is 35 minutes.    daughter at bedside, updated  Mcarthur Rossetti. Tyson Alias, MD, FACP Pgr: [phone] [facility]  12/20/2013, 8:39 PM

## 2013-12-27 NOTE — Progress Notes (Signed)
ANTICOAGULATION CONSULT NOTE - Initial Consult  Pharmacy Consult for Heparin/Coumadin Indication: h/o DVT/PE/Afib  Allergies  Allergen Reactions  . Codeine Other (See Comments)    Pass out    . Peanut Butter Flavor Hives and Other (See Comments)    Lockjaw   . Penicillins Swelling    Patient Measurements: Height: 5\' 2"  (157.5 cm) Weight: 251 lb 1.7 oz (113.9 kg) IBW/kg (Calculated) : 50.1 Heparin Dosing Weight: 80 kg  Vital Signs: Temp: 98.3 F (36.8 C) (06/16 2342) Temp src: Oral (06/16 2342) BP: 167/77 mmHg (06/16 2350) Pulse Rate: 94 (06/16 2350)  Labs:  Recent Labs  12/30/2013 2246  HGB 7.1*  HCT 22.2*  PLT 167  LABPROT 14.1  INR 1.11  CREATININE 2.22*    Estimated Creatinine Clearance: 29.8 ml/min (by C-G formula based on Cr of 2.22).   Medical History: Past Medical History  Diagnosis Date  . Atrial fibrillation     chronic Coumadin  . Anemia of chronic disease   . HTN (hypertension)   . Morbid obesity   . Diabetes mellitus, type II   . Hyperthyroidism   . Hyperlipidemia   . CKD (chronic kidney disease), stage III   . DVT (deep venous thrombosis)     RLE; LUE; chronic Coumadin  . Glaucoma, bilateral   . Heart murmur   . OSA on CPAP   . History of blood transfusion ~ 2004; 11/2013    "while in hospital"  . H/O hiatal hernia   . Arthritis     "joints" (12/24/2013)  . Gout   . Anxiety     Medications:  Norvasc  Vit C  ASA  Lipitor  Combigan  Coreg  Klonopin  Procrit  Iron  Lasix  Methimazole  Ntg patch  Protonix  KCl  Bicarb  Zinc   Coumadin 2 mg daily  Assessment: 68 yo female with h/o DVT/PE and Afib, subtherapeutic INR, for heparin and Coumadin  Goal of Therapy:  Heparin level 0.3-0.7 units/ml Monitor platelets by anticoagulation protocol: Yes   Plan:  Heparin 2000 units IV bolus, then 1200 units/hr Check heparin level in 8 hours. Coumadin 7.5 mg tonight Daily INR  Eddie Candle 12/16/2013,11:59 PM

## 2013-12-27 NOTE — Progress Notes (Signed)
ANTIBIOTIC CONSULT NOTE - INITIAL  Pharmacy Consult for Vancomycin and levaquin Indication: HCAP  ANTICOAGULATION CONSULT NOTE - Initial Consult Pharmacy Consult for Coumadin  Indication: atrial fibrillation   Allergies  Allergen Reactions  . Codeine Other (See Comments)    Pass out    . Peanut Butter Flavor Hives and Other (See Comments)    Lockjaw   . Penicillins Swelling    Patient Measurements: Height: 5\' 2"  (157.5 cm) Weight: 251 lb 1.7 oz (113.9 kg) IBW/kg (Calculated) : 50.1  Vital Signs: Temp: 98.1 F (36.7 C) (06/16 2004) Temp src: Oral (06/16 2004) BP: 127/64 mmHg (06/16 2200) Pulse Rate: 78 (06/16 2200) Intake/Output from previous day:   Intake/Output from this shift: Total I/O In: 40 [I.V.:40] Out: -    Labs:  Recent Labs  01/02/2014 2246  HGB 7.1*  HCT 22.2*  PLT 167     Recent Labs  01/06/2014 2246  WBC 4.5  HGB 7.1*  PLT 167   Estimated Creatinine Clearance: 28.8 ml/min (by C-G formula based on Cr of 2.29). No results found for this basename: VANCOTROUGH, Leodis Binet, VANCORANDOM, GENTTROUGH, GENTPEAK, GENTRANDOM, TOBRATROUGH, TOBRAPEAK, TOBRARND, AMIKACINPEAK, AMIKACINTROU, AMIKACIN,  in the last 72 hours   Microbiology: Recent Results (from the past 720 hour(s))  MRSA PCR SCREENING     Status: None   Collection Time    12/17/2013  6:17 PM      Result Value Ref Range Status   MRSA by PCR NEGATIVE  NEGATIVE Final   Comment:            The GeneXpert MRSA Assay (FDA     approved for NASAL specimens     only), is one component of a     comprehensive MRSA colonization     surveillance program. It is not     intended to diagnose MRSA     infection nor to guide or     monitor treatment for     MRSA infections.    Medical History: Past Medical History  Diagnosis Date  . Atrial fibrillation     chronic Coumadin  . Anemia of chronic disease   . HTN (hypertension)   . Morbid obesity   . Diabetes mellitus, type II   .  Hyperthyroidism   . Hyperlipidemia   . CKD (chronic kidney disease), stage III   . DVT (deep venous thrombosis)     RLE; LUE; chronic Coumadin  . Glaucoma, bilateral   . Heart murmur   . OSA on CPAP   . History of blood transfusion ~ 2004; 11/2013    "while in hospital"  . H/O hiatal hernia   . Arthritis     "joints" (01/09/2014)  . Gout   . Anxiety     Medications:  Prescriptions prior to admission  Medication Sig Dispense Refill  . albuterol (PROVENTIL HFA;VENTOLIN HFA) 108 (90 BASE) MCG/ACT inhaler Inhale 2 puffs into the lungs every 4 (four) hours as needed for wheezing or shortness of breath.  1 Inhaler  1  . amLODipine (NORVASC) 10 MG tablet Take 1 tablet (10 mg total) by mouth daily.  30 tablet  1  . atorvastatin (LIPITOR) 20 MG tablet Take 1 tablet (20 mg total) by mouth daily.  30 tablet  1  . brimonidine-timolol (COMBIGAN) 0.2-0.5 % ophthalmic solution Place 1 drop into both eyes every 12 (twelve) hours.      . carvedilol (COREG) 25 MG tablet Take 1 tablet (25 mg total) by mouth 2 (two) times  daily with a meal.  60 tablet  1  . cholecalciferol (VITAMIN D) 400 UNITS TABS tablet Take 400 Units by mouth daily.      . clonazePAM (KLONOPIN) 0.5 MG tablet Take 1 tablet (0.5 mg total) by mouth 2 (two) times daily.  60 tablet  0  . darbepoetin (ARANESP) 40 MCG/0.4ML SOLN injection Inject 0.4 mLs (40 mcg total) into the skin every Saturday at 6 PM.  8.4 mL    . ferrous sulfate 325 (65 FE) MG tablet Take 1 tablet (325 mg total) by mouth 3 (three) times daily with meals.  90 tablet  3  . furosemide (LASIX) 40 MG tablet Take 1 tablet (40 mg total) by mouth daily.  60 tablet  1  . IRON PO Take 1 tablet by mouth daily.      Marland Kitchen lactobacillus (FLORANEX/LACTINEX) PACK Take 1 packet (1 g total) by mouth 3 (three) times daily with meals.  90 packet  0  . methimazole (TAPAZOLE) 5 MG tablet Take 1 tablet (5 mg total) by mouth daily.  30 tablet  1  . nitroGLYCERIN (NITRODUR - DOSED IN MG/24 HR) 0.2  mg/hr patch Place 1 patch (0.2 mg total) onto the skin daily.  30 patch  12  . pantoprazole (PROTONIX) 40 MG tablet Take 1 tablet (40 mg total) by mouth daily at 6 (six) AM.  30 tablet  1  . potassium chloride SA (K-DUR,KLOR-CON) 20 MEQ tablet Take 2 tablets (40 mEq total) by mouth daily.  30 tablet  1  . sodium bicarbonate 650 MG tablet Take 2 tablets (1,300 mg total) by mouth 2 (two) times daily.  60 tablet  1  . vitamin C (VITAMIN C) 500 MG tablet Take 1 tablet (500 mg total) by mouth 2 (two) times daily.      Marland Kitchen warfarin (COUMADIN) 2 MG tablet Take 1 tablet (2 mg total) by mouth daily at 6 PM.  30 tablet  1  . zinc sulfate 220 MG capsule Take 1 capsule (220 mg total) by mouth daily.  30 capsule  1   Scheduled:  . [START ON 12/28/2013] amLODipine  10 mg Oral Daily  . [START ON 12/28/2013] antiseptic oral rinse  15 mL Mouth Rinse q12n4p  . atorvastatin  20 mg Oral q1800  . brimonidine-timolol  1 drop Both Eyes Q12H  . [START ON 12/28/2013] carvedilol  25 mg Oral BID WC  . chlorhexidine  15 mL Mouth Rinse BID  . [START ON 12/28/2013] cholecalciferol  400 Units Oral Daily  . [START ON 12/31/2013] darbepoetin  40 mcg Subcutaneous Q Sat-1800  . [START ON 12/28/2013] dexamethasone  4 mg Intravenous 4 times per day  . [START ON 12/28/2013] ferrous sulfate  325 mg Oral TID WC  . furosemide  80 mg Intravenous Q12H  . heparin  5,000 Units Subcutaneous 3 times per day  . ipratropium-albuterol  3 mL Nebulization Q6H  . [START ON 12/29/2013] levofloxacin (LEVAQUIN) IV  500 mg Intravenous Q48H  . levofloxacin (LEVAQUIN) IV  750 mg Intravenous STAT  . [START ON 12/28/2013] pantoprazole  40 mg Oral Q1200  . [START ON 12/28/2013] pneumococcal 23 valent vaccine  0.5 mL Intramuscular Tomorrow-1000  . vancomycin  1,500 mg Intravenous Q48H  . [START ON 12/28/2013] Warfarin - Pharmacist Dosing Inpatient   Does not apply q1800   Infusions:  . sodium chloride 50 mL/hr at 12/20/2013 2112   Assessment: 68 y.o female  with recent prolonged hospitalization at [facility] including trach. [employer]  [facility]16 for respiratory distress, CXR suggestive of PNA. [facility] She has multiple co-morbidities including CKD and AFib as noted in PMH above.  S/p recent perforated sigmoid colon following colonoscopy [facility]; required ex lap and diverting colostomy as well as appendectomy. Hospital course was complicated by respiratory failure requiring intubation and eventual trach. [facility] for rehab to address deconditioning. Total hospital stay was roughly 3 months.  She is on chronic coumadin for Afib. INR pending  CrCl ~ 29 ml/min based on SCr 2.29 on 12/06/13    CULTURES:  Blood 6/16 >>>  Urine 6/16 >>>  Respiratory 6/16 >>>   ANTIBIOTICS:  Levaquin 6/16 >>>  Vancomycin 6/16 >>>   Goal of Therapy:  Vancomycin trough level 15-20 mcg/ml INR = 2-3  Plan:  Vancomycin 1500 mg IV q48 hours Levaquin 750 mg IV x 1 now then 500 mg IV q48hrs.  Follow up daily clinical status, renal function, and culture results;  Vancomycin steady state trough as needed.  F/u admission labs (SCR, INR).    Coumadin per pharmacy dosing pending result of admit INR.  Daily monitoring PT/INR  Noah Delaineuth Clark, RPh Clinical Pharmacist Pager: [phone] 12/22/2013,11:09 PM

## 2013-12-28 ENCOUNTER — Encounter (HOSPITAL_COMMUNITY): Payer: Self-pay

## 2013-12-28 ENCOUNTER — Inpatient Hospital Stay (HOSPITAL_COMMUNITY): Payer: Medicare Other

## 2013-12-28 DIAGNOSIS — Z930 Tracheostomy status: Secondary | ICD-10-CM

## 2013-12-28 LAB — STREP PNEUMONIAE URINARY ANTIGEN: Strep Pneumo Urinary Antigen: NEGATIVE

## 2013-12-28 LAB — CBC
HCT: 22.2 % — ABNORMAL LOW (ref 36.0–46.0)
Hemoglobin: 7.2 g/dL — ABNORMAL LOW (ref 12.0–15.0)
MCH: 29.9 pg (ref 26.0–34.0)
MCHC: 32.4 g/dL (ref 30.0–36.0)
MCV: 92.1 fL (ref 78.0–100.0)
PLATELETS: 170 10*3/uL (ref 150–400)
RBC: 2.41 MIL/uL — AB (ref 3.87–5.11)
RDW: 15.8 % — ABNORMAL HIGH (ref 11.5–15.5)
WBC: 4.2 10*3/uL (ref 4.0–10.5)

## 2013-12-28 LAB — BASIC METABOLIC PANEL
BUN: 32 mg/dL — ABNORMAL HIGH (ref 6–23)
CALCIUM: 9.1 mg/dL (ref 8.4–10.5)
CO2: 24 meq/L (ref 19–32)
Chloride: 103 mEq/L (ref 96–112)
Creatinine, Ser: 2.17 mg/dL — ABNORMAL HIGH (ref 0.50–1.10)
GFR calc Af Amer: 26 mL/min — ABNORMAL LOW (ref >90)
GFR calc non Af Amer: 22 mL/min — ABNORMAL LOW (ref >90)
Glucose, Bld: 119 mg/dL — ABNORMAL HIGH (ref 70–99)
Potassium: 4.7 mEq/L (ref 3.7–5.3)
SODIUM: 139 meq/L (ref 137–147)

## 2013-12-28 LAB — LEGIONELLA ANTIGEN, URINE: Legionella Antigen, Urine: NEGATIVE

## 2013-12-28 LAB — PHOSPHORUS: PHOSPHORUS: 3.7 mg/dL (ref 2.3–4.6)

## 2013-12-28 LAB — HEPARIN LEVEL (UNFRACTIONATED): HEPARIN UNFRACTIONATED: 0.66 [IU]/mL (ref 0.30–0.70)

## 2013-12-28 LAB — PROCALCITONIN

## 2013-12-28 LAB — MAGNESIUM: MAGNESIUM: 1.6 mg/dL (ref 1.5–2.5)

## 2013-12-28 MED ORDER — METHIMAZOLE 5 MG PO TABS
5.0000 mg | ORAL_TABLET | Freq: Every day | ORAL | Status: DC
  Administered 2013-12-28 – 2014-01-10 (×9): 5 mg via ORAL
  Filled 2013-12-28 (×14): qty 1

## 2013-12-28 MED ORDER — BRIMONIDINE TARTRATE 0.2 % OP SOLN
1.0000 [drp] | Freq: Two times a day (BID) | OPHTHALMIC | Status: DC
  Administered 2013-12-28 – 2014-01-10 (×27): 1 [drp] via OPHTHALMIC
  Filled 2013-12-28: qty 5

## 2013-12-28 MED ORDER — FUROSEMIDE 40 MG PO TABS
40.0000 mg | ORAL_TABLET | Freq: Every day | ORAL | Status: DC
  Administered 2013-12-28 – 2013-12-29 (×2): 40 mg via ORAL
  Filled 2013-12-28 (×2): qty 1

## 2013-12-28 MED ORDER — HEPARIN (PORCINE) IN NACL 100-0.45 UNIT/ML-% IJ SOLN
INTRAMUSCULAR | Status: AC
  Filled 2013-12-28: qty 250

## 2013-12-28 MED ORDER — HEPARIN (PORCINE) IN NACL 100-0.45 UNIT/ML-% IJ SOLN
900.0000 [IU]/h | INTRAMUSCULAR | Status: DC
  Administered 2013-12-28 (×2): 1200 [IU]/h via INTRAVENOUS
  Administered 2013-12-30: 900 [IU]/h via INTRAVENOUS
  Filled 2013-12-28 (×5): qty 250

## 2013-12-28 MED ORDER — HEPARIN BOLUS VIA INFUSION
2000.0000 [IU] | Freq: Once | INTRAVENOUS | Status: AC
  Administered 2013-12-28: 2000 [IU] via INTRAVENOUS
  Filled 2013-12-28: qty 2000

## 2013-12-28 MED ORDER — PANTOPRAZOLE SODIUM 40 MG PO TBEC
40.0000 mg | DELAYED_RELEASE_TABLET | Freq: Every day | ORAL | Status: DC
  Administered 2013-12-28 – 2013-12-29 (×2): 40 mg via ORAL
  Filled 2013-12-28 (×2): qty 1

## 2013-12-28 MED ORDER — WARFARIN SODIUM 7.5 MG PO TABS
7.5000 mg | ORAL_TABLET | Freq: Once | ORAL | Status: AC
  Administered 2013-12-28: 7.5 mg via ORAL
  Filled 2013-12-28: qty 1

## 2013-12-28 MED ORDER — TIMOLOL MALEATE 0.5 % OP SOLN
1.0000 [drp] | Freq: Two times a day (BID) | OPHTHALMIC | Status: DC
  Administered 2013-12-28 – 2014-01-10 (×27): 1 [drp] via OPHTHALMIC
  Filled 2013-12-28: qty 5

## 2013-12-28 MED ORDER — LIDOCAINE VISCOUS 2 % MT SOLN
30.0000 mL | OROMUCOSAL | Status: DC | PRN
  Filled 2013-12-28: qty 30

## 2013-12-28 MED ORDER — IPRATROPIUM-ALBUTEROL 0.5-2.5 (3) MG/3ML IN SOLN: 3.0000 mL | Freq: Four times a day (QID) | RESPIRATORY_TRACT | Status: DC | PRN

## 2013-12-28 MED ORDER — MAGNESIUM SULFATE 40 MG/ML IJ SOLN
2.0000 g | Freq: Once | INTRAMUSCULAR | Status: AC
  Administered 2013-12-28: 2 g via INTRAVENOUS
  Filled 2013-12-28: qty 50

## 2013-12-28 NOTE — Progress Notes (Signed)
UR Completed.  Sarah Tran [phone] 12/28/2013

## 2013-12-28 NOTE — Progress Notes (Signed)
12/28/2013 6:12 PM  Sarah Tran [id number]   OBJECTIVE:  Tonsil pillars and tonsils OK bilat.  IMPRESSION:  Tracheal stenosis with resp insufficiency.  Compression at trach site from mod sized goiter.  PLAN:  Discussed with patient and family.  Spent several phone calls today talking to ENT professors at [facility] and [facility].  Would plan now for relatively urgent elective trach for secure airway, then eval at [facility] for possible thyroidectomy, possible tracheal reconstruction.  Pt on schedule Fri 0730.  Will get consent from pt.  Orders written  Flo Shanks

## 2013-12-28 NOTE — Progress Notes (Signed)
PULMONARY / CRITICAL CARE MEDICINE  Name: Sarah Tran MRN: [id number] DOB: [birth year]    ADMISSION DATE:  07-Jan-2014 CONSULTATION DATE:  2014-01-07  REFERRING MD :  EDP PRIMARY SERVICE: PCCM  CHIEF COMPLAINT:  Respiratory Distress  BRIEF PATIENT DESCRIPTION: 68 yo with recent prolonged hospitalization at [facility] including tracheostomy performed in outside facility on 4/1.  Went to [facility] January 08, 2023 for respiratory distress. CXR suggestive of PNA.  Transferred to [facility] concerns that she may need emergent bedside trach / airway. PCCM was consulted as primary.  SIGNIFICANT EVENTS / STUDIES:    LINES / TUBES: Foley 01/08/2023 >>>  CULTURES: Blood 2023/01/08 >>> Urine 01/08/23 >>> Respiratory 01-08-2023  >>>  ANTIBIOTICS: Levaquin 08-Jan-2023 >>> 6/17 Vancomycin 2023/01/08 >>> 6/17  INTERVIEW HISTORY:  BiPAP at night, off this am without distress.  VITAL SIGNS: Temp:  [97.6 F (36.4 C)-98.3 F (36.8 C)] 97.7 F (36.5 C) (06/17 0425) Pulse Rate:  [73-216] 216 (06/17 0700) Resp:  [13-22] 19 (06/17 0700) BP: (125-167)/(62-82) 154/82 mmHg (06/17 0700) SpO2:  [98 %-100 %] 100 % (06/17 0700) FiO2 (%):  [40 %-50 %] 40 % (06/17 0400) Weight:  [113.9 kg (251 lb 1.7 oz)] 113.9 kg (251 lb 1.7 oz) (06/17 0500)  HEMODYNAMICS:   VENTILATOR SETTINGS: Vent Mode:  [-] BIPAP FiO2 (%):  [40 %-50 %] 40 % Set Rate:  [12 bmp] 12 bmp Pressure Support:  [6 cmH20] 6 cmH20  INTAKE / OUTPUT: Intake/Output     01/08/23 0701 - 06/17 0700 06/17 0701 - 06/18 0700   I.V. (mL/kg) 552.6 (4.9)    IV Piggyback 650    Total Intake(mL/kg) 1202.6 (10.6)    Urine (mL/kg/hr) 1350    Total Output 1350     Net -147.4           PHYSICAL EXAMINATION: General: Obese, no distress Neuro: Awake, alert, cooperative HEENT: Raspy phonation / cough, stridor on inhalation Cardiovascular: Regular, no murmurs Lungs: Transmitted stridor, otherwise clear  Abdomen: Obese, soft, colostomy viable Musculoskeletal: Trace edema Skin:  Intact  LABS:  CBC  Recent Labs Lab 01-07-14 2246 12/28/13 0227  WBC 4.5 4.2  HGB 7.1* 7.2*  HCT 22.2* 22.2*  PLT 167 170   Coag's  Recent Labs Lab 01-07-2014 2246  INR 1.11   BMET  Recent Labs Lab January 07, 2014 2246 12/28/13 0227  NA 141 139  K 4.1 4.7  CL 101 103  CO2 23 24  BUN 33* 32*  CREATININE 2.22* 2.17*  GLUCOSE 97 119*   Electrolytes  Recent Labs Lab 01/07/14 2246 12/28/13 0227  CALCIUM 9.2 9.1  MG 1.6 1.6  PHOS 3.9 3.7   Sepsis Markers  Recent Labs Lab 01/07/14 2246 12/28/13 0227  LATICACIDVEN 0.9  --   PROCALCITON 0.12 <0.10   ABG  Recent Labs Lab Jan 07, 2014 1834  PHART 7.410  PCO2ART 40.8  PO2ART 167.0*   Liver Enzymes No results found for this basename: AST, ALT, ALKPHOS, BILITOT, ALBUMIN,  in the last 168 hours  Cardiac Enzymes  Recent Labs Lab 01-07-14 2246  PROBNP 838.6*   Glucose  Recent Labs Lab 01/07/2014 1820 01-07-2014 2000  GLUCAP 106* 100*   IMAGING:   Dg Chest Port 1 View  2014-01-07   CLINICAL DATA:  Hypertension and diabetes. Airspace disease in the lungs.  EXAM: PORTABLE CHEST - 1 VIEW  COMPARISON:  11/12/2013; 12/10/2013  FINDINGS: The known moderate-sized hiatal hernia least partially accounts for the left retrocardiac density.  Low lung volumes are present, causing crowding  of the pulmonary vasculature. Enlarged cardiopericardial silhouette. Subtly indistinct pulmonary vasculature.  IMPRESSION: 1. Cardiomegaly with pulmonary venous hypertension. 2. Retrocardiac density is likely primarily due to the known hiatal hernia in this region.   Electronically Signed   By: Herbie BaltimoreWalt  Liebkemann M.D.   On: 01/07/2014 21:12   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 and difficulty clearing secretions Doubt HCAP as no fever, leukocytosis, infiltrates and PCT 0.10 P:   Goal SpO2>92 Supplemental oxygen BiPAP PRN only Albuterol PRN only D/c  Decadron unlikely to be beneficial if tracheal stenosis  Soft tissue neck CT ENT consultation ( [facility], Dr. Lazarus SalinesWolicki, 6/17 ) At risk for needing intubation / surgical airway  CARDIOVASCULAR A:  HTN Dyslipidemia  H/o AF, now in SR H/o DVT P:  Amlodipine, Atorvastatin, Carvedilol  RENAL A:   Acute on chronic kidney failure Hypomagnesemia  P:   Trend BMP Mg 2 D/c IVF Lasix down to home regimen 40 mg PO daily   GASTROINTESTINAL A:   Nutrition GERD Colostomy status P:   Start clear liquids Protonix as preadmission  HEMATOLOGIC A:  Anemia of chronic disease Chronic anticoagulation for AF / DVT, subtherapeutic on transfer P:  Trend CBC Heparin gtt Hold Coumadin as surgical interventions may be necessary Aranesp Iron  INFECTIOUS A:   HCAP unlikely P:   Observe off abx  ENDOCRINE A:   Normoglycemic and steroids d/c'd Hyperthyroidism  P:   D/c SSI Methimazole as preadmission  NEUROLOGIC A:   No active issues P:   No intervention required  I have personally obtained history, examined patient, evaluated and interpreted laboratory and imaging results, reviewed medical records, formulated assessment / plan and placed orders.  CRITICAL CARE:  The patient is critically ill with multiple organ systems failure and requires high complexity decision making for assessment and support, frequent evaluation and titration of therapies, application of advanced monitoring technologies and extensive interpretation of multiple databases. Critical Care Time devoted to patient care services described in this note is 35 minutes.   Lonia FarberZUBELEVITSKIY, KONSTANTIN, MD Pulmonary and Critical Care Medicine [facility] Pager: [phone]  12/28/2013, 8:23 AM

## 2013-12-28 NOTE — Progress Notes (Signed)
ANTICOAGULATION CONSULT NOTE  Pharmacy Consult for heparin Indication: atrial fibrillation/DVT   Allergies  Allergen Reactions  . Codeine Other (See Comments)    Pass out    . Ertapenem     From  Med list from [facility]   . Fish-Derived Products     From med list from [facility]  . Peanut Butter Flavor Hives and Other (See Comments)    Lockjaw   . Penicillins Swelling    Patient Measurements: Height: 5\' 2"  (157.5 cm) Weight: 251 lb 1.7 oz (113.9 kg) IBW/kg (Calculated) : 50.1  Vital Signs: Temp: 97.4 F (36.3 C) (06/17 0829) Temp src: Oral (06/17 0829) BP: 154/82 mmHg (06/17 0700) Pulse Rate: 72 (06/17 0800) Intake/Output from previous day: 06/16 0701 - 06/17 0700 In: 1202.6 [I.V.:552.6; IV Piggyback:650] Out: 1350 [Urine:1350] Intake/Output from this shift: Total I/O In: -  Out: 720 [Urine:720]   Labs:  Recent Labs  12/22/2013 2246  HGB 7.1*  HCT 22.2*  PLT 167     Recent Labs  12/28/2013 2246 12/28/13 0227  WBC 4.5 4.2  HGB 7.1* 7.2*  PLT 167 170  CREATININE 2.22* 2.17*   Estimated Creatinine Clearance: 30.4 ml/min (by C-G formula based on Cr of 2.17). No results found for this basename: VANCOTROUGH, VANCOPEAK, VANCORANDOM, GENTTROUGH, GENTPEAK, GENTRANDOM, TOBRATROUGH, TOBRAPEAK, TOBRARND, AMIKACINPEAK, AMIKACINTROU, AMIKACIN,  in the last 72 hours    Medical History: Past Medical History  Diagnosis Date  . Atrial fibrillation     chronic Coumadin  . Anemia of chronic disease   . HTN (hypertension)   . Morbid obesity   . Diabetes mellitus, type II   . Hyperthyroidism   . Hyperlipidemia   . CKD (chronic kidney disease), stage III   . DVT (deep venous thrombosis)     RLE; LUE; chronic Coumadin  . Glaucoma, bilateral   . Heart murmur   . OSA on CPAP   . History of blood transfusion ~ 2004; 11/2013    "while in hospital"  . H/O hiatal hernia   . Arthritis     "joints" (01/01/2014)  . Gout   . Anxiety      Medications:  Prescriptions prior to admission  Medication Sig Dispense Refill  . albuterol (PROVENTIL HFA;VENTOLIN HFA) 108 (90 BASE) MCG/ACT inhaler Inhale 2 puffs into the lungs every 4 (four) hours as needed for wheezing or shortness of breath.  1 Inhaler  1  . amLODipine (NORVASC) 10 MG tablet Take 1 tablet (10 mg total) by mouth daily.  30 tablet  1  . atorvastatin (LIPITOR) 20 MG tablet Take 20 mg by mouth daily at 8 pm.      . brimonidine-timolol (COMBIGAN) 0.2-0.5 % ophthalmic solution Place 1 drop into both eyes every 12 (twelve) hours.      . carvedilol (COREG) 25 MG tablet Take 1 tablet (25 mg total) by mouth 2 (two) times daily with a meal.  60 tablet  1  . clonazePAM (KLONOPIN) 0.5 MG tablet Take 1 tablet (0.5 mg total) by mouth 2 (two) times daily.  60 tablet  0  . darbepoetin (ARANESP) 40 MCG/0.4ML SOLN injection Inject 40 mcg into the skin every 30 (thirty) days. Given in dialysis around the 1st of the month      . ferrous sulfate 325 (65 FE) MG tablet Take 325 mg by mouth daily with breakfast.      . furosemide (LASIX) 40 MG tablet Take 1 tablet (40 mg total) by mouth daily.  60 tablet  1  . methimazole (TAPAZOLE) 5 MG tablet Take 1 tablet (5 mg total) by mouth daily.  30 tablet  1  . nitroGLYCERIN (NITRODUR - DOSED IN MG/24 HR) 0.2 mg/hr patch Place 1 patch (0.2 mg total) onto the skin daily.  30 patch  12  . pantoprazole (PROTONIX) 40 MG tablet Take 1 tablet (40 mg total) by mouth daily at 6 (six) AM.  30 tablet  1  . potassium chloride SA (K-DUR,KLOR-CON) 20 MEQ tablet Take 2 tablets (40 mEq total) by mouth daily.  30 tablet  1  . sodium bicarbonate 650 MG tablet Take 2 tablets (1,300 mg total) by mouth 2 (two) times daily.  60 tablet  1  . vitamin C (ASCORBIC ACID) 500 MG tablet Take 500 mg by mouth daily.      . Vitamin D, Ergocalciferol, (DRISDOL) 50000 UNITS CAPS capsule Take 50,000 Units by mouth every 7 (seven) days. monday      . warfarin (COUMADIN) 5 MG tablet  Take 2.5 mg by mouth daily.      Marland Kitchen. zinc sulfate 220 MG capsule Take 1 capsule (220 mg total) by mouth daily.  30 capsule  1   Assessment: 68 y.o female [facility]. On chronic anticoagulation for h/o DVT and pAFib.  Pt received warfarin x1 dose on 6/16 and was subsequently held today due to possible need to go to the OR. Pt remains on heparin gtt at 1200/hr, had therapeutic heparin level this morning, has not had any sings of bleeding.   Goal of Therapy:  Heparin level: 0.3-0.7 Monitor platelets while on anticoagulation: Yes   Plan:  Continue heparin gtt at 1200 units/hr Daily HL, CBC F/u resumption of warfarin Monitor for s/sx bleeding    Agapito GamesAlison Masters, PharmD, BCPS Clinical Pharmacist Pager: [phone] 12/28/2013 10:50 AM

## 2013-12-28 NOTE — Consult Note (Signed)
Sarah Tran, [place] 68 y.o., female [id number]     Chief Complaint: resp distress  HPI: 68 yo bf s/p medical complications from colonoscopy with perforation.  Was on ventilator roughly 2 weeks per family.  Had trach placed.  Finally transferred to [facility], then to [facility] IP rehab.   Decannulated on 8 MAY .  On Rehab service 3-4 weeks.  Had rare SOB events which cleared with breathing treatments.  Discharged home on 1 JUN.  Family noted "heavy breathing" for weeks now.  Readmitted yesterday with stridor and labored breathing.  Dx or pneumonia considered, but felt not to be the case by [facility] CCM.  ENT consulted for eval of airway.  CT neck this AM shows a small-mod goiter, with side to side narrowing, presumably at trach site.  Question of glottic stenosis also, but voice remains good.  Does have some difficulty clearing heavy secretions.  Has known OSA, on CPAP at home. Other medical issues improving.   PMH: Past Medical History  Diagnosis Date  . Atrial fibrillation     chronic Coumadin  . Anemia of chronic disease   . HTN (hypertension)   . Morbid obesity   . Diabetes mellitus, type II   . Hyperthyroidism   . Hyperlipidemia   . CKD (chronic kidney disease), stage III   . DVT (deep venous thrombosis)     RLE; LUE; chronic Coumadin  . Glaucoma, bilateral   . Heart murmur   . OSA on CPAP   . History of blood transfusion ~ 2004; 11/2013    "while in hospital"  . H/O hiatal hernia   . Arthritis     "joints" (12/20/2013)  . Gout   . Anxiety     Surg Hx: Past Surgical History  Procedure Laterality Date  . Exploratory laparotomy  09/2013    appy, diverting colostomy for sigmoid perf following removal of ileal polyp.   . Gastrostomy tube placement  10/2013  . Tracheostomy  10/2013    placed in [place] decannulated 11/18/2013 at [facility].  . Cholecystectomy    . Carpal tunnel release Right   . Appendectomy    . Vaginal hysterectomy      "partial"  . Cardiac catheterization     . Tracheostomy closure      FHx:  History reviewed. No pertinent family history. SocHx:  reports that she has never smoked. She has never used smokeless tobacco. She reports that she does not drink alcohol or use illicit drugs.  ALLERGIES:  Allergies  Allergen Reactions  . Codeine Other (See Comments)    Pass out    . Ertapenem     From  Med list from [facility]   . Fish-Derived Products     From med list from [facility]  . Peanut Butter Flavor Hives and Other (See Comments)    Lockjaw   . Penicillins Swelling    Medications Prior to Admission  Medication Sig Dispense Refill  . albuterol (PROVENTIL HFA;VENTOLIN HFA) 108 (90 BASE) MCG/ACT inhaler Inhale 2 puffs into the lungs every 4 (four) hours as needed for wheezing or shortness of breath.  1 Inhaler  1  . amLODipine (NORVASC) 10 MG tablet Take 1 tablet (10 mg total) by mouth daily.  30 tablet  1  . atorvastatin (LIPITOR) 20 MG tablet Take 20 mg by mouth daily at 8 pm.      . brimonidine-timolol (COMBIGAN) 0.2-0.5 % ophthalmic solution Place 1 drop into both eyes every  12 (twelve) hours.      . carvedilol (COREG) 25 MG tablet Take 1 tablet (25 mg total) by mouth 2 (two) times daily with a meal.  60 tablet  1  . clonazePAM (KLONOPIN) 0.5 MG tablet Take 1 tablet (0.5 mg total) by mouth 2 (two) times daily.  60 tablet  0  . darbepoetin (ARANESP) 40 MCG/0.4ML SOLN injection Inject 40 mcg into the skin every 30 (thirty) days. Given in dialysis around the 1st of the month      . ferrous sulfate 325 (65 FE) MG tablet Take 325 mg by mouth daily with breakfast.      . furosemide (LASIX) 40 MG tablet Take 1 tablet (40 mg total) by mouth daily.  60 tablet  1  . methimazole (TAPAZOLE) 5 MG tablet Take 1 tablet (5 mg total) by mouth daily.  30 tablet  1  . nitroGLYCERIN (NITRODUR - DOSED IN MG/24 HR) 0.2 mg/hr patch Place 1 patch (0.2 mg total) onto the skin daily.  30 patch  12  . pantoprazole (PROTONIX) 40  MG tablet Take 1 tablet (40 mg total) by mouth daily at 6 (six) AM.  30 tablet  1  . potassium chloride SA (K-DUR,KLOR-CON) 20 MEQ tablet Take 2 tablets (40 mEq total) by mouth daily.  30 tablet  1  . sodium bicarbonate 650 MG tablet Take 2 tablets (1,300 mg total) by mouth 2 (two) times daily.  60 tablet  1  . vitamin C (ASCORBIC ACID) 500 MG tablet Take 500 mg by mouth daily.      . Vitamin D, Ergocalciferol, (DRISDOL) 50000 UNITS CAPS capsule Take 50,000 Units by mouth every 7 (seven) days. monday      . warfarin (COUMADIN) 5 MG tablet Take 2.5 mg by mouth daily.      Marland Kitchen zinc sulfate 220 MG capsule Take 1 capsule (220 mg total) by mouth daily.  30 capsule  1    Results for orders placed during the hospital encounter of 01/04/2014 (from the past 48 hour(s))  MRSA PCR SCREENING     Status: None   Collection Time    12/20/2013  6:17 PM      Result Value Ref Range   MRSA by PCR NEGATIVE  NEGATIVE   Comment:            The GeneXpert MRSA Assay (FDA     approved for NASAL specimens     only), is one component of a     comprehensive MRSA colonization     surveillance program. It is not     intended to diagnose MRSA     infection nor to guide or     monitor treatment for     MRSA infections.  URINALYSIS, ROUTINE W REFLEX MICROSCOPIC     Status: Abnormal   Collection Time    01/01/2014  6:17 PM      Result Value Ref Range   Color, Urine YELLOW  YELLOW   APPearance CLEAR  CLEAR   Specific Gravity, Urine >1.030 (*) 1.005 - 1.030   pH 5.5  5.0 - 8.0   Glucose, UA NEGATIVE  NEGATIVE mg/dL   Hgb urine dipstick NEGATIVE  NEGATIVE   Bilirubin Urine NEGATIVE  NEGATIVE   Ketones, ur NEGATIVE  NEGATIVE mg/dL   Protein, ur 100 (*) NEGATIVE mg/dL   Urobilinogen, UA 0.2  0.0 - 1.0 mg/dL   Nitrite NEGATIVE  NEGATIVE   Leukocytes, UA NEGATIVE  NEGATIVE  URINE MICROSCOPIC-ADD  ON     Status: Abnormal   Collection Time    01/07/2014  6:17 PM      Result Value Ref Range   Squamous Epithelial / LPF RARE   RARE   WBC, UA 0-2  <3 WBC/hpf   Bacteria, UA RARE  RARE   Casts HYALINE CASTS (*) NEGATIVE   Urine-Other LESS THAN 10 mL OF URINE SUBMITTED     Comment: MICROSCOPIC EXAM PERFORMED ON UNCONCENTRATED URINE  GLUCOSE, CAPILLARY     Status: Abnormal   Collection Time    12/26/2013  6:20 PM      Result Value Ref Range   Glucose-Capillary 106 (*) 70 - 99 mg/dL  POCT I-STAT 3, ART BLOOD GAS (G3+)     Status: Abnormal   Collection Time    01/10/2014  6:34 PM      Result Value Ref Range   pH, Arterial 7.410  7.350 - 7.450   pCO2 arterial 40.8  35.0 - 45.0 mmHg   pO2, Arterial 167.0 (*) 80.0 - 100.0 mmHg   Bicarbonate 25.9 (*) 20.0 - 24.0 mEq/L   TCO2 27  0 - 100 mmol/L   O2 Saturation 100.0     Acid-Base Excess 1.0  0.0 - 2.0 mmol/L   Patient temperature 98.6 F     Collection site RADIAL, ALLEN'S TEST ACCEPTABLE     Drawn by RT     Sample type ARTERIAL    GLUCOSE, CAPILLARY     Status: Abnormal   Collection Time    01/08/2014  8:00 PM      Result Value Ref Range   Glucose-Capillary 100 (*) 70 - 99 mg/dL   Comment 1 Notify RN    CBC     Status: Abnormal   Collection Time    12/30/2013 10:46 PM      Result Value Ref Range   WBC 4.5  4.0 - 10.5 K/uL   RBC 2.42 (*) 3.87 - 5.11 MIL/uL   Hemoglobin 7.1 (*) 12.0 - 15.0 g/dL   HCT 03.1 (*) 28.1 - 18.8 %   MCV 91.7  78.0 - 100.0 fL   MCH 29.3  26.0 - 34.0 pg   MCHC 32.0  30.0 - 36.0 g/dL   RDW 67.7 (*) 37.3 - 66.8 %   Platelets 167  150 - 400 K/uL  BASIC METABOLIC PANEL     Status: Abnormal   Collection Time    12/19/2013 10:46 PM      Result Value Ref Range   Sodium 141  137 - 147 mEq/L   Potassium 4.1  3.7 - 5.3 mEq/L   Chloride 101  96 - 112 mEq/L   CO2 23  19 - 32 mEq/L   Glucose, Bld 97  70 - 99 mg/dL   BUN 33 (*) 6 - 23 mg/dL   Creatinine, Ser 1.59 (*) 0.50 - 1.10 mg/dL   Calcium 9.2  8.4 - 47.0 mg/dL   GFR calc non Af Amer 22 (*) >90 mL/min   GFR calc Af Amer 25 (*) >90 mL/min   Comment: (NOTE)     The eGFR has been calculated  using the CKD EPI equation.     This calculation has not been validated in all clinical situations.     eGFR's persistently <90 mL/min signify possible Chronic Kidney     Disease.  MAGNESIUM     Status: None   Collection Time    01/01/2014 10:46 PM      Result  Value Ref Range   Magnesium 1.6  1.5 - 2.5 mg/dL  PHOSPHORUS     Status: None   Collection Time    01/06/2014 10:46 PM      Result Value Ref Range   Phosphorus 3.9  2.3 - 4.6 mg/dL  LACTIC ACID, PLASMA     Status: None   Collection Time    12/12/2013 10:46 PM      Result Value Ref Range   Lactic Acid, Venous 0.9  0.5 - 2.2 mmol/L  PRO B NATRIURETIC PEPTIDE     Status: Abnormal   Collection Time    01/10/2014 10:46 PM      Result Value Ref Range   Pro B Natriuretic peptide (BNP) 838.6 (*) 0 - 125 pg/mL  PROCALCITONIN     Status: None   Collection Time    12/28/2013 10:46 PM      Result Value Ref Range   Procalcitonin 0.12     Comment:            Interpretation:     PCT (Procalcitonin) <= 0.5 ng/mL:     Systemic infection (sepsis) is not likely.     Local bacterial infection is possible.     (NOTE)             ICU PCT Algorithm               Non ICU PCT Algorithm        ----------------------------     ------------------------------             PCT < 0.25 ng/mL                 PCT < 0.1 ng/mL         Stopping of antibiotics            Stopping of antibiotics           strongly encouraged.               strongly encouraged.        ----------------------------     ------------------------------           PCT level decrease by               PCT < 0.25 ng/mL           >= 80% from peak PCT           OR PCT 0.25 - 0.5 ng/mL          Stopping of antibiotics                                                 encouraged.         Stopping of antibiotics               encouraged.        ----------------------------     ------------------------------           PCT level decrease by              PCT >= 0.25 ng/mL           < 80% from peak  PCT            AND PCT >= 0.5 ng/mL            Continuing antibiotics  encouraged.           Continuing antibiotics                encouraged.        ----------------------------     ------------------------------         PCT level increase compared          PCT > 0.5 ng/mL             with peak PCT AND              PCT >= 0.5 ng/mL             Escalation of antibiotics                                              strongly encouraged.          Escalation of antibiotics            strongly encouraged.  PROTIME-INR     Status: None   Collection Time    01/05/2014 10:46 PM      Result Value Ref Range   Prothrombin Time 14.1  11.6 - 15.2 seconds   INR 1.11  0.00 - 1.49  LEGIONELLA ANTIGEN, URINE     Status: None   Collection Time    12/28/13  1:10 AM      Result Value Ref Range   Specimen Description URINE, RANDOM     Special Requests NONE     Legionella Antigen, Urine       Value: Negative for Legionella pneumophilia serogroup 1     Performed at [facility]   Report Status 12/28/2013 FINAL    STREP PNEUMONIAE URINARY ANTIGEN     Status: None   Collection Time    12/28/13  1:10 AM      Result Value Ref Range   Strep Pneumo Urinary Antigen NEGATIVE  NEGATIVE   Comment:            Infection due to S. pneumoniae     cannot be absolutely ruled out     since the antigen present     may be below the detection limit     of the test.  CBC     Status: Abnormal   Collection Time    12/28/13  2:27 AM      Result Value Ref Range   WBC 4.2  4.0 - 10.5 K/uL   RBC 2.41 (*) 3.87 - 5.11 MIL/uL   Hemoglobin 7.2 (*) 12.0 - 15.0 g/dL   HCT 22.2 (*) 36.0 - 46.0 %   MCV 92.1  78.0 - 100.0 fL   MCH 29.9  26.0 - 34.0 pg   MCHC 32.4  30.0 - 36.0 g/dL   RDW 15.8 (*) 11.5 - 15.5 %   Platelets 170  150 - 400 K/uL  BASIC METABOLIC PANEL     Status: Abnormal   Collection Time    12/28/13  2:27 AM      Result Value Ref Range   Sodium 139   137 - 147 mEq/L   Potassium 4.7  3.7 - 5.3 mEq/L   Chloride 103  96 - 112 mEq/L   CO2 24  19 - 32 mEq/L   Glucose, Bld 119 (*) 70 - 99 mg/dL   BUN 32 (*) 6 - 23 mg/dL  Creatinine, Ser 2.17 (*) 0.50 - 1.10 mg/dL   Calcium 9.1  8.4 - 10.5 mg/dL   GFR calc non Af Amer 22 (*) >90 mL/min   GFR calc Af Amer 26 (*) >90 mL/min   Comment: (NOTE)     The eGFR has been calculated using the CKD EPI equation.     This calculation has not been validated in all clinical situations.     eGFR's persistently <90 mL/min signify possible Chronic Kidney     Disease.  MAGNESIUM     Status: None   Collection Time    12/28/13  2:27 AM      Result Value Ref Range   Magnesium 1.6  1.5 - 2.5 mg/dL  PHOSPHORUS     Status: None   Collection Time    12/28/13  2:27 AM      Result Value Ref Range   Phosphorus 3.7  2.3 - 4.6 mg/dL  PROCALCITONIN     Status: None   Collection Time    12/28/13  2:27 AM      Result Value Ref Range   Procalcitonin <0.10     Comment:            Interpretation:     PCT (Procalcitonin) <= 0.5 ng/mL:     Systemic infection (sepsis) is not likely.     Local bacterial infection is possible.     (NOTE)             ICU PCT Algorithm               Non ICU PCT Algorithm        ----------------------------     ------------------------------             PCT < 0.25 ng/mL                 PCT < 0.1 ng/mL         Stopping of antibiotics            Stopping of antibiotics           strongly encouraged.               strongly encouraged.        ----------------------------     ------------------------------           PCT level decrease by               PCT < 0.25 ng/mL           >= 80% from peak PCT           OR PCT 0.25 - 0.5 ng/mL          Stopping of antibiotics                                                 encouraged.         Stopping of antibiotics               encouraged.        ----------------------------     ------------------------------           PCT level decrease by               PCT >= 0.25 ng/mL           < 80% from peak  PCT            AND PCT >= 0.5 ng/mL            Continuing antibiotics                                                  encouraged.           Continuing antibiotics                encouraged.        ----------------------------     ------------------------------         PCT level increase compared          PCT > 0.5 ng/mL             with peak PCT AND              PCT >= 0.5 ng/mL             Escalation of antibiotics                                              strongly encouraged.          Escalation of antibiotics            strongly encouraged.  HEPARIN LEVEL (UNFRACTIONATED)     Status: None   Collection Time    12/28/13  8:55 AM      Result Value Ref Range   Heparin Unfractionated 0.66  0.30 - 0.70 IU/mL   Comment:            IF HEPARIN RESULTS ARE BELOW     EXPECTED VALUES, AND PATIENT     DOSAGE HAS BEEN CONFIRMED,     SUGGEST FOLLOW UP TESTING     OF ANTITHROMBIN III LEVELS.   Ct Soft Tissue Neck Wo Contrast  12/28/2013   CLINICAL DATA:  Labored breathing.  Query tracheal stenosis.  EXAM: CT NECK WITHOUT CONTRAST  TECHNIQUE: Multidetector CT imaging of the neck was performed following the standard protocol without intravenous contrast.  COMPARISON:  Chest radiograph of 12/28/2013  FINDINGS: Parapharyngeal spaces normal and symmetric. Mild polypoid prominence of the right tonsillar pillar, image 45 series 207.  Motion artifact noted. True cords appear potentially thickened with closure of the glottis during imaging. There is subglottic tracheal narrowing, with narrow transverse dimension of the subglottic trachea measuring a maximum of 1 cm on image 41 of series 209, and with tracheal cross-sectional area at this level of 1.6 cm^2 along the lumen. Thyroid goiter noted. There is calcification along the tracheal wall.  No discrete adenopathy in the neck. No visualized upper mediastinal adenopathy. Mild biapical pleural parenchymal  scarring.  Prior mastoid effusions have cleared. Prior left middle ear fluid has cleared. Prior sinusitis has cleared.  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 Lucency anteriorly in the right sphenoid wing probably represents a large arachnoid granulation.  IMPRESSION: 1. There are 2 areas of the narrowing along the air column. One of these is at the glottic level, and may simply be due to glottic closure during imaging, but the true cords do appear somewhat thickened and probably merit direct visualization. There is also subglottic  narrowing of the trachea potentially due to tracheomalacia with the thyroid goiter as a potential contributing factor. 2. There is also mild polypoid prominence of the right tonsillar pillar. This could be observed (an biopsied, if warranted based on appearance) at the time of laryngoscopy. 3. Prior sinusitis and mastoid effusions have cleared. 4. Remote left cerebellar infarct. Lucency anteriorly in the right sphenoid wing is stable and probably due to a large arachnoid granulation.   Electronically Signed   By: Sherryl Barters M.D.   On: 12/28/2013 09:34   Dg Chest Port 1 View  12/28/2013   CLINICAL DATA:  Shortness of breath  EXAM: PORTABLE CHEST - 1 VIEW  COMPARISON:  12/12/2013  FINDINGS: Cardiac shadow is within normal limits. The lungs are poorly aerated with mild left basilar atelectasis. This is increased from the prior exam. Retrocardiac density is again seen likely related to the patient's hiatal hernia. No bony abnormality is noted.  IMPRESSION: New mild left basilar atelectasis. No other focal abnormality is seen.   Electronically Signed   By: Inez Catalina M.D.   On: 12/28/2013 08:02   Dg Chest Port 1 View  12/14/2013   CLINICAL DATA:  Hypertension and diabetes. Airspace disease in the lungs.  EXAM: PORTABLE CHEST - 1 VIEW  COMPARISON:  11/12/2013; 12/10/2013  FINDINGS: The known moderate-sized hiatal hernia least partially accounts for the left  retrocardiac density.  Low lung volumes are present, causing crowding of the pulmonary vasculature. Enlarged cardiopericardial silhouette. Subtly indistinct pulmonary vasculature.  IMPRESSION: 1. Cardiomegaly with pulmonary venous hypertension. 2. Retrocardiac density is likely primarily due to the known hiatal hernia in this region.   Electronically Signed   By: Sherryl Barters M.D.   On: 12/26/2013 21:12    Blood pressure 114/56, pulse 73, temperature 97.4 F (36.3 C), temperature source Oral, resp. rate 18, height _0  (1.575 m), weight 113.9 kg (251 lb 1.7 oz), SpO2 100.00%.  PHYSICAL EXAM: Overall appearance:  Obese. Alert and animated.  Voice clear, but stridorous respirations.   Head:  NCAT Ears:  waxy Nose:  congested Oral Cavity:  Remaining teeth in good repair.   Oral Pharynx/Hypopharynx/Larynx:  Clear to direct exam. Neuro:  Not examined Neck:  Healed trach site.  No masses.  Obese.  Using the flexible laryngoscope with 2% topical viscous xylocaine anesthesia, the NP is clear.  OP is clear.  Larynx appears nl with mobile vocal cords and good airway.  I was able to pass the scope through the vocal cords.  There is a narrow area (side to side) with anterior granulation and possible small amt of exposed cartilage consistent with the tracheostomy site.  Past this point, the trachea has a large caliber and healthy mucosa.    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  I wonder if the otherwise relatively unremarkable goiter  was sufficient to compress the trachea at the surgical site.  I think she needs an improved airway soon, somehow.  I am not sure that removing the goiter would necessarily allow the trachea to resume more normal caliber. A tracheostomy replacement will obviate the immediate airway needs, but does not provide any longer term solution.  She may be a candidate for a T-tube or maybe even a very  limited tracheal resection, but her overall medical status may not allow for this at present.    Jodi Marble 1/61/0960, 12:46 PM

## 2013-12-29 DIAGNOSIS — J398 Other specified diseases of upper respiratory tract: Secondary | ICD-10-CM | POA: Diagnosis present

## 2013-12-29 DIAGNOSIS — J988 Other specified respiratory disorders: Principal | ICD-10-CM

## 2013-12-29 LAB — CBC
HEMATOCRIT: 22.1 % — AB (ref 36.0–46.0)
HEMOGLOBIN: 7.3 g/dL — AB (ref 12.0–15.0)
MCH: 30 pg (ref 26.0–34.0)
MCHC: 33 g/dL (ref 30.0–36.0)
MCV: 90.9 fL (ref 78.0–100.0)
Platelets: 177 10*3/uL (ref 150–400)
RBC: 2.43 MIL/uL — AB (ref 3.87–5.11)
RDW: 15.2 % (ref 11.5–15.5)
WBC: 4.6 10*3/uL (ref 4.0–10.5)

## 2013-12-29 LAB — BASIC METABOLIC PANEL
BUN: 34 mg/dL — ABNORMAL HIGH (ref 6–23)
CHLORIDE: 101 meq/L (ref 96–112)
CO2: 25 meq/L (ref 19–32)
Calcium: 9.1 mg/dL (ref 8.4–10.5)
Creatinine, Ser: 2.33 mg/dL — ABNORMAL HIGH (ref 0.50–1.10)
GFR calc Af Amer: 24 mL/min — ABNORMAL LOW (ref >90)
GFR calc non Af Amer: 21 mL/min — ABNORMAL LOW (ref >90)
GLUCOSE: 131 mg/dL — AB (ref 70–99)
POTASSIUM: 4.5 meq/L (ref 3.7–5.3)
SODIUM: 139 meq/L (ref 137–147)

## 2013-12-29 LAB — URINE CULTURE
Colony Count: NO GROWTH
Culture: NO GROWTH

## 2013-12-29 LAB — MAGNESIUM: Magnesium: 2 mg/dL (ref 1.5–2.5)

## 2013-12-29 LAB — HEPARIN LEVEL (UNFRACTIONATED)
HEPARIN UNFRACTIONATED: 0.79 [IU]/mL — AB (ref 0.30–0.70)
Heparin Unfractionated: 0.81 IU/mL — ABNORMAL HIGH (ref 0.30–0.70)

## 2013-12-29 MED ORDER — CEFAZOLIN SODIUM 1-5 GM-% IV SOLN
1.0000 g | Freq: Once | INTRAVENOUS | Status: AC
  Administered 2013-12-29: 1 g via INTRAVENOUS
  Filled 2013-12-29: qty 50

## 2013-12-29 MED ORDER — SODIUM CHLORIDE 0.9 % IV BOLUS (SEPSIS)
500.0000 mL | Freq: Once | INTRAVENOUS | Status: AC
  Administered 2013-12-29: 500 mL via INTRAVENOUS

## 2013-12-29 MED ORDER — CHLORHEXIDINE GLUCONATE 4 % EX LIQD
1.0000 "application " | Freq: Once | CUTANEOUS | Status: AC
  Administered 2013-12-29: 1 via TOPICAL
  Filled 2013-12-29: qty 15

## 2013-12-29 NOTE — Progress Notes (Signed)
ANTICOAGULATION CONSULT NOTE   Pharmacy Consult for Heparin Indication: h/o DVT/PE/Afib  Allergies  Allergen Reactions  . Codeine Other (See Comments)    Pass out    . Ertapenem     From  Med list from [facility]   . Fish-Derived Products     From med list from [facility]  . Peanut Butter Flavor Hives and Other (See Comments)    Lockjaw   . Penicillins Swelling    Patient Measurements: Height: 5\' 2"  (157.5 cm) Weight: 251 lb 1.7 oz (113.9 kg) IBW/kg (Calculated) : 50.1 Heparin Dosing Weight: 80 kg  Vital Signs: Temp: 98.4 F (36.9 C) (06/17 2336) Temp src: Oral (06/17 2336) BP: 113/66 mmHg (06/18 0300) Pulse Rate: 64 (06/18 0300)  Labs:  Recent Labs  01/08/2014 2246 12/28/13 0227 12/28/13 0855 12/29/13 0242  HGB 7.1* 7.2*  --  7.3*  HCT 22.2* 22.2*  --  22.1*  PLT 167 170  --  177  LABPROT 14.1  --   --   --   INR 1.11  --   --   --   HEPARINUNFRC  --   --  0.66 0.81*  CREATININE 2.22* 2.17*  --   --     Estimated Creatinine Clearance: 30.4 ml/min (by C-G formula based on Cr of 2.17).  Assessment: 68 yo female with h/o DVT/PE and Afib, subtherapeutic INR and Coumadin on hold, for heparin   Goal of Therapy:  Heparin level 0.3-0.7 units/ml Monitor platelets by anticoagulation protocol: Yes   Plan:  Decrease Heparin 1000 units/hr Check heparin level in 8 hours.  Eddie Candle 12/29/2013,3:24 AM

## 2013-12-29 NOTE — Progress Notes (Signed)
PULMONARY / CRITICAL CARE MEDICINE  Name: Sarah Tran MRN: [id number] DOB: 1946/03/29    ADMISSION DATE:  12/21/2013 CONSULTATION DATE:  12/19/2013  REFERRING MD :  EDP PRIMARY SERVICE: PCCM  CHIEF COMPLAINT:  Respiratory Distress  BRIEF PATIENT DESCRIPTION: 68 yo with [facility] including tracheostomy performed in outside facility on 4/1.  [facility] for respiratory distress. CXR suggestive of PNA.  [facility] concerns that [facility] may need emergent bedside trach / airway. PCCM was consulted as primary.  SIGNIFICANT EVENTS / STUDIES:   6/17  Laryngoscopy by ENT >>> Normal vocal cords, tracheomalacia / stenosis  LINES / TUBES: Foley 6/16 >>>  CULTURES: Blood 6/16 >>> Urine 6/16 >>> Respiratory 6/16  >>>  ANTIBIOTICS: Levaquin 6/16 >>> 6/17 Vancomycin 6/16 >>> 6/17  INTERVIEW HISTORY:  No acute issues overnight  VITAL SIGNS: Temp:  [97.6 F (36.4 C)-98.4 F (36.9 C)] 98 F (36.7 C) (06/18 1240) Pulse Rate:  [63-89] 65 (06/18 1200) Resp:  [12-25] 12 (06/18 1200) BP: (93-142)/(42-84) 102/53 mmHg (06/18 1200) SpO2:  [99 %-100 %] 100 % (06/18 1400) FiO2 (%):  [40 %] 40 % (06/18 0400) Weight:  [113.6 kg (250 lb 7.1 oz)] 113.6 kg (250 lb 7.1 oz) (06/18 0500)  HEMODYNAMICS:   VENTILATOR SETTINGS: Vent Mode:  [-] BIPAP FiO2 (%):  [40 %] 40 % Set Rate:  [15 bmp] 15 bmp PEEP:  [6 cmH20] 6 cmH20 Pressure Support:  [6 cmH20] 6 cmH20  INTAKE / OUTPUT: Intake/Output     06/17 0701 - 06/18 0700 06/18 0701 - 06/19 0700   I.V. (mL/kg) 281.6 (2.5) 80 (0.7)   IV Piggyback     Total Intake(mL/kg) 281.6 (2.5) 80 (0.7)   Urine (mL/kg/hr) 2820 (1) 1050 (1.2)   Total Output 2820 1050   Net -2538.4 -970         PHYSICAL EXAMINATION: General: Resting in no distress Neuro: Awake, alert HEENT: Mild stridor on inhalation Cardiovascular: Regular, no murmurs Lungs: CTAB Abdomen: Obese, soft, colostomy  viable Musculoskeletal: Trace edema Skin: Intact  LABS:  CBC  Recent Labs Lab 01/09/2014 2246 12/28/13 0227 12/29/13 0242  WBC 4.5 4.2 4.6  HGB 7.1* 7.2* 7.3*  HCT 22.2* 22.2* 22.1*  PLT 167 170 177   Coag's  Recent Labs Lab 12/23/2013 2246  INR 1.11   BMET  Recent Labs Lab 01/08/2014 2246 12/28/13 0227 12/29/13 0242  NA 141 139 139  K 4.1 4.7 4.5  CL 101 103 101  CO2 23 24 25   BUN 33* 32* 34*  CREATININE 2.22* 2.17* 2.33*  GLUCOSE 97 119* 131*   Electrolytes  Recent Labs Lab 12/14/2013 2246 12/28/13 0227 12/29/13 0242  CALCIUM 9.2 9.1 9.1  MG 1.6 1.6 2.0  PHOS 3.9 3.7  --    Sepsis Markers  Recent Labs Lab 01/02/2014 2246 12/28/13 0227  LATICACIDVEN 0.9  --   PROCALCITON 0.12 <0.10   ABG  Recent Labs Lab 12/14/2013 1834  PHART 7.410  PCO2ART 40.8  PO2ART 167.0*   Liver Enzymes No results found for this basename: AST, ALT, ALKPHOS, BILITOT, ALBUMIN,  in the last 168 hours  Cardiac Enzymes  Recent Labs Lab 12/23/2013 2246  PROBNP 838.6*   Glucose  Recent Labs Lab 01/07/2014 1820 01/04/2014 2000  GLUCAP 106* 100*   IMAGING:   Ct Soft Tissue Neck Wo Contrast  12/28/2013   CLINICAL DATA:  Labored breathing.  Query tracheal stenosis.  EXAM: CT NECK WITHOUT CONTRAST  TECHNIQUE: Multidetector CT imaging of the neck was performed following the standard protocol without intravenous contrast.  COMPARISON:  Chest radiograph of 12/28/2013  FINDINGS: Parapharyngeal spaces normal and symmetric. Mild polypoid prominence of the right tonsillar pillar, image 45 series 207.  Motion artifact noted. True cords appear potentially thickened with closure of the glottis during imaging. There is subglottic tracheal narrowing, with narrow transverse dimension of the subglottic trachea measuring a maximum of 1 cm on image 41 of series 209, and with tracheal cross-sectional area at this level of 1.6 cm^2 along the lumen. Thyroid goiter noted. There is calcification along  the tracheal wall.  No discrete adenopathy in the neck. No visualized upper mediastinal adenopathy. Mild biapical pleural parenchymal scarring.  Prior mastoid effusions have cleared. Prior left middle ear fluid has cleared. Prior sinusitis has cleared.  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 Lucency anteriorly in the right sphenoid wing probably represents a large arachnoid granulation.  IMPRESSION: 1. There are 2 areas of the narrowing along the air column. One of these is at the glottic level, and may simply be due to glottic closure during imaging, but the true cords do appear somewhat thickened and probably merit direct visualization. There is also subglottic narrowing of the trachea potentially due to tracheomalacia with the thyroid goiter as a potential contributing factor. 2. There is also mild polypoid prominence of the right tonsillar pillar. This could be observed (an biopsied, if warranted based on appearance) at the time of laryngoscopy. 3. Prior sinusitis and mastoid effusions have cleared. 4. Remote left cerebellar infarct. Lucency anteriorly in the right sphenoid wing is stable and probably due to a large arachnoid granulation.   Electronically Signed   By: Herbie Baltimore M.D.   On: 12/28/2013 09:34   Dg Chest Port 1 View  12/28/2013   CLINICAL DATA:  Shortness of breath  EXAM: PORTABLE CHEST - 1 VIEW  COMPARISON:  12/20/2013  FINDINGS: Cardiac shadow is within normal limits. The lungs are poorly aerated with mild left basilar atelectasis. This is increased from the prior exam. Retrocardiac density is again seen likely related to the patient's hiatal hernia. No bony abnormality is noted.  IMPRESSION: New mild left basilar atelectasis. No other focal abnormality is seen.   Electronically Signed   By: Alcide Clever M.D.   On: 12/28/2013 08:02   Dg Chest Port 1 View  12/31/2013   CLINICAL DATA:  Hypertension and diabetes. Airspace disease in the lungs.  EXAM: PORTABLE CHEST - 1  VIEW  COMPARISON:  11/12/2013; 12/10/2013  FINDINGS: The known moderate-sized hiatal hernia least partially accounts for the left retrocardiac density.  Low lung volumes are present, causing crowding of the pulmonary vasculature. Enlarged cardiopericardial silhouette. Subtly indistinct pulmonary vasculature.  IMPRESSION: 1. Cardiomegaly with pulmonary venous hypertension. 2. Retrocardiac density is likely primarily due to the known hiatal hernia in this region.   Electronically Signed   By: Herbie Baltimore M.D.   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 and difficulty clearing secretions Doubt HCAP as no fever, leukocytosis, infiltrates and PCT 0.10 P:   ENT following Lazarus Salines) Goal SpO2>92 Supplemental oxygen BiPAP PRN only Albuterol PRN only Tracheostomy in OR 6/19 Referral to academic facility for thyroidectomy / tracheal surgery   CARDIOVASCULAR A:  HTN Dyslipidemia  H/o AF, now in SR H/o DVT P:  Amlodipine, Atorvastatin, Carvedilol  RENAL A:   Acute  on chronic kidney failure Hypomagnesemia  P:   Trend BMP Lasix per home regimen  GASTROINTESTINAL A:   Nutrition GERD Colostomy status P:   Clear liquids Protonix as preadmission  HEMATOLOGIC A:  Anemia of chronic disease Chronic anticoagulation for AF / DVT, subtherapeutic on transfer P:  Trend CBC Heparin gtt Restart Coumadin when safe per ENT Aranesp Iron  INFECTIOUS A:   HCAP unlikely P:   Observe off abx  ENDOCRINE A:   Normoglycemic and steroids d/c'd Hyperthyroidism  P:   Methimazole as preadmission  NEUROLOGIC A:   No active issues P:   No intervention required  I have personally obtained history, examined patient, evaluated and interpreted laboratory and imaging results, reviewed medical records, formulated assessment / plan and placed orders.  Lonia Farber,  MD Pulmonary and Critical Care Medicine [facility] Pager: [phone]  12/29/2013, 2:38 PM

## 2013-12-29 NOTE — Progress Notes (Signed)
Spoke with on call MD for ENT service regarding tracheostomy at 0730 am. No orders regarding heparin gtt. ENT referred me to CCM. CCM, Md Vassie Loll recommends stopping heparin gtt at 0200.

## 2013-12-29 NOTE — Progress Notes (Signed)
ANTICOAGULATION CONSULT NOTE  Pharmacy Consult for heparin Indication: atrial fibrillation/DVT   Allergies  Allergen Reactions  . Codeine Other (See Comments)    Pass out    . Ertapenem     [facility]  [facility]   . Fish-Derived Products     [facility]  . Peanut Butter Flavor Hives and Other (See Comments)    Lockjaw   . Penicillins Swelling    Patient Measurements: Height: 5\' 2"  (157.5 cm) Weight: 250 lb 7.1 oz (113.6 kg) IBW/kg (Calculated) : 50.1  Vital Signs: Temp: 98 F (36.7 C) (06/18 1240) Temp src: Oral (06/18 1300) BP: 130/75 mmHg (06/18 0700) Pulse Rate: 69 (06/18 0700) Intake/Output from previous day: 06/17 0701 - 06/18 0700 In: 281.6 [I.V.:281.6] Out: 2820 [Urine:2820] Intake/Output from this shift: Total I/O In: 70 [I.V.:70] Out: 1050 [Urine:1050]   Labs:  Recent Labs  01/04/2014 2246  HGB 7.1*  HCT 22.2*  PLT 167     Recent Labs  12/26/2013 2246 12/28/13 0227 12/29/13 0242  WBC 4.5 4.2 4.6  HGB 7.1* 7.2* 7.3*  PLT 167 170 177  CREATININE 2.22* 2.17* 2.33*   Estimated Creatinine Clearance: 28.3 ml/min (by C-G formula based on Cr of 2.33). No results found for this basename: VANCOTROUGH, VANCOPEAK, VANCORANDOM, GENTTROUGH, GENTPEAK, GENTRANDOM, TOBRATROUGH, TOBRAPEAK, TOBRARND, AMIKACINPEAK, AMIKACINTROU, AMIKACIN,  in the last 72 hours    Medical History: Past Medical History  Diagnosis Date  . Atrial fibrillation     chronic Coumadin  . Anemia of chronic disease   . HTN (hypertension)   . Morbid obesity   . Diabetes mellitus, type II   . Hyperthyroidism   . Hyperlipidemia   . CKD (chronic kidney disease), stage III   . DVT (deep venous thrombosis)     RLE; LUE; chronic Coumadin  . Glaucoma, bilateral   . Heart murmur   . OSA on CPAP   . History of blood transfusion ~ 2004; 11/2013    "while in hospital"  . H/O hiatal hernia   . Arthritis     "joints" (12/20/2013)   . Gout   . Anxiety     Medications:  Prescriptions prior to admission  Medication Sig Dispense Refill  . albuterol (PROVENTIL HFA;VENTOLIN HFA) 108 (90 BASE) MCG/ACT inhaler Inhale 2 puffs into the lungs every 4 (four) hours as needed for wheezing or shortness of breath.  1 Inhaler  1  . amLODipine (NORVASC) 10 MG tablet Take 1 tablet (10 mg total) by mouth daily.  30 tablet  1  . atorvastatin (LIPITOR) 20 MG tablet Take 20 mg by mouth daily at 8 pm.      . brimonidine-timolol (COMBIGAN) 0.2-0.5 % ophthalmic solution Place 1 drop into both eyes every 12 (twelve) hours.      . carvedilol (COREG) 25 MG tablet Take 1 tablet (25 mg total) by mouth 2 (two) times daily with a meal.  60 tablet  1  . clonazePAM (KLONOPIN) 0.5 MG tablet Take 1 tablet (0.5 mg total) by mouth 2 (two) times daily.  60 tablet  0  . darbepoetin (ARANESP) 40 MCG/0.4ML SOLN injection Inject 40 mcg into the skin every 30 (thirty) days. Given in dialysis around the 1st of the month      . ferrous sulfate 325 (65 FE) MG tablet Take 325 mg by mouth daily with breakfast.      . furosemide (LASIX) 40 MG tablet Take 1 tablet (40 mg  total) by mouth daily.  60 tablet  1  . methimazole (TAPAZOLE) 5 MG tablet Take 1 tablet (5 mg total) by mouth daily.  30 tablet  1  . nitroGLYCERIN (NITRODUR - DOSED IN MG/24 HR) 0.2 mg/hr patch Place 1 patch (0.2 mg total) onto the skin daily.  30 patch  12  . pantoprazole (PROTONIX) 40 MG tablet Take 1 tablet (40 mg total) by mouth daily at 6 (six) AM.  30 tablet  1  . potassium chloride SA (K-DUR,KLOR-CON) 20 MEQ tablet Take 2 tablets (40 mEq total) by mouth daily.  30 tablet  1  . sodium bicarbonate 650 MG tablet Take 2 tablets (1,300 mg total) by mouth 2 (two) times daily.  60 tablet  1  . vitamin C (ASCORBIC ACID) 500 MG tablet Take 500 mg by mouth daily.      . Vitamin D, Ergocalciferol, (DRISDOL) 50000 UNITS CAPS capsule Take 50,000 Units by mouth every 7 (seven) days. monday      . warfarin  (COUMADIN) 5 MG tablet Take 2.5 mg by mouth daily.      Marland Kitchen zinc sulfate 220 MG capsule Take 1 capsule (220 mg total) by mouth daily.  30 capsule  1   Assessment: 68 y.o female with recent prolonged hospitalization at [facility] including trach. On chronic anticoagulation for h/o DVT and pAFib.  Pt received warfarin x1 dose on 6/16 and was subsequently held due to possible need to go to the OR. Pt remains on heparin gtt at 900 units/hr.   Heparin level slightly supratherapeutic this am, no signs or symptoms of bleeding.    Goal of Therapy:  Heparin level: 0.3-0.7 Monitor platelets while on anticoagulation: Yes   Plan:  Decrease heparin gtt to 900 units/hr Daily HL, CBC F/u resumption of warfarin Monitor for s/sx bleeding    Agapito Games, PharmD, BCPS Clinical Pharmacist Pager: [phone] 12/29/2013 1:54 PM

## 2013-12-30 ENCOUNTER — Encounter (HOSPITAL_COMMUNITY): Payer: Medicare Other | Admitting: Anesthesiology

## 2013-12-30 ENCOUNTER — Encounter (HOSPITAL_COMMUNITY): Payer: Self-pay | Admitting: Anesthesiology

## 2013-12-30 ENCOUNTER — Inpatient Hospital Stay (HOSPITAL_COMMUNITY): Payer: Medicare Other | Admitting: Anesthesiology

## 2013-12-30 ENCOUNTER — Encounter (HOSPITAL_COMMUNITY): Admission: EM | Disposition: E | Payer: Self-pay | Source: Other Acute Inpatient Hospital | Attending: Internal Medicine

## 2013-12-30 HISTORY — PX: LARYNGOSCOPY AND BRONCHOSCOPY: SHX5659

## 2013-12-30 HISTORY — PX: TRACHEOSTOMY TUBE PLACEMENT: SHX814

## 2013-12-30 LAB — CBC
HCT: 23.7 % — ABNORMAL LOW (ref 36.0–46.0)
Hemoglobin: 7.9 g/dL — ABNORMAL LOW (ref 12.0–15.0)
MCH: 29.7 pg (ref 26.0–34.0)
MCHC: 33.3 g/dL (ref 30.0–36.0)
MCV: 89.1 fL (ref 78.0–100.0)
PLATELETS: 192 10*3/uL (ref 150–400)
RBC: 2.66 MIL/uL — AB (ref 3.87–5.11)
RDW: 15.4 % (ref 11.5–15.5)
WBC: 4.1 10*3/uL (ref 4.0–10.5)

## 2013-12-30 LAB — HEPARIN LEVEL (UNFRACTIONATED): HEPARIN UNFRACTIONATED: 0.21 [IU]/mL — AB (ref 0.30–0.70)

## 2013-12-30 LAB — GLUCOSE, CAPILLARY: GLUCOSE-CAPILLARY: 111 mg/dL — AB (ref 70–99)

## 2013-12-30 LAB — BASIC METABOLIC PANEL
BUN: 30 mg/dL — ABNORMAL HIGH (ref 6–23)
CO2: 25 meq/L (ref 19–32)
CREATININE: 2.16 mg/dL — AB (ref 0.50–1.10)
Calcium: 9.2 mg/dL (ref 8.4–10.5)
Chloride: 96 mEq/L (ref 96–112)
GFR calc non Af Amer: 23 mL/min — ABNORMAL LOW (ref >90)
GFR, EST AFRICAN AMERICAN: 26 mL/min — AB (ref >90)
Glucose, Bld: 105 mg/dL — ABNORMAL HIGH (ref 70–99)
POTASSIUM: 3.8 meq/L (ref 3.7–5.3)
Sodium: 134 mEq/L — ABNORMAL LOW (ref 137–147)

## 2013-12-30 SURGERY — CREATION, TRACHEOSTOMY
Anesthesia: Monitor Anesthesia Care

## 2013-12-30 MED ORDER — GLYCOPYRROLATE 0.2 MG/ML IJ SOLN
INTRAMUSCULAR | Status: AC
  Filled 2013-12-30: qty 2

## 2013-12-30 MED ORDER — ONDANSETRON HCL 4 MG/2ML IJ SOLN
4.0000 mg | Freq: Once | INTRAMUSCULAR | Status: AC | PRN
  Administered 2013-12-30: 4 mg via INTRAVENOUS

## 2013-12-30 MED ORDER — LIDOCAINE HCL (CARDIAC) 20 MG/ML IV SOLN
INTRAVENOUS | Status: AC
  Filled 2013-12-30: qty 5

## 2013-12-30 MED ORDER — HEPARIN SODIUM (PORCINE) 5000 UNIT/ML IJ SOLN
5000.0000 [IU] | Freq: Three times a day (TID) | INTRAMUSCULAR | Status: DC
  Filled 2013-12-30 (×3): qty 1

## 2013-12-30 MED ORDER — CLONAZEPAM 0.5 MG PO TABS: 0.5000 mg | ORAL_TABLET | Freq: Two times a day (BID) | ORAL | Status: DC

## 2013-12-30 MED ORDER — FENTANYL CITRATE 0.05 MG/ML IJ SOLN
INTRAMUSCULAR | Status: AC
  Filled 2013-12-30: qty 2

## 2013-12-30 MED ORDER — SODIUM BICARBONATE 650 MG PO TABS
1300.0000 mg | ORAL_TABLET | Freq: Two times a day (BID) | ORAL | Status: DC
  Administered 2014-01-02 – 2014-01-07 (×11): 1300 mg via ORAL
  Filled 2013-12-30 (×20): qty 2

## 2013-12-30 MED ORDER — ROCURONIUM BROMIDE 100 MG/10ML IV SOLN
INTRAVENOUS | Status: DC | PRN
  Administered 2013-12-30: 20 mg via INTRAVENOUS

## 2013-12-30 MED ORDER — WARFARIN SODIUM 2.5 MG PO TABS
2.5000 mg | ORAL_TABLET | Freq: Every day | ORAL | Status: DC
  Filled 2013-12-30: qty 1

## 2013-12-30 MED ORDER — SUCCINYLCHOLINE CHLORIDE 20 MG/ML IJ SOLN
INTRAMUSCULAR | Status: AC
  Filled 2013-12-30: qty 1

## 2013-12-30 MED ORDER — FENTANYL CITRATE 0.05 MG/ML IJ SOLN
INTRAMUSCULAR | Status: AC
  Filled 2013-12-30: qty 5

## 2013-12-30 MED ORDER — ONDANSETRON HCL 4 MG/2ML IJ SOLN
INTRAMUSCULAR | Status: AC
  Filled 2013-12-30: qty 2

## 2013-12-30 MED ORDER — FENTANYL CITRATE 0.05 MG/ML IJ SOLN
100.0000 ug | INTRAMUSCULAR | Status: DC | PRN
  Administered 2013-12-30 – 2014-01-05 (×28): 100 ug via INTRAVENOUS
  Filled 2013-12-30 (×28): qty 2

## 2013-12-30 MED ORDER — EPINEPHRINE HCL (NASAL) 0.1 % NA SOLN
NASAL | Status: AC
  Filled 2013-12-30: qty 30

## 2013-12-30 MED ORDER — ATORVASTATIN CALCIUM 20 MG PO TABS: 20.0000 mg | ORAL_TABLET | Freq: Every day | ORAL | Status: DC

## 2013-12-30 MED ORDER — ROCURONIUM BROMIDE 50 MG/5ML IV SOLN
INTRAVENOUS | Status: AC
  Filled 2013-12-30: qty 1

## 2013-12-30 MED ORDER — PROPOFOL 10 MG/ML IV BOLUS
INTRAVENOUS | Status: DC | PRN
  Administered 2013-12-30: 20 mg via INTRAVENOUS
  Administered 2013-12-30: 10 mg via INTRAVENOUS
  Administered 2013-12-30: 20 mg via INTRAVENOUS
  Administered 2013-12-30: 50 mg via INTRAVENOUS
  Administered 2013-12-30: 200 mg via INTRAVENOUS
  Administered 2013-12-30: 40 mg via INTRAVENOUS

## 2013-12-30 MED ORDER — ARTIFICIAL TEARS OP OINT
TOPICAL_OINTMENT | OPHTHALMIC | Status: AC
  Filled 2013-12-30: qty 3.5

## 2013-12-30 MED ORDER — DARBEPOETIN ALFA-POLYSORBATE 40 MCG/0.4ML IJ SOLN: 40.0000 ug | INTRAMUSCULAR | Status: DC

## 2013-12-30 MED ORDER — FENTANYL CITRATE 0.05 MG/ML IJ SOLN
INTRAMUSCULAR | Status: DC | PRN
  Administered 2013-12-30: 75 ug via INTRAVENOUS
  Administered 2013-12-30 (×3): 25 ug via INTRAVENOUS

## 2013-12-30 MED ORDER — NEOSTIGMINE METHYLSULFATE 10 MG/10ML IV SOLN
INTRAVENOUS | Status: AC
  Filled 2013-12-30: qty 1

## 2013-12-30 MED ORDER — SODIUM CHLORIDE 0.9 % IJ SOLN
INTRAMUSCULAR | Status: AC
  Filled 2013-12-30: qty 10

## 2013-12-30 MED ORDER — FERROUS SULFATE 325 (65 FE) MG PO TABS: 325.0000 mg | ORAL_TABLET | Freq: Every day | ORAL | Status: DC

## 2013-12-30 MED ORDER — GLYCOPYRROLATE 0.2 MG/ML IJ SOLN
INTRAMUSCULAR | Status: DC | PRN
  Administered 2013-12-30: 0.4 mg via INTRAVENOUS

## 2013-12-30 MED ORDER — EPHEDRINE SULFATE 50 MG/ML IJ SOLN
INTRAMUSCULAR | Status: AC
  Filled 2013-12-30: qty 1

## 2013-12-30 MED ORDER — NITROGLYCERIN 0.2 MG/HR TD PT24
0.2000 mg | MEDICATED_PATCH | Freq: Every day | TRANSDERMAL | Status: DC
  Administered 2013-12-31 – 2014-01-05 (×6): 0.2 mg via TRANSDERMAL
  Filled 2013-12-30 (×8): qty 1

## 2013-12-30 MED ORDER — HYDROMORPHONE HCL PF 1 MG/ML IJ SOLN: 0.2500 mg | INTRAMUSCULAR | Status: DC | PRN

## 2013-12-30 MED ORDER — PHENYLEPHRINE 40 MCG/ML (10ML) SYRINGE FOR IV PUSH (FOR BLOOD PRESSURE SUPPORT)
PREFILLED_SYRINGE | INTRAVENOUS | Status: AC
  Filled 2013-12-30: qty 20

## 2013-12-30 MED ORDER — LIDOCAINE HCL 4 % MT SOLN
OROMUCOSAL | Status: DC | PRN
  Administered 2013-12-30: 4 mL via TOPICAL

## 2013-12-30 MED ORDER — POTASSIUM CHLORIDE CRYS ER 20 MEQ PO TBCR: 40.0000 meq | EXTENDED_RELEASE_TABLET | Freq: Every day | ORAL | Status: DC

## 2013-12-30 MED ORDER — PANTOPRAZOLE SODIUM 40 MG PO TBEC: 40.0000 mg | DELAYED_RELEASE_TABLET | Freq: Every day | ORAL | Status: DC

## 2013-12-30 MED ORDER — ALBUTEROL SULFATE HFA 108 (90 BASE) MCG/ACT IN AERS: 2.0000 | INHALATION_SPRAY | RESPIRATORY_TRACT | Status: DC | PRN

## 2013-12-30 MED ORDER — SODIUM CHLORIDE 0.45 % IV SOLN: INTRAVENOUS | Status: DC

## 2013-12-30 MED ORDER — ALBUTEROL SULFATE (2.5 MG/3ML) 0.083% IN NEBU
2.5000 mg | INHALATION_SOLUTION | RESPIRATORY_TRACT | Status: DC | PRN
  Administered 2014-01-05: 2.5 mg via RESPIRATORY_TRACT
  Filled 2013-12-30: qty 3

## 2013-12-30 MED ORDER — HEPARIN (PORCINE) IN NACL 100-0.45 UNIT/ML-% IJ SOLN: 900.0000 [IU]/h | INTRAMUSCULAR | Status: DC

## 2013-12-30 MED ORDER — LABETALOL HCL 5 MG/ML IV SOLN
10.0000 mg | INTRAVENOUS | Status: DC | PRN
  Administered 2013-12-30 – 2014-01-01 (×5): 10 mg via INTRAVENOUS
  Filled 2013-12-30 (×7): qty 4

## 2013-12-30 MED ORDER — OXYMETAZOLINE HCL 0.05 % NA SOLN
NASAL | Status: AC
  Filled 2013-12-30: qty 15

## 2013-12-30 MED ORDER — ZINC SULFATE 220 (50 ZN) MG PO CAPS
220.0000 mg | ORAL_CAPSULE | Freq: Every day | ORAL | Status: DC
  Administered 2014-01-03 – 2014-01-10 (×7): 220 mg via ORAL
  Filled 2013-12-30 (×12): qty 1

## 2013-12-30 MED ORDER — LIDOCAINE-EPINEPHRINE 1 %-1:100000 IJ SOLN
INTRAMUSCULAR | Status: DC | PRN
  Administered 2013-12-30: 10 mL via INTRADERMAL

## 2013-12-30 MED ORDER — SUCCINYLCHOLINE CHLORIDE 20 MG/ML IJ SOLN
INTRAMUSCULAR | Status: DC | PRN
  Administered 2013-12-30: 140 mg via INTRAVENOUS
  Administered 2013-12-30: 60 mg via INTRAVENOUS

## 2013-12-30 MED ORDER — EPHEDRINE SULFATE 50 MG/ML IJ SOLN
INTRAMUSCULAR | Status: DC | PRN
  Administered 2013-12-30 (×2): 5 mg via INTRAVENOUS
  Administered 2013-12-30: 10 mg via INTRAVENOUS
  Administered 2013-12-30: 5 mg via INTRAVENOUS

## 2013-12-30 MED ORDER — LACTATED RINGERS IV SOLN
INTRAVENOUS | Status: DC | PRN
  Administered 2013-12-30 (×2): via INTRAVENOUS

## 2013-12-30 MED ORDER — ONDANSETRON HCL 4 MG/2ML IJ SOLN
INTRAMUSCULAR | Status: DC | PRN
  Administered 2013-12-30: 4 mg via INTRAVENOUS

## 2013-12-30 MED ORDER — HEPARIN (PORCINE) IN NACL 100-0.45 UNIT/ML-% IJ SOLN
800.0000 [IU]/h | INTRAMUSCULAR | Status: DC
  Administered 2013-12-31: 900 [IU]/h via INTRAVENOUS
  Administered 2013-12-31: 1100 [IU]/h via INTRAVENOUS
  Administered 2014-01-01: 1200 [IU]/h via INTRAVENOUS
  Administered 2014-01-04: 1250 [IU]/h via INTRAVENOUS
  Administered 2014-01-05: 1350 [IU]/h via INTRAVENOUS
  Administered 2014-01-05: 1250 [IU]/h via INTRAVENOUS
  Administered 2014-01-06: 1350 [IU]/h via INTRAVENOUS
  Administered 2014-01-07: 1000 [IU]/h via INTRAVENOUS
  Filled 2013-12-30 (×14): qty 250

## 2013-12-30 MED ORDER — TRIAMCINOLONE ACETONIDE 40 MG/ML IJ SUSP
INTRAMUSCULAR | Status: AC
  Filled 2013-12-30: qty 5

## 2013-12-30 MED ORDER — WARFARIN - PHYSICIAN DOSING INPATIENT: Freq: Every day | Status: DC

## 2013-12-30 MED ORDER — PHENYLEPHRINE HCL 10 MG/ML IJ SOLN
INTRAMUSCULAR | Status: DC | PRN
  Administered 2013-12-30: 40 ug via INTRAVENOUS
  Administered 2013-12-30: 120 ug via INTRAVENOUS
  Administered 2013-12-30: 160 ug via INTRAVENOUS
  Administered 2013-12-30 (×2): 120 ug via INTRAVENOUS

## 2013-12-30 MED ORDER — 0.9 % SODIUM CHLORIDE (POUR BTL) OPTIME
TOPICAL | Status: DC | PRN
  Administered 2013-12-30: 1000 mL

## 2013-12-30 MED ORDER — METHIMAZOLE 5 MG PO TABS
5.0000 mg | ORAL_TABLET | Freq: Every day | ORAL | Status: DC
  Filled 2013-12-30: qty 1

## 2013-12-30 MED ORDER — LIDOCAINE-EPINEPHRINE 1 %-1:100000 IJ SOLN
INTRAMUSCULAR | Status: AC
  Filled 2013-12-30: qty 1

## 2013-12-30 MED ORDER — NEOSTIGMINE METHYLSULFATE 10 MG/10ML IV SOLN
INTRAVENOUS | Status: DC | PRN
  Administered 2013-12-30: 3 mg via INTRAVENOUS

## 2013-12-30 MED ORDER — PROPOFOL 10 MG/ML IV BOLUS
INTRAVENOUS | Status: AC
  Filled 2013-12-30: qty 20

## 2013-12-30 MED ORDER — PANTOPRAZOLE SODIUM 40 MG IV SOLR
40.0000 mg | Freq: Every day | INTRAVENOUS | Status: DC
  Administered 2013-12-30 – 2014-01-07 (×9): 40 mg via INTRAVENOUS
  Filled 2013-12-30 (×12): qty 40

## 2013-12-30 MED ORDER — FUROSEMIDE 40 MG PO TABS
40.0000 mg | ORAL_TABLET | Freq: Every day | ORAL | Status: DC
  Filled 2013-12-30 (×3): qty 1

## 2013-12-30 MED ORDER — MIDAZOLAM HCL 2 MG/2ML IJ SOLN
INTRAMUSCULAR | Status: AC
  Filled 2013-12-30: qty 2

## 2013-12-30 MED ORDER — LIDOCAINE HCL (CARDIAC) 20 MG/ML IV SOLN
INTRAVENOUS | Status: DC | PRN
  Administered 2013-12-30: 100 mg via INTRAVENOUS

## 2013-12-30 SURGICAL SUPPLY — 44 items
APPLIER CLIP 9.375 MED OPEN (MISCELLANEOUS) ×3
BLADE 10 SAFETY STRL DISP (BLADE) ×3 IMPLANT
BLADE SURG ROTATE 9660 (MISCELLANEOUS) IMPLANT
CANISTER SUCTION 2500CC (MISCELLANEOUS) ×3 IMPLANT
CLEANER TIP ELECTROSURG 2X2 (MISCELLANEOUS) ×3 IMPLANT
CLIP APPLIE 9.375 MED OPEN (MISCELLANEOUS) ×1 IMPLANT
CLIP TI WIDE RED SMALL 24 (CLIP) ×3 IMPLANT
CONT SPEC 4OZ CLIKSEAL STRL BL (MISCELLANEOUS) ×6 IMPLANT
COVER SURGICAL LIGHT HANDLE (MISCELLANEOUS) ×3 IMPLANT
CRADLE DONUT ADULT HEAD (MISCELLANEOUS) IMPLANT
DECANTER SPIKE VIAL GLASS SM (MISCELLANEOUS) ×3 IMPLANT
ELECT COATED BLADE 2.86 ST (ELECTRODE) ×6 IMPLANT
ELECT REM PT RETURN 9FT ADLT (ELECTROSURGICAL) ×3
ELECTRODE REM PT RTRN 9FT ADLT (ELECTROSURGICAL) ×1 IMPLANT
GLOVE BIO SURGEON STRL SZ 6.5 (GLOVE) ×8 IMPLANT
GLOVE BIO SURGEONS STRL SZ 6.5 (GLOVE) ×4
GLOVE BIOGEL PI IND STRL 6.5 (GLOVE) ×1 IMPLANT
GLOVE BIOGEL PI IND STRL 7.0 (GLOVE) ×1 IMPLANT
GLOVE BIOGEL PI INDICATOR 6.5 (GLOVE) ×2
GLOVE BIOGEL PI INDICATOR 7.0 (GLOVE) ×2
GLOVE ECLIPSE 8.0 STRL XLNG CF (GLOVE) ×6 IMPLANT
GOWN STRL REUS W/ TWL LRG LVL3 (GOWN DISPOSABLE) ×3 IMPLANT
GOWN STRL REUS W/ TWL XL LVL3 (GOWN DISPOSABLE) ×1 IMPLANT
GOWN STRL REUS W/TWL LRG LVL3 (GOWN DISPOSABLE) ×6
GOWN STRL REUS W/TWL XL LVL3 (GOWN DISPOSABLE) ×2
KIT BASIN OR (CUSTOM PROCEDURE TRAY) ×3 IMPLANT
KIT ROOM TURNOVER OR (KITS) ×3 IMPLANT
NS IRRIG 1000ML POUR BTL (IV SOLUTION) ×3 IMPLANT
PAD ARMBOARD 7.5X6 YLW CONV (MISCELLANEOUS) ×6 IMPLANT
PENCIL BUTTON HOLSTER BLD 10FT (ELECTRODE) ×3 IMPLANT
SOLUTION ANTI FOG 6CC (MISCELLANEOUS) ×3 IMPLANT
SPONGE DRAIN TRACH 4X4 STRL 2S (GAUZE/BANDAGES/DRESSINGS) ×3 IMPLANT
STAPLER VISISTAT 35W (STAPLE) ×3 IMPLANT
SUT CHROMIC 2 0 SH (SUTURE) ×3 IMPLANT
SUT ETHILON 2 0 FS 18 (SUTURE) IMPLANT
SUT ETHILON 4 0 PS 2 18 (SUTURE) ×3 IMPLANT
SUT SILK 2 0 SH CR/8 (SUTURE) ×3 IMPLANT
TOWEL OR 17X24 6PK STRL BLUE (TOWEL DISPOSABLE) ×3 IMPLANT
TOWEL OR 17X26 10 PK STRL BLUE (TOWEL DISPOSABLE) ×3 IMPLANT
TRAY ENT MC OR (CUSTOM PROCEDURE TRAY) ×3 IMPLANT
TUBE CONNECTING 12'X1/4 (SUCTIONS) ×1
TUBE CONNECTING 12X1/4 (SUCTIONS) ×2 IMPLANT
TUBE TRACH SHILEY  6 DIST  CUF (TUBING) ×3 IMPLANT
WATER STERILE IRR 1000ML POUR (IV SOLUTION) ×3 IMPLANT

## 2013-12-30 NOTE — Progress Notes (Signed)
PULMONARY / CRITICAL CARE MEDICINE  Name: Sarah Tran MRN: [id number] DOB: [birth year]    ADMISSION DATE:  12/26/2013 CONSULTATION DATE:  12/23/2013  REFERRING MD :  EDP PRIMARY SERVICE: PCCM  CHIEF COMPLAINT:  Respiratory Distress  BRIEF PATIENT DESCRIPTION: 68 yo with recent prolonged hospitalization at [facility] including tracheostomy performed in outside facility on 4/1.  Went to [facility] 6/16 for respiratory distress. CXR suggestive of PNA.  Transferred to [facility] concerns that she may need emergent bedside trach / airway. PCCM was consulted as primary.  SIGNIFICANT EVENTS / STUDIES:   6/17  Laryngoscopy by ENT >>> Normal vocal cords, tracheomalacia / stenosis 6/19  Tracheostomy by ENT  LINES / TUBES: Foley 6/16 >>> Trach (ENT) 6/19 >>>  CULTURES: Blood 6/16 >>> Urine 6/16 >>> neg  ANTIBIOTICS: Levaquin 6/16 >>> 6/17 Vancomycin 6/16 >>> 6/17  INTERVIEW HISTORY:  Tracheostomy in OR by ENT  VITAL SIGNS: Temp:  [97.4 F (36.3 C)-98.3 F (36.8 C)] 97.4 F (36.3 C) (06/19 1033) Pulse Rate:  [53-83] 58 (06/19 1100) Resp:  [4-24] 4 (06/19 1100) BP: (77-158)/(36-75) 141/68 mmHg (06/19 1100) SpO2:  [100 %] 100 % (06/19 1100) FiO2 (%):  [40 %] 40 % (06/18 2341) Weight:  [112.7 kg (248 lb 7.3 oz)] 112.7 kg (248 lb 7.3 oz) (06/19 0500)  HEMODYNAMICS:   VENTILATOR SETTINGS: Vent Mode:  [-] BIPAP FiO2 (%):  [40 %] 40 % Set Rate:  [15 bmp] 15 bmp PEEP:  [6 cmH20] 6 cmH20 Pressure Support:  [6 cmH20] 6 cmH20  INTAKE / OUTPUT: Intake/Output     06/18 0701 - 06/19 0700 06/19 0701 - 06/20 0700   I.V. (mL/kg) 189.1 (1.7) 1000 (8.9)   IV Piggyback 50    Total Intake(mL/kg) 239.1 (2.1) 1000 (8.9)   Urine (mL/kg/hr) 3895 (1.4) 1500 (3)   Total Output 3895 1500   Net -3655.9 -500         PHYSICAL EXAMINATION: General: Resting in no distress Neuro: Awake, alert HEENT: Minor oozing form tracheostomy site Cardiovascular: Regular, no murmurs Lungs: CTAB Abdomen:  Obese, soft, colostomy viable Musculoskeletal: Trace edema Skin: Intact  LABS:  CBC  Recent Labs Lab 12/28/13 0227 12/29/13 0242 01/05/2014 0353  WBC 4.2 4.6 4.1  HGB 7.2* 7.3* 7.9*  HCT 22.2* 22.1* 23.7*  PLT 170 177 192   Coag's  Recent Labs Lab 01/09/2014 2246  INR 1.11   BMET  Recent Labs Lab 12/28/13 0227 12/29/13 0242 12/26/2013 0353  NA 139 139 134*  K 4.7 4.5 3.8  CL 103 101 96  CO2 24 25 25   BUN 32* 34* 30*  CREATININE 2.17* 2.33* 2.16*  GLUCOSE 119* 131* 105*   Electrolytes  Recent Labs Lab 12/21/2013 2246 12/28/13 0227 12/29/13 0242 12/22/2013 0353  CALCIUM 9.2 9.1 9.1 9.2  MG 1.6 1.6 2.0  --   PHOS 3.9 3.7  --   --    Sepsis Markers  Recent Labs Lab 01/10/2014 2246 12/28/13 0227  LATICACIDVEN 0.9  --   PROCALCITON 0.12 <0.10   ABG  Recent Labs Lab 12/24/2013 1834  PHART 7.410  PCO2ART 40.8  PO2ART 167.0*   Liver Enzymes No results found for this basename: AST, ALT, ALKPHOS, BILITOT, ALBUMIN,  in the last 168 hours  Cardiac Enzymes  Recent Labs Lab 12/22/2013 2246  PROBNP 838.6*   Glucose  Recent Labs Lab 12/14/2013 1820 12/20/2013 2000 01/09/2014 0944  GLUCAP 106* 100* 111*   IMAGING:   No results found.  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 and difficulty clearing secretions, s/p trach re do 6/19 Doubt HCAP as no fever, leukocytosis, infiltrates and PCT 0.10 P:   ENT following Sarah Salines([place]) Goal SpO2>92 Supplemental oxygen Albuterol PRN Would deflate cough / try PMV on 6/20 Needs referral to academic facility for thyroidectomy / tracheal surgery before d/c  CARDIOVASCULAR A:  HTN Dyslipidemia  H/o AF, now in SR H/o DVT P:  Goal BP<160/90 Amlodipine, Atorvastatin, Carvedilol when able Labetalol PRN  RENAL A:   Acute on chronic kidney failure Hypomagnesemia  P:   Trend BMP Lasix per home regimen when able Bicarb tabs D/c  IVF  GASTROINTESTINAL A:   Nutrition GERD Colostomy status P:   NPO SLP eval 9/20 Protonix as preadmission when able  HEMATOLOGIC A:  Anemia of chronic disease Chronic anticoagulation for AF / DVT, subtherapeutic on transfer P:  Trend CBC Coumadin / Heparin gtt when safe per ENT Heparin [place] for now Aranesp Iron  INFECTIOUS A:   HCAP unlikely P:   Observe off abx  ENDOCRINE A:   Normoglycemic and steroids d/c'd Hyperthyroidism  P:   Methimazole as preadmission when able  NEUROLOGIC A:   Pain Deconditioning P:   Fentanyl PRN PT / OT  I have personally obtained history, examined patient, evaluated and interpreted laboratory and imaging results, reviewed medical records, formulated assessment / plan and placed orders.  Lonia FarberZUBELEVITSKIY, Frisco Cordts, MD Pulmonary and Critical Care Medicine [facility] Pager: [phone]  12/13/2013, 11:23 AM

## 2013-12-30 NOTE — Transfer of Care (Signed)
Immediate Anesthesia Transfer of Care Note  Patient: Sarah Tran  Procedure(s) Performed: Procedure(s): TRACHEOSTOMY (N/A) DIRECT LARYNGOSCOPY AND BRONCHOSCOPY (N/A)  Patient Location: PACU  Anesthesia Type:General  Level of Consciousness: awake, alert  and oriented  Airway & Oxygen Therapy: Patient Spontanous Breathing and Patient connected to tracheostomy mask oxygen  Post-op Assessment: Report given to PACU RN  Post vital signs: Reviewed and stable  Complications: No apparent anesthesia complications

## 2013-12-30 NOTE — Anesthesia Postprocedure Evaluation (Signed)
  Anesthesia Post-op Note  Patient: Sarah Tran  Procedure(s) Performed: Procedure(s): TRACHEOSTOMY (N/A) DIRECT LARYNGOSCOPY AND BRONCHOSCOPY (N/A)  Patient Location: PACU  Anesthesia Type:General  Level of Consciousness: awake, oriented, sedated and patient cooperative  Airway and Oxygen Therapy: Patient Spontanous Breathing  Post-op Pain: none  Post-op Assessment: Post-op Vital signs reviewed, Patient's Cardiovascular Status Stable, Respiratory Function Stable, Patent Airway and No signs of Nausea or vomiting  Post-op Vital Signs: stable  Last Vitals:  Filed Vitals:   2014-01-06 1100  BP: 141/68  Pulse: 58  Temp:   Resp: 4    Complications: No apparent anesthesia complications

## 2013-12-30 NOTE — Progress Notes (Signed)
SLP Cancellation Note  Patient Details Name: Sarah Tran MRN: [id number] DOB: 1945/10/23   Cancelled treatment:       Reason Eval/Treat Not Completed: Patient not medically ready. Dr. Raye Sorrow order and note states pt will be ready for PMSV trials on Monday. Will assess pt on 6/22. Thank you for the order.  Harlon Ditty, MA CCC-SLP [phone]   Claudine Mouton 12/13/2013, 11:15 AM

## 2013-12-30 NOTE — Progress Notes (Signed)
ANTICOAGULATION CONSULT NOTE  Pharmacy Consult for heparin Indication: atrial fibrillation/DVT   Allergies  Allergen Reactions  . Codeine Other (See Comments)    Pass out    . Ertapenem     [facility]  [facility]   . Fish-Derived Products     [facility]  . Peanut Butter Flavor Hives and Other (See Comments)    Lockjaw   . Penicillins Swelling    Patient Measurements: Height: 5\' 2"  (157.5 cm) Weight: 248 lb 7.3 oz (112.7 kg) IBW/kg (Calculated) : 50.1  Vital Signs: Temp: 97.4 F (36.3 C) (06/19 1033) Temp src: Oral (06/19 1300) BP: 140/71 mmHg (06/19 1300) Pulse Rate: 58 (06/19 1300) Intake/Output from previous day: 06/18 0701 - 06/19 0700 In: 239.1 [I.V.:189.1; IV Piggyback:50] Out: 3895 [Urine:3895] Intake/Output from this shift: Total I/O In: 1000 [I.V.:1000] Out: 1850 [Urine:1850]   Labs:  Recent Labs  09/27/2013 2246  HGB 7.1*  HCT 22.2*  PLT 167     Recent Labs  12/28/13 0227 12/29/13 0242 12/13/2013 0353  WBC 4.2 4.6 4.1  HGB 7.2* 7.3* 7.9*  PLT 170 177 192  CREATININE 2.17* 2.33* 2.16*   Estimated Creatinine Clearance: 30.4 ml/min (by C-G formula based on Cr of 2.16). No results found for this basename: VANCOTROUGH, VANCOPEAK, VANCORANDOM, GENTTROUGH, GENTPEAK, GENTRANDOM, TOBRATROUGH, TOBRAPEAK, TOBRARND, AMIKACINPEAK, AMIKACINTROU, AMIKACIN,  in the last 72 hours    Medical History: Past Medical History  Diagnosis Date  . Atrial fibrillation     chronic Coumadin  . Anemia of chronic disease   . HTN (hypertension)   . Morbid obesity   . Diabetes mellitus, type II   . Hyperthyroidism   . Hyperlipidemia   . CKD (chronic kidney disease), stage III   . DVT (deep venous thrombosis)     RLE; LUE; chronic Coumadin  . Glaucoma, bilateral   . Heart murmur   . OSA on CPAP   . History of blood transfusion ~ 2004; 11/2013    "while in hospital"  . H/O hiatal hernia   . Arthritis     "joints" (11-04-2013)  . Gout   . Anxiety     Medications:  Prescriptions prior to admission  Medication Sig Dispense Refill  . albuterol (PROVENTIL HFA;VENTOLIN HFA) 108 (90 BASE) MCG/ACT inhaler Inhale 2 puffs into the lungs every 4 (four) hours as needed for wheezing or shortness of breath.  1 Inhaler  1  . amLODipine (NORVASC) 10 MG tablet Take 1 tablet (10 mg total) by mouth daily.  30 tablet  1  . atorvastatin (LIPITOR) 20 MG tablet Take 20 mg by mouth daily at 8 pm.      . brimonidine-timolol (COMBIGAN) 0.2-0.5 % ophthalmic solution Place 1 drop into both eyes every 12 (twelve) hours.      . carvedilol (COREG) 25 MG tablet Take 1 tablet (25 mg total) by mouth 2 (two) times daily with a meal.  60 tablet  1  . clonazePAM (KLONOPIN) 0.5 MG tablet Take 1 tablet (0.5 mg total) by mouth 2 (two) times daily.  60 tablet  0  . darbepoetin (ARANESP) 40 MCG/0.4ML SOLN injection Inject 40 mcg into the skin every 30 (thirty) days. Given in dialysis around the 1st of the month      . ferrous sulfate 325 (65 FE) MG tablet Take 325 mg by mouth daily with breakfast.      . furosemide (LASIX) 40 MG tablet Take 1 tablet (40  mg total) by mouth daily.  60 tablet  1  . methimazole (TAPAZOLE) 5 MG tablet Take 1 tablet (5 mg total) by mouth daily.  30 tablet  1  . nitroGLYCERIN (NITRODUR - DOSED IN MG/24 HR) 0.2 mg/hr patch Place 1 patch (0.2 mg total) onto the skin daily.  30 patch  12  . pantoprazole (PROTONIX) 40 MG tablet Take 1 tablet (40 mg total) by mouth daily at 6 (six) AM.  30 tablet  1  . potassium chloride SA (K-DUR,KLOR-CON) 20 MEQ tablet Take 2 tablets (40 mEq total) by mouth daily.  30 tablet  1  . sodium bicarbonate 650 MG tablet Take 2 tablets (1,300 mg total) by mouth 2 (two) times daily.  60 tablet  1  . vitamin C (ASCORBIC ACID) 500 MG tablet Take 500 mg by mouth daily.      . Vitamin D, Ergocalciferol, (DRISDOL) 50000 UNITS CAPS capsule Take 50,000 Units by mouth every 7 (seven) days.  monday      . warfarin (COUMADIN) 5 MG tablet Take 2.5 mg by mouth daily.      Marland Kitchen zinc sulfate 220 MG capsule Take 1 capsule (220 mg total) by mouth daily.  30 capsule  1   Assessment: 68 y.o female with recent prolonged hospitalization at [facility] including trach. On chronic anticoagulation for h/o DVT and pAFib.  Pt received warfarin x1 dose on 6/16 and was subsequently held due to possible need to go to the OR.   Heparin infusion was held this morning  ~ 2 AM in anticipation of trach surgery.  Now s/p trach placement.  Per Dr. Lazarus Salines and Dr. Herma Carson, okay to resume IV heparin, but per RN currently having bleeding around the trach site.  Spoke to Dr. Herma Carson, and will start heparin later today once bleeding resolves (will target 4 pm for now)  Goal of Therapy:  Heparin level: 0.3-0.7 Monitor platelets while on anticoagulation: Yes  Plan:  1. Resume IV heparin at 900 units/hr at ~ 1600 PM. 2. Check heparin level 6 hrs after gtt resumes. 3. Daily heparin level and CBC. 4. Will monitor for bleeding.  Tad Moore, BCPS  Clinical Pharmacist Pager [phone]  Dec 31, 2013 1:59 PM

## 2013-12-30 NOTE — Op Note (Signed)
12/21/2013  9:39 AM    Sydnee Cabaliggs, Klea  [id number]   Pre-Op Dx:  Tracheal stenosis with respiratory insufficiency, thyroid goiter  Post-op Dx: Same  Proc: Direct laryngoscopy, bronchoscopy, complex revision tracheostomy with partial thyroidectomy   Surg:  Flo ShanksWOLICKI, KAROL T MD  Anes:  GOT  EBL:  25 mL  Comp:  None  Findings:  By endoscopy, narrowing and some granulation tissue over a vertical dimension of 2 or 3 cm centered around the tracheostomy site. On the external skin, a tracheostomy stoma wound with granulation tissue off-center towards the right. In the neck, a bulky thyroid, both lobes. Vertical compression of the trachea over a 2-3 cm distance consistent with the internal findings with a "knifelike" configuration. Upon reopening the trachea, it was apparent that that the previous surgery was a vertical slit tracheotomy involving 2 or 3 tracheal rings.  Procedure: With the patient in a comfortable supine position, general mask anesthesia was administered.  At an appropriate level, the table was turned 90 the patient placed in a slight reverse Trendelenburg. A rubber tooth guard was applied. Using the [facility] sliding laryngoscope, the endolarynx was inspected. A 0 Hopkins rod telescope was used to inspect the upper trachea with the findings as described above. The Hopkins rod was removed.  Continuing with Jackson laryngoscope, the patient was intubated orotracheally without difficulty . Anesthesia was continued per orotracheal tube.  The Jean RosenthalJackson scope was removed in an anterior commissure laryngoscope was introduced and full direct laryngoscopy was performed with no additional findings.  The external neck was examined and palpated with the findings as described above. The proposed surgical site was infiltrated with 1% Xylocaine with 1 100,000 epinephrine, 10 mL total. Several minutes were allowed for this to take effect for intraoperative hemostasis. A sterile preparation and  draping of the lower neck and upper chest was accomplished using chlorhexidine soap.  The previous tracheostomy site was marked out for a total of 6 cm, centered around the midline. The previous scar including a small amount of central granulation was sharply resected and discarded. Working through scar tissue, the strap muscles were identified. A right anterior jugular vein was identified, and controlled with silk ligatures.  The strap muscles were dissected and retracted laterally on each side. Scar tissue from the cricoid arch down along the face of the trachea was dissected from the strap muscles and from the face of the trachea. Some mucopus was identified consistent with granulation tissue in the old tract.  Upon retracting the strap muscles laterally, the thyroid lobe was observed on each side to be quite bulky. This was dissected to a moderate degree and then crossclamped with a Kelly hemostat. A partial resection of the lobe including the isthmus on each side was performed. Hemostasis was achieved on the right side with a 2-0 silk suture ligature. The left side, the cut edge was oversewn with 2-0 silk in running locking fashion. Hemostasis was observed. The thyroid lobes were retracted laterally.  The previous tracheostomy site was identified and opened with a hemostat. A vertical incision was noted from the previous surgery involving 2 or 3 tracheal rings. The tracheal lumen was quite narrow from side to side with a knifelike or "prow configuration.    Hemostasis was observed. The tracheal opening was dilated. A previously tested #6 cuffed Shiley tracheostomy tube was brought into the field. The orotracheal tube was backed out and the tracheostomy tube placed without difficulty. The cuff was inflated and observed be intact. Ablation was assumed  per tracheostomy tube. Good CO2 return return was identified at the a tracheostomy dressing was applied in the standard fashion. Note that the wound was  minimally closed on each side with a 4-0 nylon stitch. At this point the tracheostomy tube was secured with the cotton twill ties in the standard fashion. Again hemostasis was observed adequate ventilation was noted.  The patient was returned to anesthesia, awakened, and transferred to recovery in stable condition.  Dispo:  OR to PACU back to 2100  Plan:  With adequate wound healing, we'll hopefully get her downsize to a #4 Shelbie Hutching valve for  communication. We will do a CT scan in 6 weeks to see if the partial thyroidectomy will help open the tracheal lumen.  Long term, would hope for decannulation, but may need assistance at [facility] for tracheal reconstruction.       Cephus Richer MD

## 2013-12-30 NOTE — Anesthesia Procedure Notes (Signed)
Procedure Name: Intubation Date/Time: 2014-01-13 8:00 AM Performed by: Jefm Miles E Pre-anesthesia Checklist: Patient identified, Emergency Drugs available, Suction available, Patient being monitored and Timeout performed Patient Re-evaluated:Patient Re-evaluated prior to inductionOxygen Delivery Method: Circle system utilized Preoxygenation: Pre-oxygenation with 100% oxygen Intubation Type: IV induction Ventilation: Mask ventilation without difficulty Laryngoscope Size: Mac and 3 Grade View: Grade I Tube size: 6.5 mm Number of attempts: 1 Airway Equipment and Method: Stylet Placement Confirmation: ETT inserted through vocal cords under direct vision,  positive ETCO2 and breath sounds checked- equal and bilateral Secured at: 20 cm Tube secured with: Tape Dental Injury: Teeth and Oropharynx as per pre-operative assessment  Comments: DL x 1 by Burna Sis, SRNA for LTA placement. DL by Surgeon and ETT inserted by Surgeon.

## 2013-12-30 NOTE — Anesthesia Preprocedure Evaluation (Addendum)
Anesthesia Evaluation  Patient identified by MRN, date of birth, ID band Patient awake    Reviewed: Allergy & Precautions, H&P , NPO status , Patient's Chart, lab work & pertinent test results  Airway Mallampati: II TM Distance: >3 FB Neck ROM: Full    Dental  (+) Teeth Intact, Dental Advisory Given,    Pulmonary sleep apnea ,          Cardiovascular hypertension,     Neuro/Psych    GI/Hepatic hiatal hernia,   Endo/Other  diabetes, Type 2Hyperthyroidism Morbid obesity  Renal/GU CRFRenal disease     Musculoskeletal   Abdominal   Peds  Hematology  (+) anemia ,   Anesthesia Other Findings   Reproductive/Obstetrics                          Anesthesia Physical Anesthesia Plan  ASA: IV  Anesthesia Plan: MAC and General   Post-op Pain Management:    Induction: Intravenous  Airway Management Planned:   Additional Equipment:   Intra-op Plan:   Post-operative Plan:   Informed Consent: I have reviewed the patients History and Physical, chart, labs and discussed the procedure including the risks, benefits and alternatives for the proposed anesthesia with the patient or authorized representative who has indicated his/her understanding and acceptance.     Plan Discussed with:   Anesthesia Plan Comments:         Anesthesia Quick Evaluation

## 2013-12-30 NOTE — Progress Notes (Signed)
RT and RN changed pt trach ties and gauze. No complications. Vital signs stable. RT will continue to monitor.

## 2013-12-30 NOTE — Progress Notes (Signed)
Pt tracheostomy site bleeding, pharmacy and Dr. Vassie Loll notified. Heparin to start at 1600; Instructed to hold for now. Will start Heparin Gtt once bleeding slows or subsides. Will continue to monitor patient and tracheostomy site.

## 2013-12-31 LAB — BASIC METABOLIC PANEL
BUN: 26 mg/dL — AB (ref 6–23)
CHLORIDE: 101 meq/L (ref 96–112)
CO2: 27 mEq/L (ref 19–32)
Calcium: 9.3 mg/dL (ref 8.4–10.5)
Creatinine, Ser: 1.84 mg/dL — ABNORMAL HIGH (ref 0.50–1.10)
GFR calc non Af Amer: 27 mL/min — ABNORMAL LOW (ref >90)
GFR, EST AFRICAN AMERICAN: 32 mL/min — AB (ref >90)
Glucose, Bld: 117 mg/dL — ABNORMAL HIGH (ref 70–99)
Potassium: 4 mEq/L (ref 3.7–5.3)
Sodium: 139 mEq/L (ref 137–147)

## 2013-12-31 LAB — CBC
HCT: 26.9 % — ABNORMAL LOW (ref 36.0–46.0)
HEMOGLOBIN: 8.8 g/dL — AB (ref 12.0–15.0)
MCH: 29.4 pg (ref 26.0–34.0)
MCHC: 32.7 g/dL (ref 30.0–36.0)
MCV: 90 fL (ref 78.0–100.0)
Platelets: 189 10*3/uL (ref 150–400)
RBC: 2.99 MIL/uL — ABNORMAL LOW (ref 3.87–5.11)
RDW: 15.1 % (ref 11.5–15.5)
WBC: 6.9 10*3/uL (ref 4.0–10.5)

## 2013-12-31 LAB — HEPARIN LEVEL (UNFRACTIONATED): HEPARIN UNFRACTIONATED: 0.14 [IU]/mL — AB (ref 0.30–0.70)

## 2013-12-31 NOTE — Evaluation (Addendum)
Passy-Muir Speaking Valve - Evaluation Patient Details  Name: Sarah Tran MRN: [id number] Date of Birth: 1946-04-16  Today's Date: 12/31/2013 Time: 1220-1249 SLP Time Calculation (min): 29 min  Past Medical History:  Past Medical History  Diagnosis Date  . Atrial fibrillation     chronic Coumadin  . Anemia of chronic disease   . HTN (hypertension)   . Morbid obesity   . Diabetes mellitus, type II   . Hyperthyroidism   . Hyperlipidemia   . CKD (chronic kidney disease), stage III   . DVT (deep venous thrombosis)     RLE; LUE; chronic Coumadin  . Glaucoma, bilateral   . Heart murmur   . OSA on CPAP   . History of blood transfusion ~ 2004; 11/2013    "while in hospital"  . H/O hiatal hernia   . Arthritis     "joints" (01/10/2014)  . Gout   . Anxiety    Past Surgical History:  Past Surgical History  Procedure Laterality Date  . Exploratory laparotomy  09/2013    appy, diverting colostomy for sigmoid perf following removal of ileal polyp.   . Gastrostomy tube placement  10/2013  . Tracheostomy  10/2013    [facility] Trach decannulated 11/18/2013 at [facility].  . Cholecystectomy    . Carpal tunnel release Right   . Appendectomy    . Vaginal hysterectomy      "partial"  . Cardiac catheterization    . Tracheostomy closure     HPI:  68 [facility] requiring s/p trach placement Friday 01/07/2014.  Pt for PMSV and swallow evaluation per [facility].  Pt has complex medical hx including dysphagia and pna requiring trach and peg.   Per ENT surgical note with adequate wound healing, we'll hopefully get her downsized to a #4 Passey -Muir valve for  communication. CT scan to be repeated in 6 weeks to see if the partial thyroidectomy will help open the tracheal lumen per ENT.  And ENT hopes for decannulation in long term.  Pt does not recall using PMSV or having swallow evalution in xray previously.     Assessment / Plan / Recommendation Clinical  Impression  Pt presents with decreased tolerance of PMSV - ? due to edema from trach tube placement and/or stenosis.  Pt able to phonate around the trach tube in 1-2 words - requires excessive effort and appeared to fatigue as session proceeded.  Upon placement of pmsv, within 30 seconds pt presents with significant co2 retention.  Pt denies deficits in breathing with trach tube cuff deflated.  Recommend continue PMSV trials with SLP only - hopeful for tolerance.   Recommendation to finger occlude trach with clean gloved hand for pt to verbalize in single words as indicated reviewed with pt and Deniece PortelaWayne *pt's brother.  Pt requiring moderate verbal/visual cues to time phonation with exhalation for maximal communication benefit.    Recommend pt have MBS after tolerating PMSV prior to starting po diet due to h/o dysphagia requiring PEG tube and current respiratory status.  Per RN, pt requiring suction nearly every hour.  Educated pt and brother to findings and both agreeable to plan.       SLP Assessment  Patient needs continued Speech Lanaguage Pathology Services    Follow Up Recommendations  LTACH    Frequency and Duration min 2x/week      Pertinent Vitals/Pain Afebrile, decreased    SLP Goals Potential to Achieve Goals: Fair Potential Considerations:  Ability to learn/carryover information   PMSV Trial  PMSV was placed for: communication Able to redirect subglottic air through upper airway: Yes Able to Attain Phonation: Yes Voice Quality: Low vocal intensity;Breathy Able to Expectorate Secretions: Yes (minimal secretions) Level of Secretion Expectoration with PMSV: Tracheal Breath Support for Phonation: Mildly decreased Intelligibility: Intelligible Respirations During Trial: 20 SpO2 During Trial: 100 % Pulse During Trial: 81   Tracheostomy Tube    #6 Shiley cuffed    Vent Dependency  FiO2 (%): 28 %    Cuff Deflation Trial Tolerated Cuff Deflation: Yes Length of Time for  Cuff Deflation Trial: 20 minutes Behavior: Alert;Good eye contact;Smiling;Cooperative;Expresses self well Cuff Deflation Trial - Comments: pt able to phonate around trach tube   Mills Koller, MS [facility] SLP [phone]

## 2013-12-31 NOTE — Progress Notes (Signed)
Tracheostomy stable, no bleeding. Breathing without difficulty. Continued care.

## 2013-12-31 NOTE — Progress Notes (Signed)
PULMONARY / CRITICAL CARE MEDICINE  Name: Sydnee Cabalancy Oquinn MRN: [id number] DOB: 12-29-1945    ADMISSION DATE:  12/21/2013 CONSULTATION DATE:  12/13/2013   REFERRING MD :  EDP PRIMARY SERVICE: PCCM  CHIEF COMPLAINT:  Respiratory Distress  BRIEF PATIENT DESCRIPTION: 68 yo with recent prolonged hospitalization including tracheostomy, ultimately decannulated and d/c 6/1. Returned to OSH 6/16 for respiratory distress. CXR suggestive of PNA.  [facility] concerns that she may need emergent bedside trach / airway. PCCM was consulted as primary.  SIGNIFICANT EVENTS / STUDIES:   6/17  Laryngoscopy by ENT >>> Normal vocal cords, tracheomalacia / stenosis 6/19  Tracheostomy by ENT  LINES / TUBES: Foley 6/16 >>> Trach (ENT) 6/19 >>>  CULTURES: Blood 6/16 >>> Urine 6/16 >>> neg  ANTIBIOTICS: Levaquin 6/16 >>> 6/17 Vancomycin 6/16 >>> 6/17  INTERVIEW HISTORY:  Trach yesterday by ENT. Some bleeding overnight, heparin gtt held, improved.   VITAL SIGNS: Temp:  [97.4 F (36.3 C)-98.3 F (36.8 C)] 98.1 F (36.7 C) (06/20 0734) Pulse Rate:  [50-83] 75 (06/20 0800) Resp:  [0-26] 25 (06/20 0800) BP: (128-173)/(59-85) 138/80 mmHg (06/20 0700) SpO2:  [97 %-100 %] 97 % (06/20 0800) FiO2 (%):  [28 %-40 %] 28 % (06/20 0800) Weight:  [245 lb 2.4 oz (111.2 kg)] 245 lb 2.4 oz (111.2 kg) (06/20 0418)  HEMODYNAMICS:   VENTILATOR SETTINGS: Vent Mode:  [-]  FiO2 (%):  [28 %-40 %] 28 %  INTAKE / OUTPUT: Intake/Output     06/19 0701 - 06/20 0700 06/20 0701 - 06/21 0700   I.V. (mL/kg) 1000 (9)    IV Piggyback     Total Intake(mL/kg) 1000 (9)    Urine (mL/kg/hr) 4550 (1.7)    Total Output 4550     Net -3550           PHYSICAL EXAMINATION: General: Resting in no distress Neuro: Awake, alert, communicates  HEENT: mm moist, no JVD, no bleeding at trach site noted  Cardiovascular: Regular, no murmurs Lungs: resps even non labored on ATC, CTAB Abdomen: Obese, soft, colostomy  viable Musculoskeletal: Trace edema Skin: Intact  LABS:  CBC  Recent Labs Lab 12/29/13 0242 01/04/2014 0353 12/31/13 0235  WBC 4.6 4.1 6.9  HGB 7.3* 7.9* 8.8*  HCT 22.1* 23.7* 26.9*  PLT 177 192 189   Coag's  Recent Labs Lab 12/16/2013 2246  INR 1.11   BMET  Recent Labs Lab 12/29/13 0242 12/29/2013 0353 12/31/13 0235  NA 139 134* 139  K 4.5 3.8 4.0  CL 101 96 101  CO2 25 25 27   BUN 34* 30* 26*  CREATININE 2.33* 2.16* 1.84*  GLUCOSE 131* 105* 117*   Electrolytes  Recent Labs Lab 12/14/2013 2246 12/28/13 0227 12/29/13 0242 12/13/2013 0353 12/31/13 0235  CALCIUM 9.2 9.1 9.1 9.2 9.3  MG 1.6 1.6 2.0  --   --   PHOS 3.9 3.7  --   --   --    Sepsis Markers  Recent Labs Lab 01/03/2014 2246 12/28/13 0227  LATICACIDVEN 0.9  --   PROCALCITON 0.12 <0.10   ABG  Recent Labs Lab 12/31/2013 1834  PHART 7.410  PCO2ART 40.8  PO2ART 167.0*   Liver Enzymes No results found for this basename: AST, ALT, ALKPHOS, BILITOT, ALBUMIN,  in the last 168 hours  Cardiac Enzymes  Recent Labs Lab 01/02/2014 2246  PROBNP 838.6*   Glucose  Recent Labs Lab 01/07/2014 1820 12/13/2013 2000 01/08/2014 0944  GLUCAP 106* 100* 111*   IMAGING:  No results found.  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 c/b goiter - s/p redo trach 6/19 per ENT, tol ATC ?HCAP - Doubt as no fever, leukocytosis, infiltrates and PCT 0.10 P:   ENT following ([facility]) Goal SpO2>92 Cont ATC as tol  Supplemental oxygen Albuterol PRN pulm hygiene  Will ask ST for trial PMV, cuff deflated  Needs referral to academic facility for thyroidectomy and possible tracheal reconstruction   CARDIOVASCULAR A:  HTN Dyslipidemia  Hx AFib - now NSR Hx DVT  P:  Goal BP<160/90 Amlodipine, Atorvastatin, Carvedilol when able to take POs Labetalol PRN  RENAL A:   Acute on chronic kidney failure Hypomagnesemia  P:   Trend  BMP Cont hold lasix for now - Scr improving  F/u mg, phos  Cont bicarb tabs when taking PO's KVO IVF   GASTROINTESTINAL A:   Nutrition GERD Colostomy status P:   NPO SLP eval 9/20 Protonix as preadmission when able  HEMATOLOGIC A:  Anemia of chronic disease Chronic anticoagulation for AF / DVT - subtherapeutic on transfer Trach site bleeding - resolved P:  Trend CBC Resume heparin gtt 6/20 and monitor for bleeding  If tolerates heparin gtt with no further bleeding consider resume coumadin  Cont aransep, iron   INFECTIOUS A:   HCAP unlikely P:   Observe off abx  ENDOCRINE A:   Hyperthyroidism  P:   Methimazole as preadmission when able See above - needs referral for thyroidectomy prior to d/c   NEUROLOGIC A:   Pain Deconditioning P:   Fentanyl PRN PT / OT  Dirk Dress, NP 12/31/2013  9:28 AM Pager: [phone] or [phone]  I have personally obtained history, examined patient, evaluated and interpreted laboratory and imaging results, reviewed medical records, formulated assessment / plan and placed orders.  Patient with significant bleeding from trach on 6/19, heparin held.  Will restart heparin now.  If no bleeding overnight then will consider transfer to SDU.  Alyson Reedy, M.D. [facility] Pulmonary/Critical Care Medicine. Pager: [phone]. After hours pager: [phone].

## 2013-12-31 NOTE — Evaluation (Signed)
Physical Therapy Evaluation Patient Details Name: Sarah Tran MRN: [id number] DOB: [birth year] Today's Date: 12/31/2013   History of Present Illness  Patient is a 68 yo female admitted 01/20/2014 with resp distress, HCAP, and is s/p trach revision 12/28/2013.  Patient with PMH of recent prolonged hospitalization including tracheostomy, Afib, HTN, DM.  Clinical Impression  Patient presents with problems listed below.  Will benefit from acute PT to maximize independence prior to discharge home with family.    Follow Up Recommendations Home health PT;Supervision/Assistance - 24 hour    Equipment Recommendations  None recommended by PT    Recommendations for Other Services       Precautions / Restrictions Precautions Precautions: Fall Precaution Comments: Trach, PEG Restrictions Weight Bearing Restrictions: No      Mobility  Bed Mobility Overal bed mobility: Needs Assistance;+2 for physical assistance Bed Mobility: Supine to Sit;Sit to Supine     Supine to sit: Mod assist;+2 for physical assistance Sit to supine: Mod assist;+2 for physical assistance   General bed mobility comments: Verbal cues for technique.  Assist to bring LE's off of bed and to raise trunk to sitting position.  Once upright, patient able to maintain balance with min guard assist.  Patient sat EOB x 10 minutes.  Returned to supine wtih +2 assist.  Transfers                    Ambulation/Gait                Stairs            Wheelchair Mobility    Modified Rankin (Stroke Patients Only)       Balance Overall balance assessment: Needs assistance Sitting-balance support: No upper extremity supported;Feet supported Sitting balance-Leahy Scale: Good Sitting balance - Comments: Patient able to move UE's/LE's while sitting EOB.                                     Pertinent Vitals/Pain     Home Living Family/patient expects to be discharged to:: Private  residence Living Arrangements: Children Available Help at Discharge: Family;Available 24 hours/day (Daughter and granddaughter) Type of Home: House Home Access: Ramped entrance     Home Layout: One level Home Equipment: Environmental consultant - 2 wheels;Wheelchair - [employer];Shower seat      Prior Function Level of Independence: Independent with assistive device(s);Needs assistance   Gait / Transfers Assistance Needed: "Sometimes" needs help with transfers to w/c  ADL's / Homemaking Assistance Needed: Assist for bathing, dressing, meal prep, housekeeping.        Hand Dominance   Dominant Hand: Right    Extremity/Trunk Assessment   Upper Extremity Assessment: Generalized weakness           Lower Extremity Assessment: Generalized weakness (Strength 3+/5 - 4-/5)         Communication   Communication: Tracheostomy  Cognition Arousal/Alertness: Awake/alert Behavior During Therapy: WFL for tasks assessed/performed Overall Cognitive Status: Within Functional Limits for tasks assessed (Patient oriented x4)                      General Comments      Exercises General Exercises - Upper Extremity Shoulder Flexion: AROM;Both;5 reps;Seated General Exercises - Lower Extremity Ankle Circles/Pumps: AROM;Both;10 reps;Seated Long Arc Quad: AROM;Both;5 reps;Seated      Assessment/Plan    PT Assessment Patient needs continued PT services  PT Diagnosis Difficulty walking;Generalized weakness;Acute pain   PT Problem List Decreased strength;Decreased activity tolerance;Decreased balance;Decreased mobility;Decreased knowledge of use of DME;Cardiopulmonary status limiting activity;Obesity;Pain  PT Treatment Interventions DME instruction;Gait training;Functional mobility training;Therapeutic exercise;Patient/family education   PT Goals (Current goals can be found in the Care Plan section) Acute Rehab PT Goals Patient Stated Goal: To return home PT Goal Formulation: With  patient/family Time For Goal Achievement: 01/07/14 Potential to Achieve Goals: Good    Frequency Min 3X/week   Barriers to discharge        Co-evaluation               End of Session Equipment Utilized During Treatment: Oxygen Activity Tolerance: Patient limited by fatigue;Patient limited by pain Patient left: in bed;with call bell/phone within reach;with family/visitor present Nurse Communication: Mobility status;Patient requests pain meds         Time: [id number] PT Time Calculation (min): 23 min   Charges:   PT Evaluation $Initial PT Evaluation Tier I: 1 Procedure PT Treatments $Therapeutic Activity: 8-22 mins   PT G Codes:          Vena AustriaDavis, Imari Reen H 12/31/2013, 12:18 PM Durenda HurtSusan H. Renaldo Fiddleravis, PT, [facility] Acute Rehab Services Pager [phone]

## 2013-12-31 NOTE — Progress Notes (Signed)
eLink Physician-Brief Progress Note Patient Name: Sarah Tran DOB: Apr 13, 1946 MRN: [id number]  Date of Service  12/31/2013   HPI/Events of Note   Speech path eval recommended NPO.  Patient has many oral medications  eICU Interventions  NG tube to be placed      Henry Russel, P 12/31/2013, 5:01 PM

## 2013-12-31 NOTE — Progress Notes (Signed)
Attempted to place NG tube and was unsuccessful, 2nd RN attempted and unsuccessful. Will have incoming RN attempt.  Corliss Skains RN

## 2013-12-31 NOTE — Progress Notes (Addendum)
ANTICOAGULATION CONSULT NOTE  Pharmacy Consult for heparin Indication: atrial fibrillation/DVT   Allergies  Allergen Reactions  . Codeine Other (See Comments)    Pass out    . Ertapenem     From  Med list from [facility]   . Fish-Derived Products     From med list from [facility]  . Peanut Butter Flavor Hives and Other (See Comments)    Lockjaw   . Penicillins Swelling    Patient Measurements: Height: 5\' 2"  (157.5 cm) Weight: 245 lb 2.4 oz (111.2 kg) IBW/kg (Calculated) : 50.1 Heparin dosing weight: 77 kg  Vital Signs: Temp: 98.5 F (36.9 C) (06/20 2005) Temp src: Oral (06/20 2005) BP: 176/82 mmHg (06/20 2000) Pulse Rate: 87 (06/20 2000) Intake/Output from previous day: 06/19 0701 - 06/20 0700 In: 1000 [I.V.:1000] Out: 4550 [Urine:4550] Intake/Output from this shift: Total I/O In: 9 [I.V.:9] Out: -    Labs:  Recent Labs  01/07/2014 2246  HGB 7.1*  HCT 22.2*  PLT 167     Recent Labs  12/29/13 0242 12/26/2013 0353 12/31/13 0235  WBC 4.6 4.1 6.9  HGB 7.3* 7.9* 8.8*  PLT 177 192 189  CREATININE 2.33* 2.16* 1.84*   Estimated Creatinine Clearance: 35.4 ml/min (by C-G formula based on Cr of 1.84). No results found for this basename: VANCOTROUGH, VANCOPEAK, VANCORANDOM, GENTTROUGH, GENTPEAK, GENTRANDOM, TOBRATROUGH, TOBRAPEAK, TOBRARND, AMIKACINPEAK, AMIKACINTROU, AMIKACIN,  in the last 72 hours    Medical History: Past Medical History  Diagnosis Date  . Atrial fibrillation     chronic Coumadin  . Anemia of chronic disease   . HTN (hypertension)   . Morbid obesity   . Diabetes mellitus, type II   . Hyperthyroidism   . Hyperlipidemia   . CKD (chronic kidney disease), stage III   . DVT (deep venous thrombosis)     RLE; LUE; chronic Coumadin  . Glaucoma, bilateral   . Heart murmur   . OSA on CPAP   . History of blood transfusion ~ 2004; 11/2013    "while in hospital"  . H/O hiatal hernia   . Arthritis     "joints"  (12/31/2013)  . Gout   . Anxiety     Medications:  See EMR   Assessment: 68 y.o female with recent prolonged hospitalization at [facility] including trach. On chronic anticoagulation for h/o DVT and pAFib.  Pt received warfarin x1 dose on 6/16 and was subsequently held due to possible need to go to the OR.   Pt is s/p trach placement on 6/19, heparin gtt was held until mid-morning 6/20 due to bleeding around trach site.  No further bleeding per RN.  Heparin level remains subtherapeutic.     Goal of Therapy:  Heparin level: 0.3-0.7 Monitor platelets while on anticoagulation: Yes  Plan:  1. Increase heparin gtt to 1200 units/hr 2. Check heparin level in ~ 8 hours 3. Monitor for bleeding   Talbert Cage, PharmD Clinical Pharmacist Pager: [phone] 12/31/2013 9:03 PM

## 2014-01-01 LAB — PHOSPHORUS: Phosphorus: 3.6 mg/dL (ref 2.3–4.6)

## 2014-01-01 LAB — BASIC METABOLIC PANEL
BUN: 23 mg/dL (ref 6–23)
CHLORIDE: 102 meq/L (ref 96–112)
CO2: 23 mEq/L (ref 19–32)
Calcium: 9.3 mg/dL (ref 8.4–10.5)
Creatinine, Ser: 1.78 mg/dL — ABNORMAL HIGH (ref 0.50–1.10)
GFR calc Af Amer: 33 mL/min — ABNORMAL LOW (ref >90)
GFR calc non Af Amer: 29 mL/min — ABNORMAL LOW (ref >90)
GLUCOSE: 104 mg/dL — AB (ref 70–99)
POTASSIUM: 3.9 meq/L (ref 3.7–5.3)
Sodium: 140 mEq/L (ref 137–147)

## 2014-01-01 LAB — CBC
HCT: 25.1 % — ABNORMAL LOW (ref 36.0–46.0)
HEMOGLOBIN: 8.3 g/dL — AB (ref 12.0–15.0)
MCH: 30 pg (ref 26.0–34.0)
MCHC: 33.1 g/dL (ref 30.0–36.0)
MCV: 90.6 fL (ref 78.0–100.0)
Platelets: 181 10*3/uL (ref 150–400)
RBC: 2.77 MIL/uL — ABNORMAL LOW (ref 3.87–5.11)
RDW: 15.6 % — ABNORMAL HIGH (ref 11.5–15.5)
WBC: 7.4 10*3/uL (ref 4.0–10.5)

## 2014-01-01 LAB — HEPARIN LEVEL (UNFRACTIONATED)
HEPARIN UNFRACTIONATED: 0.2 [IU]/mL — AB (ref 0.30–0.70)
Heparin Unfractionated: 0.37 IU/mL (ref 0.30–0.70)

## 2014-01-01 LAB — MAGNESIUM: Magnesium: 1.8 mg/dL (ref 1.5–2.5)

## 2014-01-01 MED ORDER — DEXTROSE-NACL 5-0.45 % IV SOLN
INTRAVENOUS | Status: DC
  Administered 2014-01-01: 13:00:00 via INTRAVENOUS
  Administered 2014-01-01: 1000 mL via INTRAVENOUS
  Administered 2014-01-03: 13:00:00 via INTRAVENOUS
  Administered 2014-01-04: 1000 mL via INTRAVENOUS

## 2014-01-01 MED ORDER — CLONIDINE HCL 0.1 MG/24HR TD PTWK
0.1000 mg | MEDICATED_PATCH | TRANSDERMAL | Status: DC
  Administered 2014-01-01: 0.1 mg via TRANSDERMAL
  Filled 2014-01-01: qty 1

## 2014-01-01 MED ORDER — FUROSEMIDE 10 MG/ML IJ SOLN
40.0000 mg | Freq: Every day | INTRAMUSCULAR | Status: DC
  Administered 2014-01-01: 40 mg via INTRAVENOUS
  Filled 2014-01-01: qty 4

## 2014-01-01 NOTE — Progress Notes (Signed)
Physical Therapy Treatment Patient Details Name: Sarah Tran MRN: [id number] DOB: [birth year] Today's Date: 01/01/2014    History of Present Illness Patient is a 68 yo female admitted Dec 17, 2013 with resp distress, HCAP, and is s/p trach revision 12/16/2013.  Patient with PMH of recent prolonged hospitalization including tracheostomy, Afib, HTN, DM.    PT Comments    Pt progressed well today with no respiratory issues with walking. HR incr to 148 maintained and limited her walking today. Brother present and family is very supportive and [facility] Pt/brother hope she will not need trach when she goes home, however he feels confident that her family can manage pt at home, even with a trach.   Follow Up Recommendations  Home health PT;Supervision/Assistance - 24 hour (Daughter and grandaughter can provide care)     Equipment Recommendations  None recommended by PT    Recommendations for Other Services       Precautions / Restrictions Precautions Precautions: Fall Precaution Comments: Trach Restrictions Weight Bearing Restrictions: No    Mobility  Bed Mobility Overal bed mobility: Needs Assistance;+2 for physical assistance Bed Mobility: Supine to Sit     Supine to sit: Mod assist;HOB elevated;+2 for physical assistance     General bed mobility comments: Verbal cues for technique.  Assist to bring LE's off of bed and to raise trunk to sitting position.  Once upright, patient able to maintain balance with min guard assist.   Transfers Overall transfer level: Needs assistance Equipment used: Rolling walker (2 wheeled) Transfers: Sit to/from Stand Sit to Stand: Min assist         General transfer comment: vc for safe use of RW and steady assist upon come to stand  Ambulation/Gait Ambulation/Gait assistance: Min assist;+2 safety/equipment Ambulation Distance (Feet): 55 Feet Assistive device: Rolling walker (2 wheeled) Gait  Pattern/deviations: Step-through pattern;Decreased stride length Gait velocity: slow   General Gait Details: Pt slightly anxious re: walking due to reported RLE weakness. No buckling noted, vc for proper use of RW and positioning to maximize safety and use of UEs if knee were to buckle. vc to slow pace as HR elevated   Stairs            Wheelchair Mobility    Modified Rankin (Stroke Patients Only)       Balance Overall balance assessment: Needs assistance Sitting-balance support: No upper extremity supported;Feet unsupported Sitting balance-Leahy Scale: Good Sitting balance - Comments: Patient able to move UE's/LE's while sitting EOB.   Standing balance support: Bilateral upper extremity supported Standing balance-Leahy Scale: Poor                      Cognition Arousal/Alertness: Awake/alert Behavior During Therapy: WFL for tasks assessed/performed Overall Cognitive Status: Within Functional Limits for tasks assessed (Patient oriented x4)                      Exercises General Exercises - Lower Extremity Ankle Circles/Pumps: AROM;Both;20 reps Quad Sets: AROM;Both;20 reps Long Arc Quad: AROM;Both;5 reps;Seated Hip ABduction/ADduction: AROM;Both;5 reps;Seated Toe Raises: AROM;Both;10 reps;Seated Heel Raises: AROM;Both;10 reps;Seated Other Exercises Other Exercises: Educated on briding exercise to try with her brother "anchoring" her feet when she returns to bed.    General Comments General comments (skin integrity, edema, etc.): Brother present throughout session.       Pertinent Vitals/Pain SaO2 97-100% on 28% trach collar HR 110 at rest, gradual incr to 139 and  then up to 148 while walking. With standing rest, her HR decr to 132 and pt returned to sitting in room. After seated rest x 2 minutes, HR 112.  Denied pain    Home Living                      Prior Function            PT Goals (current goals can now be found in the care  plan section) Acute Rehab PT Goals Patient Stated Goal: To return home Progress towards PT goals: Progressing toward goals    Frequency  Min 3X/week    PT Plan Current plan remains appropriate    Co-evaluation             End of Session Equipment Utilized During Treatment: Oxygen;Gait belt Activity Tolerance: Patient limited by fatigue Patient left: with call bell/phone within reach;with family/visitor present;in chair     Time: 1245-8099 PT Time Calculation (min): 33 min  Charges:  $Gait Training: 8-22 mins $Therapeutic Exercise: 8-22 mins                    G Codes:      SASSER,LYNN 01-21-14, 11:31 AM Pager [phone]

## 2014-01-01 NOTE — Progress Notes (Signed)
ANTICOAGULATION CONSULT NOTE  Pharmacy Consult for heparin Indication: atrial fibrillation/DVT   Allergies  Allergen Reactions  . Codeine Other (See Comments)    Pass out    . Ertapenem     From  Med list from [facility]   . Fish-Derived Products     From med list from [facility]  . Peanut Butter Flavor Hives and Other (See Comments)    Lockjaw   . Penicillins Swelling    Patient Measurements: Height: 5\' 2"  (157.5 cm) Weight: 244 lb 0.8 oz (110.7 kg) IBW/kg (Calculated) : 50.1 Heparin dosing weight: 77 kg  Vital Signs: Temp: 98.8 F (37.1 C) (06/21 1200) Temp src: Oral (06/21 1200) BP: 160/77 mmHg (06/21 1400) Pulse Rate: 93 (06/21 1500) Intake/Output from previous day: 06/20 0701 - 06/21 0700 In: 203.4 [I.V.:203.4] Out: 785 [Urine:415; Stool:370] Intake/Output from this shift: Total I/O In: 216 [I.V.:216] Out: 1300 [Urine:1100; Stool:200]   Labs:  Recent Labs  12/26/2013 2246  HGB 7.1*  HCT 22.2*  PLT 167     Recent Labs  12/20/2013 0353 12/31/13 0235 01/01/14 0320  WBC 4.1 6.9 7.4  HGB 7.9* 8.8* 8.3*  PLT 192 189 181  CREATININE 2.16* 1.84* 1.78*   Estimated Creatinine Clearance: 36.5 ml/min (by C-G formula based on Cr of 1.78). No results found for this basename: VANCOTROUGH, VANCOPEAK, VANCORANDOM, GENTTROUGH, GENTPEAK, GENTRANDOM, TOBRATROUGH, TOBRAPEAK, TOBRARND, AMIKACINPEAK, AMIKACINTROU, AMIKACIN,  in the last 72 hours    Medical History: Past Medical History  Diagnosis Date  . Atrial fibrillation     chronic Coumadin  . Anemia of chronic disease   . HTN (hypertension)   . Morbid obesity   . Diabetes mellitus, type II   . Hyperthyroidism   . Hyperlipidemia   . CKD (chronic kidney disease), stage III   . DVT (deep venous thrombosis)     RLE; LUE; chronic Coumadin  . Glaucoma, bilateral   . Heart murmur   . OSA on CPAP   . History of blood transfusion ~ 2004; 11/2013    "while in hospital"  . H/O  hiatal hernia   . Arthritis     "joints" (01/05/2014)  . Gout   . Anxiety     Medications:  See EMR   Assessment: 68 y.o female with recent prolonged hospitalization at [facility] including trach. On chronic anticoagulation for h/o DVT and pAFib.  Pt received warfarin x1 dose on 6/16 and was subsequently held.   Pt is s/p trach placement on 6/19, heparin gtt was held until mid-morning 6/20 due to bleeding around trach site.  No further bleeding per RN.  Heparin level is therapeutic x1.   Goal of Therapy:  Heparin level: 0.3-0.7 Monitor platelets while on anticoagulation: Yes   Plan:  1. Continue heparin at 1200 units/hr 2. Daily HL, CBC 3. Monitor for s/sx bleeding    Agapito Games, PharmD, BCPS Clinical Pharmacist Pager: [phone] 01/01/2014 3:34 PM

## 2014-01-01 NOTE — Progress Notes (Signed)
PULMONARY / CRITICAL CARE MEDICINE  Name: Sarah Tran MRN: [id number] DOB: Sep 25, 1945    ADMISSION DATE:  12/17/2013 CONSULTATION DATE:  12/26/2013   REFERRING MD :  EDP PRIMARY SERVICE: PCCM  CHIEF COMPLAINT:  Respiratory Distress  BRIEF PATIENT DESCRIPTION: 68 yo with recent prolonged hospitalization including tracheostomy, ultimately decannulated and d/c 6/1. Returned to OSH 6/16 for respiratory distress. CXR suggestive of PNA.  Transferred to [facility] concerns that she may need emergent bedside trach / airway. PCCM was consulted as primary.  SIGNIFICANT EVENTS / STUDIES:   6/17  Laryngoscopy by ENT >>> Normal vocal cords, tracheomalacia / stenosis 6/19 Tracheostomy by ENT 6/20  Failed swallow eval   LINES / TUBES: Foley 6/16 >>> Trach (ENT) 6/19 >>>  CULTURES: Blood 6/16 >>> Urine 6/16 >>> neg  ANTIBIOTICS: Levaquin 6/16 >>> 6/17 Vancomycin 6/16 >>> 6/17  INTERVIEW HISTORY:  Failed swallow eval, difficulty with PMV per ST.  No bleeding noted on heparin gtt.   VITAL SIGNS: Temp:  [98.2 F (36.8 C)-98.8 F (37.1 C)] 98.8 F (37.1 C) (06/21 0431) Pulse Rate:  [72-107] 92 (06/21 0800) Resp:  [10-21] 10 (06/21 0800) BP: (134-176)/(54-95) 152/65 mmHg (06/21 0800) SpO2:  [74 %-100 %] 99 % (06/21 0800) FiO2 (%):  [28 %] 28 % (06/21 0316) Weight:  [244 lb 0.8 oz (110.7 kg)] 244 lb 0.8 oz (110.7 kg) (06/21 0431)  HEMODYNAMICS:   VENTILATOR SETTINGS: Vent Mode:  [-]  FiO2 (%):  [28 %] 28 %  INTAKE / OUTPUT: Intake/Output     06/20 0701 - 06/21 0700 06/21 0701 - 06/22 0700   I.V. (mL/kg) 203.4 (1.8) 12 (0.1)   Total Intake(mL/kg) 203.4 (1.8) 12 (0.1)   Urine (mL/kg/hr) 415 (0.2) 200 (1.5)   Stool 370 (0.1) 200 (1.5)   Total Output 785 400   Net -581.7 -388         PHYSICAL EXAMINATION: General: Resting in no distress Neuro: Awake, alert, communicates  HEENT: mm moist, no JVD, no bleeding at trach site noted  Cardiovascular: Regular, no murmurs Lungs: resps even non  labored on ATC, CTAB Abdomen: Obese, soft, colostomy  Musculoskeletal: Trace edema Skin: Intact  LABS:  CBC  Recent Labs Lab 01/01/2014 0353 12/31/13 0235 01/01/14 0320  WBC 4.1 6.9 7.4  HGB 7.9* 8.8* 8.3*  HCT 23.7* 26.9* 25.1*  PLT 192 189 181   Coag's  Recent Labs Lab 12/17/2013 2246  INR 1.11   BMET  Recent Labs Lab 01/09/2014 0353 12/31/13 0235 01/01/14 0320  NA 134* 139 140  K 3.8 4.0 3.9  CL 96 101 102  CO2 25 27 23   BUN 30* 26* 23  CREATININE 2.16* 1.84* 1.78*  GLUCOSE 105* 117* 104*   Electrolytes  Recent Labs Lab 12/24/2013 2246 12/28/13 0227 12/29/13 0242 01/07/2014 0353 12/31/13 0235 01/01/14 0320  CALCIUM 9.2 9.1 9.1 9.2 9.3 9.3  MG 1.6 1.6 2.0  --   --  1.8  PHOS 3.9 3.7  --   --   --  3.6   Sepsis Markers  Recent Labs Lab 12/26/2013 2246 12/28/13 0227  LATICACIDVEN 0.9  --   PROCALCITON 0.12 <0.10   ABG  Recent Labs Lab 12/24/2013 1834  PHART 7.410  PCO2ART 40.8  PO2ART 167.0*   Liver Enzymes No results found for this basename: AST, ALT, ALKPHOS, BILITOT, ALBUMIN,  in the last 168 hours  Cardiac Enzymes  Recent Labs Lab 12/21/2013 2246  PROBNP 838.6*   Glucose  Recent Labs Lab 12/18/2013 1820  12/20/2013 2000 01/09/2014 0944  GLUCAP 106* 100* 111*   IMAGING:   No results found.  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  Did not tol trial PMV.  Trach site bleeding - resolved.  P:   -ENT following Lazarus Salines([place]) -Goal SpO2>92 -Cont ATC as tol  -Albuterol PRN -pulm hygiene  -Intermittent f/u CXR  -ST cont to follow for PMV - pt not tol well  -Needs referral to academic facility for thyroidectomy and possible tracheal reconstruction   CARDIOVASCULAR A:  HTN Dyslipidemia  Hx AFib - now NSR Hx DVT  P:  -Goal BP<160/90 -Amlodipine, Atorvastatin, Carvedilol when able to take POs -add  clonidine patch 6/21  -Labetalol PRN  RENAL A:   Acute on chronic kidney failure - improving  Hypomagnesemia  P:   -Trend BMP -Cont hold lasix  -F/u mg, phos intermittently  -Cont bicarb tabs when taking PO's -Gentle IVF while not taking PO's/no TF   GASTROINTESTINAL A:   Nutrition GERD Colostomy status Dysphagia - hx PEG.  Did not tol PMV, difficulty swallowing oral secretions at times. ?if this will improve as post op swelling subsides.  P:   -NPO -SLP cont to follow  -Will need MBS - ST wants to wait until she tol PMV -Pt very hesitant to have NG attempted again (4 attempts overnight) - agreed to hold off on further attempts today and have ST re-eval 6/22.  At that time if she cannot take PO's will need IR feeding tube.   HEMATOLOGIC A:  Anemia of chronic disease Chronic anticoagulation for AF / DVT - subtherapeutic on transfer Trach site bleeding - resolved P:  -Trend CBC -Continue heparin gtt and monitor for bleeding  -If cont to tol heparin gtt with no further bleeding resume coumadin when able to take PO's -Cont aransep, iron   INFECTIOUS A:   HCAP unlikely P:   -Observe off abx  ENDOCRINE A:   Hyperthyroidism  P:   -Methimazole as preadmission when able -See above - needs referral for thyroidectomy prior to d/c   NEUROLOGIC A:   Pain Deconditioning P:   -Fentanyl PRN -PT / OT  Tx SDU.    Dirk DressKaty Whiteheart, NP 01/01/2014  8:41 AM Pager: [phone] or [phone]  Transfer to SDU, if unable to tolerate PMV today then will send down to IR for placement of panda tube.  *Care during the described time interval was provided by me and/or other providers on the critical care team. I have reviewed this patient's available data, including medical history, events of note, physical examination and test results as part of my evaluation.  Alyson ReedyWesam G. Yacoub, M.D. [facility]Critical [facility]. Pager: [phone]. After hours pager:  [phone].

## 2014-01-02 DIAGNOSIS — R0902 Hypoxemia: Secondary | ICD-10-CM

## 2014-01-02 LAB — CBC
HCT: 23.5 % — ABNORMAL LOW (ref 36.0–46.0)
Hemoglobin: 7.6 g/dL — ABNORMAL LOW (ref 12.0–15.0)
MCH: 29.3 pg (ref 26.0–34.0)
MCHC: 32.3 g/dL (ref 30.0–36.0)
MCV: 90.7 fL (ref 78.0–100.0)
Platelets: 168 10*3/uL (ref 150–400)
RBC: 2.59 MIL/uL — ABNORMAL LOW (ref 3.87–5.11)
RDW: 15.5 % (ref 11.5–15.5)
WBC: 7.5 10*3/uL (ref 4.0–10.5)

## 2014-01-02 LAB — HEPARIN LEVEL (UNFRACTIONATED): HEPARIN UNFRACTIONATED: 0.31 [IU]/mL (ref 0.30–0.70)

## 2014-01-02 MED ORDER — SCOPOLAMINE 1 MG/3DAYS TD PT72
1.0000 | MEDICATED_PATCH | TRANSDERMAL | Status: DC
  Administered 2014-01-02 – 2014-01-05 (×2): 1.5 mg via TRANSDERMAL
  Filled 2014-01-02 (×2): qty 1

## 2014-01-02 MED ORDER — HYDRALAZINE HCL 20 MG/ML IJ SOLN
10.0000 mg | Freq: Four times a day (QID) | INTRAMUSCULAR | Status: DC
  Administered 2014-01-02 – 2014-01-05 (×13): 10 mg via INTRAVENOUS
  Filled 2014-01-02: qty 0.5
  Filled 2014-01-02: qty 1
  Filled 2014-01-02: qty 0.5
  Filled 2014-01-02 (×3): qty 1
  Filled 2014-01-02 (×7): qty 0.5
  Filled 2014-01-02: qty 1
  Filled 2014-01-02: qty 0.5
  Filled 2014-01-02: qty 1
  Filled 2014-01-02: qty 0.5
  Filled 2014-01-02: qty 1
  Filled 2014-01-02: qty 0.5
  Filled 2014-01-02: qty 1

## 2014-01-02 MED ORDER — CLONIDINE HCL 0.2 MG/24HR TD PTWK: 0.2000 mg | MEDICATED_PATCH | TRANSDERMAL | Status: DC

## 2014-01-02 NOTE — Progress Notes (Signed)
PULMONARY / CRITICAL CARE MEDICINE  Name: Sarah Tran MRN: [id number] DOB: June 05, 1946    ADMISSION DATE:  01/21/2014 CONSULTATION DATE:  January 21, 2014   REFERRING MD :  EDP PRIMARY SERVICE: PCCM  CHIEF COMPLAINT:  Respiratory Distress  BRIEF PATIENT DESCRIPTION: 68 yo with recent prolonged hospitalization including tracheostomy, ultimately decannulated and d/c 6/1. Returned to OSH 6/16 for respiratory distress. CXR suggestive of PNA.  Transferred to [facility] concerns that she may need emergent bedside trach / airway. PCCM was consulted as primary.  SIGNIFICANT EVENTS / STUDIES:   6/17  Laryngoscopy by ENT >>> Normal vocal cords, tracheomalacia / stenosis 6/19 Tracheostomy by ENT 6/20  Failed swallow eval   LINES / TUBES: Foley 6/16 >>> Trach (ENT) 6/19 >>>  CULTURES: Blood 6/16 >>> Urine 6/16 >>> neg  ANTIBIOTICS: Levaquin 6/16 >>> 6/17 Vancomycin 6/16 >>> 6/17  INTERVIEW HISTORY:  Failed swallow eval, supposed to be re-evaluated today  VITAL SIGNS: Temp:  [98.4 F (36.9 C)-99.8 F (37.7 C)] 98.5 F (36.9 C) (06/22 0854) Pulse Rate:  [77-113] 81 (06/22 0800) Resp:  [12-29] 15 (06/22 0800) BP: (145-187)/(59-83) 179/79 mmHg (06/22 0800) SpO2:  [97 %-100 %] 100 % (06/22 0800) FiO2 (%):  [28 %] 28 % (06/22 0740) Weight:  [106.6 kg (235 lb 0.2 oz)] 106.6 kg (235 lb 0.2 oz) (06/22 0500)  HEMODYNAMICS:   VENTILATOR SETTINGS: Vent Mode:  [-]  FiO2 (%):  [28 %] 28 %  INTAKE / OUTPUT: Intake/Output     06/21 0701 - 06/22 0700 06/22 0701 - 06/23 0700   I.V. (mL/kg) 996 (9.3) 104.3 (1)   Total Intake(mL/kg) 996 (9.3) 104.3 (1)   Urine (mL/kg/hr) 1625 (0.6)    Stool     Total Output 1625     Net -629 +104.3         PHYSICAL EXAMINATION: General: Resting in no distress Neuro: Awake, alert, communicates  HEENT: mm moist, no JVD, no bleeding at trach site noted  Cardiovascular: Regular, no murmurs Lungs: resps even non labored on ATC, CTAB Abdomen: Obese, soft, colostomy   Musculoskeletal: Trace edema Skin: Intact  LABS:  CBC  Recent Labs Lab 12/31/13 0235 01/01/14 0320 01/02/14 0200  WBC 6.9 7.4 7.5  HGB 8.8* 8.3* 7.6*  HCT 26.9* 25.1* 23.5*  PLT 189 181 168   Coag's  Recent Labs Lab 01/21/14 2246  INR 1.11   BMET  Recent Labs Lab 12/28/2013 0353 12/31/13 0235 01/01/14 0320  NA 134* 139 140  K 3.8 4.0 3.9  CL 96 101 102  CO2 25 27 23   BUN 30* 26* 23  CREATININE 2.16* 1.84* 1.78*  GLUCOSE 105* 117* 104*   Electrolytes  Recent Labs Lab 01-21-2014 2246 12/28/13 0227 12/29/13 0242 12/28/2013 0353 12/31/13 0235 01/01/14 0320  CALCIUM 9.2 9.1 9.1 9.2 9.3 9.3  MG 1.6 1.6 2.0  --   --  1.8  PHOS 3.9 3.7  --   --   --  3.6   Sepsis Markers  Recent Labs Lab 01/21/14 2246 12/28/13 0227  LATICACIDVEN 0.9  --   PROCALCITON 0.12 <0.10   ABG  Recent Labs Lab Jan 21, 2014 1834  PHART 7.410  PCO2ART 40.8  PO2ART 167.0*   Liver Enzymes No results found for this basename: AST, ALT, ALKPHOS, BILITOT, ALBUMIN,  in the last 168 hours  Cardiac Enzymes  Recent Labs Lab 2014-01-21 2246  PROBNP 838.6*   Glucose  Recent Labs Lab 01-21-2014 1820 01-21-14 2000 12/15/2013 0944  GLUCAP 106* 100* 111*  IMAGING:   No results found.  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  Did not tol trial PMV.  Trach site bleeding - resolved.  P:   -ENT following (Dr Lazarus SalinesWolicki) -Cont ATC as tol  -Albuterol PRN -pulm hygiene  -Intermittent f/u CXR  -ST cont to follow for PMV -Needs referral to academic facility for thyroidectomy and possible tracheal reconstruction   CARDIOVASCULAR A:  HTN Dyslipidemia  Hx AFib - now NSR Hx DVT  P:  -Goal BP<160/90 -Amlodipine, Atorvastatin, Carvedilol when able to take POs -added clonidine patch 6/21, increase 6/22 -Labetalol PRN -add hydralazine scheduled 6/22 until  he can take PO  RENAL A:   Acute on chronic kidney failure - improving  Hypomagnesemia  P:   -Trend BMP -Cont hold lasix, consider gentle restart 6/23 -F/u mg, phos intermittently  -Cont bicarb tabs when taking PO's -Gentle IVF while not taking PO's/no TF   GASTROINTESTINAL A:   Nutrition GERD Colostomy status Dysphagia - hx PEG.  Did not tol PMV, difficulty swallowing oral secretions at times. ?if this will improve as post op swelling subsides.  P:   -NPO -SLP cont to follow  -Will need MBS - ST wants to wait until she tol PMV -Pt very hesitant to have NG attempted again (4 attempts overnight) - agreed to have ST re-eval 6/22.  At that time if she cannot take PO's will need IR feeding tube.   HEMATOLOGIC A:  Anemia of chronic disease Chronic anticoagulation for AF / DVT - subtherapeutic on transfer Trach site bleeding - resolved P:  -Trend CBC -Continue heparin gtt and monitor for bleeding  -If cont to tol heparin gtt with no further bleeding resume coumadin when able to take PO's -Cont aransep, iron   INFECTIOUS A:   HCAP unlikely P:   -Observe off abx  ENDOCRINE A:   Hyperthyroidism  P:   -Methimazole as preadmission when able -See above - needs referral for thyroidectomy prior to d/c   NEUROLOGIC A:   Pain Deconditioning P:   -Fentanyl PRN -PT / OT  Has SDU orders.  [facility] to assume care 6/22   *Care during the described time interval was provided by me and/or other providers on the critical care team. I have reviewed this patient's available data, including medical history, events of note, physical examination and test results as part of my evaluation.  Levy Pupaobert Byrum, MD, PhD 01/02/2014, 10:14 AM [facility] [phone] or if no answer [phone]

## 2014-01-02 NOTE — Progress Notes (Signed)
OT Cancellation Note  Patient Details Name: Kerly Wears MRN: [id number] DOB: [birth year]   Cancelled Treatment:    Reason Eval/Treat Not Completed: Patient at procedure or test/ unavailable  Harolyn Rutherford Pager: [phone]  01/02/2014, 4:02 PM

## 2014-01-02 NOTE — Evaluation (Addendum)
Clinical/Bedside Swallow Evaluation Patient Details  Name: Sarah Tran MRN: [id number] Date of Birth: 02/16/1946  Today's Date: 01/02/2014 Time: 7824-2353 SLP Time Calculation (min): 32 min  Past Medical History:  Past Medical History  Diagnosis Date  . Atrial fibrillation     chronic Coumadin  . Anemia of chronic disease   . HTN (hypertension)   . Morbid obesity   . Diabetes mellitus, type II   . Hyperthyroidism   . Hyperlipidemia   . CKD (chronic kidney disease), stage III   . DVT (deep venous thrombosis)     RLE; LUE; chronic Coumadin  . Glaucoma, bilateral   . Heart murmur   . OSA on CPAP   . History of blood transfusion ~ 2004; 11/2013    "while in hospital"  . H/O hiatal hernia   . Arthritis     "joints" (12/20/2013)  . Gout   . Anxiety    Past Surgical History:  Past Surgical History  Procedure Laterality Date  . Exploratory laparotomy  09/2013    appy, diverting colostomy for sigmoid perf following removal of ileal polyp.   . Gastrostomy tube placement  10/2013  . Tracheostomy  10/2013    placed in [facility]. Trach decannulated 11/18/2013 at [facility].  . Cholecystectomy    . Carpal tunnel release Right   . Appendectomy    . Vaginal hysterectomy      "partial"  . Cardiac catheterization    . Tracheostomy closure     HPI:  68 yo female adm to [facility] with respiratory distress requiring s/p trach placement Friday 12/25/2013.  Pt for PMSV and swallow evaluation per CCS. Pt has a prior history of trach, but was evaluated for dysphagia by LTACH and CIR, started on dys 3 diet and thin liquids and upgraded to regular, which pt has been tolerating at home.   Per ENT- hopeful get her downsized to a #4 and use Passy-Muir valve for  communication. CT scan to be repeated in 6 weeks to see if the partial thyroidectomy will help open the tracheal lumen per ENT.  And ENT hopes for decannulation in long term.       Assessment / Plan / Recommendation Clinical Impression  With PMSV in  place pt demonstrates adequate swallow function with thin liquids and regular solids. No evidence of dysphagia or concern for aspiration over multiple trials. Recommend pt wear PMSV with all POs and follow basic aspiration precautions. Pt and daughter verbalize understanding and agreed to regular diet with thin liquids to give the freedom for pt to choose foods she feels is appropriate. SLP will f/u for PMSV and also check for diet tolerance.     Aspiration Risk  Mild    Diet Recommendation Thin liquid;Regular   Liquid Administration via: Cup;Straw Medication Administration: Whole meds with liquid Supervision: Patient able to self feed;Intermittent supervision to cue for compensatory strategies Compensations: Slow rate;Small sips/bites Postural Changes and/or Swallow Maneuvers: Seated upright 90 degrees    Other  Recommendations Oral Care Recommendations: Oral care BID   Follow Up Recommendations  24 hour supervision/assistance    Frequency and Duration min 2x/week  2 weeks   Pertinent Vitals/Pain NA    SLP Swallow Goals     Swallow Study Prior Functional Status       General HPI: 68 yo female adm to [facility] with respiratory distress requiring s/p trach placement Friday 01/03/2014.  Pt for PMSV and swallow evaluation per CCS. Pt has a prior history of trach, but  was evalauted for dysphagia by LTACH and CIR, started on dys 3 diet and thinl iquids and upgraded to regular, which pt has been toelratin at home.   Per ENT- hopeful get her downsized to a #4 and use Passey -Muir valve for  communication. CT scan to be repeated in 6 weeks to see if the partial thyroidectomy will help open the tracheal lumen per ENT.  And ENT hopes for decannulation in long term.  Pt does not recall using PMSV or having swallow evalution in xray previously.   Type of Study: Bedside swallow evaluation [facility] and pt family report indicate no significant dysphagia.   Diet Prior to this Study: NPO Temperature Spikes Noted: No Respiratory Status: Room air Trach Size and Type: Cuff;#6;Deflated;With PMSV in place History of Recent Intubation: No Behavior/Cognition: Alert;Cooperative;Pleasant mood;Requires cueing Oral Cavity - Dentition: Adequate natural dentition Self-Feeding Abilities: Able to feed self Patient Positioning: Upright in chair Baseline Vocal Quality: Clear Volitional Cough: Strong Volitional Swallow: Able to elicit    Oral/Motor/Sensory Function Overall Oral Motor/Sensory Function: Appears within functional limits for tasks assessed   Ice Chips     Thin Liquid Thin Liquid: Within functional limits    Nectar Thick Nectar Thick Liquid: Not tested   Honey Thick Honey Thick Liquid: Not tested   Puree Puree: Within functional limits   Solid   GO    Solid: Within functional limits      [facility] AshlandBonnie Jasline Buskirk, MA CCC-SLP [phone]  Claudine MoutonDeBlois, Chemere Steffler Caroline 01/02/2014,4:55 PM

## 2014-01-02 NOTE — Progress Notes (Signed)
01/02/2014 Patient transfer from 29m to 2 central at 1445. She is alert, oriented and up with assist. Patient have unsteady gait and take 2 people to assist with ambulation. Patient have a foam dressing on abdomen where old peg tube was removed the area is dry. Foam dressing to sacrum and intact. Patient have bruises on bilateral arms, hands and under the right arm pit there is a bruise area. Patient have a trach shiley # 6 and on 28% trach collar. [facility] RN.

## 2014-01-02 NOTE — Progress Notes (Addendum)
Speech Language Pathology Treatment: Hillary Bow Speaking valve  Patient Details Name: Sarah Tran MRN: [id number] DOB: 1945-08-02 Today's Date: 01/02/2014 Time: 6962-9528 SLP Time Calculation (min): 32 min  Assessment / Plan / Recommendation Clinical Impression  Pt immediately demonstrated excellent airflow to the upper airway, no Co2 trapping or change in vital signs for 30 minutes. SLP instructed pt and daughter on PMSV placement and removal, pt return demonstrated with use of a mirror for visual feedback. They both verbalized primary precautions and read written instruction placed in room. Pt may wear PMSV during all waking hours with intermittent supervision from RN. SLP discussed instructions with RN. Pt would benefit from a cuffless trach to simplify care prior to d/c.    HPI 68 yo female adm to [facility] with respiratory distress requiring s/p trach placement Friday 12/25/2013.  Pt for PMSV and swallow evaluation per CCS. Pt has a prior history of trach, but was evaluated for dysphagia by LTACH and CIR, started on dys 3 diet and thin liquids and upgraded to regular, which pt has been tolerating at home.   Per ENT- hopeful get her downsized to a #4 and use Passy-Muir valve for  communication. CT scan to be repeated in 6 weeks to see if the partial thyroidectomy will help open the tracheal lumen per ENT.  And ENT hopes for decannulation in long term.     Pertinent Vitals NA  SLP Plan  Continue with current plan of care    Recommendations Medication Administration: Whole meds with liquid Supervision: Patient able to self feed;Intermittent supervision to cue for compensatory strategies Compensations: Slow rate;Small sips/bites Postural Changes and/or Swallow Maneuvers: Seated upright 90 degrees      Patient may use Passy-Muir Speech Valve: During all waking hours (remove during sleep) PMSV Supervision: Intermittent MD: Please consider changing trach tube to : Cuffless;Smaller size       Oral Care  Recommendations: Oral care BID Follow up Recommendations: 24 hour supervision/assistance Plan: Continue with current plan of care    GO    [facility], MA CCC-SLP [phone]  Claudine Mouton 01/02/2014, 4:59 PM

## 2014-01-02 NOTE — Care Management Note (Signed)
    Page 1 of 1   01/02/2014     2:25:29 PM CARE MANAGEMENT NOTE 01/02/2014  Patient:  Sarah Tran, Sarah Tran   Account Number:  [id number]  Date Initiated:  12/28/2013  Documentation initiated by:  [facility]  Subjective/Objective Assessment:   Admitted with resp failure - on bipap - helicoptered in from outside hospital with possible need for trach - has had in past - difficult airway history.     Action/Plan:   Anticipated DC Date:  01/04/2014   Anticipated DC Plan:  HOME W HOME HEALTH SERVICES      DC Planning Services  CM consult      Choice offered to / List presented to:             Status of service:  In process, will continue to follow Medicare Important Message given?   (If response is "NO", the following Medicare IM given date fields will be blank) Date Medicare IM given:   Date Additional Medicare IM given:    Discharge Disposition:    Per UR Regulation:  Reviewed for med. necessity/level of care/duration of stay  If discussed at Long Length of Stay Meetings, dates discussed:   01/03/2014    Comments:  ContactExie Parody Brother     [phone]                 Palmer,Donna Daughter     [phone]                 Varin,Katrina Daughter [phone]  6/22 1420p debbie dowell rn,bsn pt act w [facility] in [place] ph [phone]. [facility] uses company called [facility] in [place] for eq phone for them is  [phone]. pt plans to return home at disch. has new trach and will need supplies.

## 2014-01-02 NOTE — Progress Notes (Signed)
ANTICOAGULATION CONSULT NOTE  Pharmacy Consult for heparin Indication: atrial fibrillation/DVT   Allergies  Allergen Reactions  . Codeine Other (See Comments)    Pass out    . Ertapenem     From  Med list from [facility]   . Fish-Derived Products     From med list from [facility]  . Peanut Butter Flavor Hives and Other (See Comments)    Lockjaw   . Penicillins Swelling    Patient Measurements: Height: 5\' 2"  (157.5 cm) Weight: 235 lb 0.2 oz (106.6 kg) IBW/kg (Calculated) : 50.1 Heparin dosing weight: 77 kg  Vital Signs: Temp: 98.4 F (36.9 C) (06/22 0429) Temp src: Oral (06/22 0429) BP: 169/60 mmHg (06/22 0740) Pulse Rate: 83 (06/22 0740) Intake/Output from previous day: 06/21 0701 - 06/22 0700 In: 996 [I.V.:996] Out: 1625 [Urine:1625] Intake/Output from this shift:     Labs:  Recent Labs  01/13/14 2246  HGB 7.1*  HCT 22.2*  PLT 167     Recent Labs  12/31/13 0235 01/01/14 0320 01/02/14 0200  WBC 6.9 7.4 7.5  HGB 8.8* 8.3* 7.6*  PLT 189 181 168  CREATININE 1.84* 1.78*  --    Estimated Creatinine Clearance: 35.7 ml/min (by C-G formula based on Cr of 1.78).  Medications:  See EMR  Assessment: 68 y.o female with recent prolonged hospitalization at [facility] including trach. On chronic anticoagulation for h/o DVT and pAFib.  Pt received warfarin x1 dose on 6/16 and was subsequently held due to possible need to go to the OR.   Pt is s/p trach placement on 6/19, heparin gtt was held until mid-morning 6/20 due to bleeding around trach site.  No further bleeding per RN.  Heparin level is on the low end of therapeutic.  Hgb down to 7.6, Plts 168.   Goal of Therapy:  Heparin level: 0.3-0.7 Monitor platelets while on anticoagulation: Yes  Plan:  1. Increase heparin gtt to 1250 units/hr 2. Follow-up heparin level and CBC in AM 3. Monitor for bleeding  Link Snuffer, PharmD, BCPS Clinical Pharmacist [phone] 01/02/2014 8:01  AM

## 2014-01-02 NOTE — Progress Notes (Signed)
Physical Therapy Treatment Patient Details Name: Brittay Celis MRN: [id number] DOB: 02-Sep-1945 Today's Date: January 24, 2014    History of Present Illness Patient is a 68 yo female admitted 01-18-14 with resp distress, HCAP, and is s/p trach revision 12/27/2013.  Patient with PMH of recent prolonged hospitalization including tracheostomy, Afib, HTN, DM.    PT Comments    Pt making steady progress.  Follow Up Recommendations  Home health PT;Supervision/Assistance - 24 hour     Equipment Recommendations  None recommended by PT    Recommendations for Other Services       Precautions / Restrictions Precautions Precautions: Fall Precaution Comments: Trach Restrictions Weight Bearing Restrictions: No    Mobility  Bed Mobility                  Transfers Overall transfer level: Needs assistance Equipment used: Rolling walker (2 wheeled) Transfers: Sit to/from Stand Sit to Stand: Min assist         General transfer comment: Verbal cues for hand placement and steady assist to come up  Ambulation/Gait Ambulation/Gait assistance: Min assist;+2 safety/equipment Ambulation Distance (Feet): 105 Feet Assistive device: Rolling walker (2 wheeled) Gait Pattern/deviations: Step-through pattern;Decreased stride length Gait velocity: slow Gait velocity interpretation: Below normal speed for age/gender General Gait Details: Assist for support. Pt took two standing rest breaks. Pt performed side-stepping into and out of bathroom.   Stairs            Wheelchair Mobility    Modified Rankin (Stroke Patients Only)       Balance   Sitting-balance support: No upper extremity supported;Feet supported Sitting balance-Leahy Scale: Good     Standing balance support: Bilateral upper extremity supported Standing balance-Leahy Scale: Poor                      Cognition Arousal/Alertness: Awake/alert Behavior During Therapy: WFL for tasks assessed/performed Overall  Cognitive Status: Within Functional Limits for tasks assessed                      Exercises      General Comments        Pertinent Vitals/Pain HR to 140 with amb. Returns to 120 quickly when pt stops.    Home Living                      Prior Function            PT Goals (current goals can now be found in the care plan section) Progress towards PT goals: Progressing toward goals    Frequency  Min 3X/week    PT Plan Current plan remains appropriate    Co-evaluation             End of Session Equipment Utilized During Treatment: Oxygen;Gait belt Activity Tolerance: Patient tolerated treatment well Patient left: in chair;with call bell/phone within reach;with family/visitor present     Time: 1314-3888 PT Time Calculation (min): 24 min  Charges:  $Gait Training: 23-37 mins                    G Codes:      MAYCOCK,CARY 01-24-2014, 4:26 PM  [employer] PT [phone]

## 2014-01-03 ENCOUNTER — Encounter (HOSPITAL_COMMUNITY): Payer: Self-pay | Admitting: Otolaryngology

## 2014-01-03 ENCOUNTER — Inpatient Hospital Stay (HOSPITAL_COMMUNITY): Payer: Medicare Other

## 2014-01-03 DIAGNOSIS — I428 Other cardiomyopathies: Secondary | ICD-10-CM

## 2014-01-03 DIAGNOSIS — G92 Toxic encephalopathy: Secondary | ICD-10-CM

## 2014-01-03 DIAGNOSIS — E876 Hypokalemia: Secondary | ICD-10-CM | POA: Diagnosis present

## 2014-01-03 DIAGNOSIS — D638 Anemia in other chronic diseases classified elsewhere: Secondary | ICD-10-CM

## 2014-01-03 DIAGNOSIS — I420 Dilated cardiomyopathy: Secondary | ICD-10-CM | POA: Diagnosis present

## 2014-01-03 DIAGNOSIS — G929 Unspecified toxic encephalopathy: Secondary | ICD-10-CM

## 2014-01-03 LAB — CBC
HCT: 25.1 % — ABNORMAL LOW (ref 36.0–46.0)
Hemoglobin: 8.1 g/dL — ABNORMAL LOW (ref 12.0–15.0)
MCH: 29.2 pg (ref 26.0–34.0)
MCHC: 32.3 g/dL (ref 30.0–36.0)
MCV: 90.6 fL (ref 78.0–100.0)
Platelets: 212 10*3/uL (ref 150–400)
RBC: 2.77 MIL/uL — AB (ref 3.87–5.11)
RDW: 15.4 % (ref 11.5–15.5)
WBC: 8.3 10*3/uL (ref 4.0–10.5)

## 2014-01-03 LAB — BASIC METABOLIC PANEL
BUN: 19 mg/dL (ref 6–23)
CO2: 26 meq/L (ref 19–32)
CREATININE: 1.71 mg/dL — AB (ref 0.50–1.10)
Calcium: 9.9 mg/dL (ref 8.4–10.5)
Chloride: 98 mEq/L (ref 96–112)
GFR calc Af Amer: 35 mL/min — ABNORMAL LOW (ref >90)
GFR calc non Af Amer: 30 mL/min — ABNORMAL LOW (ref >90)
Glucose, Bld: 119 mg/dL — ABNORMAL HIGH (ref 70–99)
Potassium: 3.2 mEq/L — ABNORMAL LOW (ref 3.7–5.3)
Sodium: 140 mEq/L (ref 137–147)

## 2014-01-03 LAB — CULTURE, BLOOD (ROUTINE X 2)
CULTURE: NO GROWTH
Culture: NO GROWTH

## 2014-01-03 LAB — LIPID PANEL
CHOL/HDL RATIO: 1.9 ratio
Cholesterol: 142 mg/dL (ref 0–200)
HDL: 76 mg/dL (ref >39)
LDL CALC: 47 mg/dL (ref 0–99)
Triglycerides: 93 mg/dL (ref <150)
VLDL: 19 mg/dL (ref 0–40)

## 2014-01-03 LAB — MAGNESIUM: Magnesium: 1.7 mg/dL (ref 1.5–2.5)

## 2014-01-03 LAB — PHOSPHORUS: Phosphorus: 2.9 mg/dL (ref 2.3–4.6)

## 2014-01-03 LAB — PROTIME-INR
INR: 1.27 (ref 0.00–1.49)
Prothrombin Time: 15.6 seconds — ABNORMAL HIGH (ref 11.6–15.2)

## 2014-01-03 LAB — HEPARIN LEVEL (UNFRACTIONATED): Heparin Unfractionated: 0.31 IU/mL (ref 0.30–0.70)

## 2014-01-03 MED ORDER — ZOLPIDEM TARTRATE 5 MG PO TABS
5.0000 mg | ORAL_TABLET | Freq: Once | ORAL | Status: AC
  Administered 2014-01-03: 5 mg via ORAL
  Filled 2014-01-03: qty 1

## 2014-01-03 MED ORDER — SODIUM CHLORIDE 0.9 % IV SOLN: 250.0000 mL | INTRAVENOUS | Status: DC | PRN

## 2014-01-03 MED ORDER — CARVEDILOL 12.5 MG PO TABS
12.5000 mg | ORAL_TABLET | Freq: Two times a day (BID) | ORAL | Status: DC
  Administered 2014-01-03 – 2014-01-05 (×4): 12.5 mg via ORAL
  Filled 2014-01-03 (×8): qty 1

## 2014-01-03 MED ORDER — ONDANSETRON HCL 4 MG/2ML IJ SOLN
4.0000 mg | Freq: Four times a day (QID) | INTRAMUSCULAR | Status: DC | PRN
  Administered 2014-01-03 – 2014-01-05 (×2): 4 mg via INTRAVENOUS
  Filled 2014-01-03 (×2): qty 2

## 2014-01-03 MED ORDER — POTASSIUM CHLORIDE 10 MEQ/100ML IV SOLN
10.0000 meq | INTRAVENOUS | Status: AC
  Administered 2014-01-03 (×3): 10 meq via INTRAVENOUS
  Filled 2014-01-03 (×3): qty 100

## 2014-01-03 MED ORDER — LABETALOL HCL 100 MG PO TABS
100.0000 mg | ORAL_TABLET | Freq: Two times a day (BID) | ORAL | Status: DC
  Filled 2014-01-03: qty 1

## 2014-01-03 MED ORDER — MAGNESIUM SULFATE 40 MG/ML IJ SOLN
2.0000 g | Freq: Once | INTRAMUSCULAR | Status: AC
  Administered 2014-01-03: 2 g via INTRAVENOUS
  Filled 2014-01-03: qty 50

## 2014-01-03 NOTE — Progress Notes (Signed)
Trach changed to 4 cuffless. She is able to phonate without difficulty. Her breathing is low but noisy but she is moving air well and not complaining and in no distress. Sats 100%. Will continue with the #4, but we can upsized to #6 if she has any difficulty.

## 2014-01-03 NOTE — Progress Notes (Signed)
Speech Language Pathology Treatment: Dysphagia;Passy Muir Speaking valve  Patient Details Name: Sarah Tran MRN: [id number] DOB: 29-Jul-1945 Today's Date: 01/03/2014 Time: 5110-2111 SLP Time Calculation (min): 21 min  Assessment / Plan / Recommendation Clinical Impression  Pt seen with PMSV in place, consuming breakfast. Pt just had trach changed to a cuffless #4. Tolerance of PMSV still excellent, reduced breath support is related to baseline shortness of breath. Pt is able to obtain very good volume with increased effort and independent phrase chunking technique, as observed during phone conversation. Pt verbalized precautions and demonstrated independent placement of PMSV with use of mirror and without. Pts brother also present for education. Will f/u again during admit for any further needs, but pt is ready for independence at home from SLP standpoint.    HPI HPI: 68 yo female adm to [facility] with respiratory distress requiring s/p trach placement Friday 01/04/2014.  Pt for PMSV and swallow evaluation per CCS. Pt has a prior history of trach, but was evalauted for dysphagia by LTACH and CIR, started on dys 3 diet and thinl iquids and upgraded to regular, which pt has been toelratin at home.   Per ENT- hopeful get her downsized to a #4 and use Passey -Muir valve for  communication. CT scan to be repeated in 6 weeks to see if the partial thyroidectomy will help open the tracheal lumen per ENT.  And ENT hopes for decannulation in long term.  Pt does not recall using PMSV or having swallow evalution in xray previously.     Pertinent Vitals NA  SLP Plan  Continue with current plan of care    Recommendations Diet recommendations: Regular;Thin liquid Liquids provided via: Cup;Straw Medication Administration: Whole meds with liquid Supervision: Patient able to self feed;Intermittent supervision to cue for compensatory strategies Compensations: Slow rate;Small sips/bites Postural Changes and/or Swallow  Maneuvers: Seated upright 90 degrees      Patient may use Passy-Muir Speech Valve: During all waking hours (remove during sleep) PMSV Supervision: Intermittent       Follow up Recommendations: 24 hour supervision/assistance Plan: Continue with current plan of care    GO    [facility], MA CCC-SLP [phone]  Claudine Mouton 01/03/2014, 10:00 AM

## 2014-01-03 NOTE — Progress Notes (Signed)
[place] TEAM 1 - Stepdown/ICU TEAM Progress Note  Sarah Tran AVW:[id number] DOB: [birth year] DOA: 12/19/2013 PCP: Talmage Coin, MD  Admit HPI / Brief Narrative: 68 yo BF PMHx hypertension, renal insufficiency, obstructive sleep apnea with CPAP, DVT/A. fib with chronic Coumadin. admitted to [facility] on September 28, 2013, for removal of ileal polyp. Noted perioperative complications, balloon catheter became dislodged. She required exploratory laparotomy and diverting colostomy as well as appendectomy secondary to perforated sigmoid colon. Hospital course complicated by respiratory failure, requiring intubation, eventually tracheostomy as well as gastrostomy tube for nutritional support. She remained in intensive care for 28 days. The patient developed sepsis, cultures  growing Gram-positive cocci in clusters, maintained on broad-spectrum antibiotics. Patient stabilized, transferred to [facility] on October 25, 2013, for further vent weaning and wound care. An  echocardiogram completed on October 28, 2013, showing mild left ventricular hypertrophy with ejection fraction 50-60%. 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 Hospital course complicated by acute renal insufficiency, creatinine 2.51, atrial fibrillation with RVR question angioedema from Invanz reaction and wound dehiscence. The patient's tracheostomy tube downsized, later decannulated on Nov 18, 2013. The patient did develop some respiratory  stridor, placed on prednisone taper. Maintained on a mechanical soft diet. Patient with chronic anemia, hemoglobin varying 8.4 to 8.9. Subcutaneous heparin for DVT prophylaxis. The patient's chronic  Coumadin had been resumed on Nov 22, 2013, The patient was admitted for comprehensive rehab program. On 5/12 Patient admitted to [facility], discharged 6/1. Went to [facility] 6/16 for  respiratory distress, CXR suggestive of PNA. Transferred to [facility] via helicopter for concerns that she may need emergent bedside trach/airway.      HPI/Subjective: 6/23 Trach changed to 4 cuffless. She is able to phonate without difficult. Patient negative CP, negative SOB, positive anxiety about returning home secondary to her prolonged hospitalization and complications.    Assessment/Plan:  Acute respiratory failure -Resolved will monitor patient overnight and if still stable consider discharge on 6/24 - Dr. Brynda Peon (ENT) changed trach on 6/23 to 4 cuffless. -Trach bleeding resolved.  -ENT following (Dr Lazarus Salines)  -Cont ATC as tol  -Albuterol PRN  -pulm hygiene  -Intermittent f/u CXR  -ST cont to follow for PMV  -Needs referral to academic facility for thyroidectomy and possible tracheal reconstruction; would defer to ENT if still required  HTN  -Restart patient's Coreg at a lower dose 12.5 mg BID (home dose 25 mgBID) -Continue clonidine transdermal 0.2 mg -Continue hydralazine IV 10 mg q 6hr PRN   Dilated cardiomyopathy -See HTN  Dyslipidemia  -Obtain lipid panel  Hx AFib - now NSR , continue to monitor -Continue heparin drip -On 6/24 restart Coumadin -If stable Transfer to floor in Am  Hx DVT  Continue heparin drip, restart Coumadin 6/24 if trach site stable overnight  Acute on chronic kidney failure - improving  -Start gentle hydration normal saline 50 ml/hr   Hypomagnesemia  -Mg Goal> 2 -Mg IV 2gm  Hypokalemia Potassium goal>4 -Potassium IV 10 mEq x 3 runs   AF / DVT -On chronic anticoagulation; subtherapeutic on transfer to  -Continue heparin drip, if stable on 6/24 consider restarting Coumadin  Trach site bleeding  - resolved    Hyperthyroidism  -Continue methimazole 5 mg daily imazole as preadmission when able  -Will need referral for thyroidectomy; defer to ENT       Code Status: FULL Family Communication: no  family present at  time of exam Disposition Plan: Next 48-72 hours    Consultants: Dr. Levy Pupa (PCCM) Dr. Beverlee Nims (ENT)   Procedure/Significant Events: 4/17 echocardiogram; LVEF= 55% -60% ; Left atrium: severely dilated. 6/17 Laryngoscopy by ENT >>> Normal vocal cords, tracheomalacia / stenosis  6/19 Tracheostomy by ENT  6/20 Failed swallow eval NOTE: On 6/22 past swallow eval    Culture Blood 6/16 >>>  Urine 6/16 >>> neg   Antibiotics: Levaquin 6/16 >>> 6/17  Vancomycin 6/16 >>> 6/17   DVT prophylaxis: Heparin drip   Devices #4 Trach Cuffless  LINES / TUBES:  Foley 6/16 >>>  Trach (ENT) 6/19 >>>     Continuous Infusions: . dextrose 5 % and 0.45% NaCl 40 mL/hr at 01/03/14 1241  . heparin 1,250 Units/hr (01/02/14 0823)    Objective: VITAL SIGNS: Temp: 98.7 F (37.1 C) (06/23 1616) Temp src: Oral (06/23 1930) BP: 148/63 mmHg (06/23 1806) Pulse Rate: 120 (06/23 2003) SPO2; 99% on trach collar FIO2: 5 L per minute  No intake or output data in the 24 hours ending 01/03/14 2023   Exam: General: A/O x 4 , NAD, No acute respiratory distress Lungs: Clear to auscultation bilaterally without wheezes or crackles Cardiovascular: Regular rate and rhythm without murmur gallop or rub normal S1 and S2 Abdomen: Nontender, nondistended, soft, bowel sounds positive, no rebound, no ascites, no appreciable mass, colostomy bag in place negative sign of infection Extremities: No significant cyanosis, clubbing, or edema bilateral lower extremities  Data Reviewed: Basic Metabolic Panel:  Recent Labs Lab 01/10/2014 2246 12/28/13 0227 12/29/13 0242 01/01/2014 0353 12/31/13 0235 01/01/14 0320 01/03/14 0338  NA 141 139 139 134* 139 140 140  K 4.1 4.7 4.5 3.8 4.0 3.9 3.2*  CL 101 103 101 96 101 102 98  CO2 23 24 25 25 27 23 26   GLUCOSE 97 119* 131* 105* 117* 104* 119*  BUN 33* 32* 34* 30* 26* 23 19  CREATININE 2.22* 2.17* 2.33* 2.16* 1.84* 1.78* 1.71*  CALCIUM 9.2 9.1 9.1  9.2 9.3 9.3 9.9  MG 1.6 1.6 2.0  --   --  1.8 1.7  PHOS 3.9 3.7  --   --   --  3.6 2.9   Liver Function Tests: No results found for this basename: AST, ALT, ALKPHOS, BILITOT, PROT, ALBUMIN,  in the last 168 hours No results found for this basename: LIPASE, AMYLASE,  in the last 168 hours No results found for this basename: AMMONIA,  in the last 168 hours CBC:  Recent Labs Lab 12/24/2013 0353 12/31/13 0235 01/01/14 0320 01/02/14 0200 01/03/14 0338  WBC 4.1 6.9 7.4 7.5 8.3  HGB 7.9* 8.8* 8.3* 7.6* 8.1*  HCT 23.7* 26.9* 25.1* 23.5* 25.1*  MCV 89.1 90.0 90.6 90.7 90.6  PLT 192 189 181 168 212   Cardiac Enzymes: No results found for this basename: CKTOTAL, CKMB, CKMBINDEX, TROPONINI,  in the last 168 hours BNP (last 3 results)  Recent Labs  01/04/2014 2246  PROBNP 838.6*   CBG:  Recent Labs Lab 12/15/2013 0944  GLUCAP 111*    Recent Results (from the past 240 hour(s))  MRSA PCR SCREENING     Status: None   Collection Time    12/22/2013  6:17 PM      Result Value Ref Range Status   MRSA by PCR NEGATIVE  NEGATIVE Final   Comment:            The GeneXpert MRSA Assay (FDA  approved for NASAL specimens     only), is one component of a     comprehensive MRSA colonization     surveillance program. It is not     intended to diagnose MRSA     infection nor to guide or     monitor treatment for     MRSA infections.  URINE CULTURE     Status: None   Collection Time    12/31/2013  6:17 PM      Result Value Ref Range Status   Specimen Description URINE, CATHETERIZED   Final   Special Requests ADDED [id number]   Final   Culture  Setup Time     Final   Value: 12/28/2013 08:41     Performed at [facility]   Colony Count     Final   Value: NO GROWTH     Performed at [facility]   Culture     Final   Value: NO GROWTH     Performed at [facility]   Report Status 12/29/2013 FINAL   Final  CULTURE, BLOOD (ROUTINE X 2)     Status: None   Collection  Time    01/03/2014 10:46 PM      Result Value Ref Range Status   Specimen Description BLOOD LEFT HAND   Final   Special Requests BOTTLES DRAWN AEROBIC ONLY [facility]6CC   Final   Culture  Setup Time     Final   Value: 12/28/2013 08:39     Performed at [facility]   Culture     Final   Value: NO GROWTH 5 DAYS     Performed at [facility]   Report Status 01/03/2014 FINAL   Final  CULTURE, BLOOD (ROUTINE X 2)     Status: None   Collection Time    12/16/2013 10:52 PM      Result Value Ref Range Status   Specimen Description BLOOD RIGHT HAND   Final   Special Requests BOTTLES DRAWN AEROBIC ONLY 1CC   Final   Culture  Setup Time     Final   Value: 12/28/2013 08:39     Performed at [facility]   Culture     Final   Value: NO GROWTH 5 DAYS     Performed at [facility]   Report Status 01/03/2014 FINAL   Final     Studies:  Recent x-ray studies have been reviewed in detail by the Attending Physician  Scheduled Meds:  Scheduled Meds: . antiseptic oral rinse  15 mL Mouth Rinse q12n4p  . brimonidine  1 drop Both Eyes BID  . carvedilol  12.5 mg Oral BID WC  . chlorhexidine  15 mL Mouth Rinse BID  . cholecalciferol  400 Units Oral Daily  . [START ON 01/08/2014] cloNIDine  0.2 mg Transdermal Weekly  . darbepoetin  40 mcg Subcutaneous Q Sat-1800  . ferrous sulfate  325 mg Oral TID WC  . hydrALAZINE  10 mg Intravenous 4 times per day  . magnesium sulfate 1 - 4 g bolus IVPB  2 g Intravenous Once  . methimazole  5 mg Oral Daily  . nitroGLYCERIN  0.2 mg Transdermal Daily  . pantoprazole (PROTONIX) IV  40 mg Intravenous QHS  . potassium chloride  10 mEq Intravenous Q1 Hr x 3  . scopolamine  1 patch Transdermal Q72H  . sodium bicarbonate  1,300 mg Oral BID  . timolol  1 drop Both  Eyes BID  . zinc sulfate  220 mg Oral Daily    Time spent on care of this patient: 40 mins   Drema Dallas , MD   [facility] Office  [phone] Pager [phone]  On-Call/Text Page:      [email or web address]      password [email or web address]  If 7PM-7AM, please contact night-coverage [email or web address] Password [facility] 01/03/2014, 8:23 PM   LOS: 7 days

## 2014-01-03 NOTE — Progress Notes (Signed)
MD changed trach out from # 6 shiley cuffed to #4 cuffless shiley.  ETCO2 postitive with equal clear breath sounds.  Vital signs and oxgen stable throughout, will continue to monitor.

## 2014-01-03 NOTE — Progress Notes (Signed)
ANTICOAGULATION CONSULT NOTE  Pharmacy Consult for heparin Indication: atrial fibrillation/DVT   Allergies  Allergen Reactions  . Codeine Other (See Comments)    Pass out    . Ertapenem     From  Med list from [facility]   . Fish-Derived Products     From med list from [facility]  . Peanut Butter Flavor Hives and Other (See Comments)    Lockjaw   . Penicillins Swelling    Patient Measurements: Height: 5\' 2"  (157.5 cm) Weight: 235 lb 0.2 oz (106.6 kg) IBW/kg (Calculated) : 50.1 Heparin dosing weight: 77 kg  Vital Signs: Temp: 98.5 F (36.9 C) (06/23 1151) Temp src: Oral (06/23 1151) BP: 131/62 mmHg (06/23 1151) Pulse Rate: 88 (06/23 1151) Intake/Output from previous day: 06/22 0701 - 06/23 0700 In: 261.8 [I.V.:261.8] Out: -  Intake/Output from this shift:     Labs:  Recent Labs  01/10/2014 2246  HGB 7.1*  HCT 22.2*  PLT 167     Recent Labs  01/01/14 0320 01/02/14 0200 01/03/14 0338  WBC 7.4 7.5 8.3  HGB 8.3* 7.6* 8.1*  PLT 181 168 212  CREATININE 1.78*  --  1.71*   Estimated Creatinine Clearance: 37.1 ml/min (by C-G formula based on Cr of 1.71).  Assessment: 50 YOF with recent prolonged hospitalization at [facility] including trach. On chronic anticoagulation for h/o DVT and pAFib.  Pt received warfarin x1 dose on 6/16 and was subsequently held due to possible need to go to the OR. Heparin drip started for bridging - held 6/19-6/20 for trach placement and bleeding around the trach site.   Heparin level this morning remains therapeutic (HL 0.31, goal of 0.3-0.7), Hgb/Hct low but stable, plts wnl. Pink tinged secretion noted on guaze around trach site - will monitor closely - no other s/sx of bleeding noted.   Goal of Therapy:  Heparin level: 0.3-0.7 Monitor platelets while on anticoagulation: Yes  Plan:  1. Continue heparin at 1250 units/hr (12.5 ml/hr) 2. Will f/u plans to start warfarin now that the patient is taking  orals. 3. Will continue to monitor for any signs/symptoms of bleeding and will follow up with heparin level in the a.m.   Georgina Pillion, PharmD, BCPS Clinical Pharmacist Pager: [phone] 01/03/2014 1:47 PM

## 2014-01-03 NOTE — Evaluation (Signed)
Occupational Therapy Evaluation Patient Details Name: Sarah Tran MRN: [id number] DOB: 1946-05-09 Today's Date: 01/03/2014    History of Present Illness Patient is a 68 yo female admitted 2014-01-21 with resp distress, HCAP, and is s/p trach revision 01/05/2014.  Patient with PMH of recent prolonged hospitalization including tracheostomy, Afib, HTN, DM.   Clinical Impression   PT admitted with respiratory distress. Pt currently with functional limitiations due to the deficits listed below (see OT problem list).  Pt will benefit from skilled OT to increase their independence and safety with adls and balance to allow discharge HHOT. OT to follow acutely to help with adl retraining , energy conservation and HEP bil UE .     Follow Up Recommendations  Home health OT;Supervision/Assistance - 24 hour    Equipment Recommendations  Hospital bed (question need for hospital bed to help with mobility)    Recommendations for Other Services       Precautions / Restrictions Precautions Precautions: Fall Precaution Comments: Trach Restrictions Weight Bearing Restrictions: No      Mobility Bed Mobility Overal bed mobility: Needs Assistance Bed Mobility: Supine to Sit;Sit to Supine     Supine to sit: Min assist;HOB elevated Sit to supine: Mod assist;HOB elevated   General bed mobility comments: Pt requires (A) to advance BIL LE due to edema present. Pt pullign on bed rails and requires hOB elevated.. Pt does not have hospital bed at home. pt could benefit from elevated HOB  Transfers Overall transfer level: Needs assistance Equipment used: 1 person hand held assist Transfers: Sit to/from [employer] Sit to Stand: Min assist Stand pivot transfers: Mod assist            Balance                                            ADL Overall ADL's : Needs assistance/impaired Eating/Feeding: Set up;Sitting   Grooming: Wash/dry face;Set up;Sitting                   Toilet Transfer: Moderate assistance;Stand-pivot;BSC Toilet Transfer Details (indicate cue type and reason): declined use of RW. Requesting brother help. Total +2 min (A) to 3n1 and then mod (A) back to bed.  Toileting- Clothing Manipulation and Hygiene: Total assistance;Sit to/from stand Toileting - Clothing Manipulation Details (indicate cue type and reason): unable perform any peri care       General ADL Comments: Pt agreaeable to OOB to 3n1. Pt with elevated HR 160 and unaware without symptoms. Pt required cues to take rest breaks and decr RR and HR. Pt has strong family support for d/c home. pt very limited in mobility due to incr HR     Vision                     Perception     Praxis      Pertinent Vitals/Pain HR 160 with static standing     Hand Dominance Right   Extremity/Trunk Assessment Upper Extremity Assessment Upper Extremity Assessment: Generalized weakness   Lower Extremity Assessment Lower Extremity Assessment: Defer to PT evaluation   Cervical / Trunk Assessment Cervical / Trunk Assessment: Normal   Communication Communication Communication: Tracheostomy;Passy-Muir valve   Cognition Arousal/Alertness: Awake/alert Behavior During Therapy: WFL for tasks assessed/performed Overall Cognitive Status: Within Functional Limits for tasks assessed  General Comments       Exercises       Shoulder Instructions      Home Living Family/patient expects to be discharged to:: Private residence Living Arrangements: Children Available Help at Discharge: Family;Available 24 hours/day Type of Home: House Home Access: Ramped entrance Entrance Stairs-Number of Steps: 4 Entrance Stairs-Rails: None Home Layout: One level     Bathroom Shower/Tub: Other (comment) (sponge bath at bed with basin or sinks at sink)   FirefighterBathroom Toilet: Standard (uses 3n1 over ) Bathroom Accessibility: Yes How Accessible: Accessible  via walker Home Equipment: Walker - 2 wheels;Wheelchair - [employer]manual;Bedside commode;Shower seat      Lives With: Daughter;Other (Comment)    Prior Functioning/Environment Level of Independence: Independent with assistive device(s);Needs assistance  Gait / Transfers Assistance Needed: "Sometimes" needs help with transfers to w/c ADL's / Homemaking Assistance Needed: Assist for bathing, dressing, meal prep, housekeeping.        OT Diagnosis: Generalized weakness   OT Problem List: Decreased strength;Decreased activity tolerance;Impaired balance (sitting and/or standing);Decreased safety awareness;Decreased knowledge of use of DME or AE;Decreased knowledge of precautions;Pain;Obesity;Cardiopulmonary status limiting activity   OT Treatment/Interventions: Self-care/ADL training;Therapeutic exercise;DME and/or AE instruction;Therapeutic activities;Patient/family education;Balance training    OT Goals(Current goals can be found in the care plan section) Acute Rehab OT Goals Patient Stated Goal: To return home OT Goal Formulation: With patient/family Time For Goal Achievement: 01/17/14 Potential to Achieve Goals: Good  OT Frequency: Min 2X/week   Barriers to D/C:            Co-evaluation              End of Session Nurse Communication: Mobility status;Precautions  Activity Tolerance: Treatment limited secondary to medical complications (Comment) (HR 160 ) Patient left: in bed;with family/visitor present;with call bell/phone within reach   Time: [id number] OT Time Calculation (min): 21 min Charges:  OT General Charges $OT Visit: 1 Procedure OT Evaluation $Initial OT Evaluation Tier I: 1 Procedure OT Treatments $Self Care/Home Management : 8-22 mins G-Codes:    Harolyn RutherfordJones, Jessica B 01/03/2014, 10:27 AM Pager: [phone]

## 2014-01-04 ENCOUNTER — Inpatient Hospital Stay (HOSPITAL_COMMUNITY): Payer: Medicare Other

## 2014-01-04 LAB — HEPARIN LEVEL (UNFRACTIONATED): Heparin Unfractionated: 0.31 IU/mL (ref 0.30–0.70)

## 2014-01-04 LAB — GLUCOSE, CAPILLARY
Glucose-Capillary: 138 mg/dL — ABNORMAL HIGH (ref 70–99)
Glucose-Capillary: 144 mg/dL — ABNORMAL HIGH (ref 70–99)
Glucose-Capillary: 131 mg/dL — ABNORMAL HIGH (ref 70–99)
Glucose-Capillary: 165 mg/dL — ABNORMAL HIGH (ref 70–99)
Glucose-Capillary: 143 mg/dL — ABNORMAL HIGH (ref 70–99)

## 2014-01-04 MED ORDER — POTASSIUM CHLORIDE CRYS ER 20 MEQ PO TBCR
40.0000 meq | EXTENDED_RELEASE_TABLET | Freq: Once | ORAL | Status: AC
  Administered 2014-01-04: 40 meq via ORAL
  Filled 2014-01-04: qty 2

## 2014-01-04 MED ORDER — SENNOSIDES-DOCUSATE SODIUM 8.6-50 MG PO TABS
1.0000 | ORAL_TABLET | Freq: Two times a day (BID) | ORAL | Status: DC
  Administered 2014-01-04 – 2014-01-10 (×10): 1 via ORAL
  Filled 2014-01-04 (×13): qty 1

## 2014-01-04 MED ORDER — WARFARIN - PHARMACIST DOSING INPATIENT
Freq: Every day | Status: DC
  Administered 2014-01-06 – 2014-01-07 (×2)

## 2014-01-04 MED ORDER — WARFARIN SODIUM 2.5 MG PO TABS
2.5000 mg | ORAL_TABLET | Freq: Once | ORAL | Status: AC
  Administered 2014-01-04: 2.5 mg via ORAL
  Filled 2014-01-04: qty 1

## 2014-01-04 MED ORDER — SODIUM CHLORIDE 0.9 % IV SOLN: 250.0000 mL | INTRAVENOUS | Status: DC | PRN

## 2014-01-04 MED ORDER — ZOLPIDEM TARTRATE 5 MG PO TABS
5.0000 mg | ORAL_TABLET | Freq: Once | ORAL | Status: AC
  Administered 2014-01-04: 5 mg via ORAL
  Filled 2014-01-04: qty 1

## 2014-01-04 NOTE — Progress Notes (Signed)
Called to room by RT due to patient having increased WOB. BBS very decreased and chest xray confirmed trach in the wrong place. Removed the #4 trach and placed a # 6 shiley cuffed. Placement confirmed by CO2 detector and BBS equal. Patient feels much better and CCM doctor present.

## 2014-01-04 NOTE — Progress Notes (Signed)
ANTICOAGULATION CONSULT NOTE - Follow Up Consult  Pharmacy Consult for coumadin Indication: atrial fibrillation  Allergies  Allergen Reactions  . Codeine Other (See Comments)    Pass out    . Ertapenem     From  Med list from [facility]   . Fish-Derived Products     From med list from [facility]  . Peanut Butter Flavor Hives and Other (See Comments)    Lockjaw   . Penicillins Swelling    Patient Measurements: Height: 5\' 2"  (157.5 cm) Weight: 233 lb 7.5 oz (105.9 kg) IBW/kg (Calculated) : 50.1 Heparin Dosing Weight:   Vital Signs: Temp: 97.6 F (36.4 C) (06/24 1559) Temp src: Oral (06/24 1559) BP: 121/52 mmHg (06/24 1725) Pulse Rate: 86 (06/24 1717)  Labs:  Recent Labs  01/02/14 0200 01/03/14 0338 01/03/14 2050 01/04/14 0320  HGB 7.6* 8.1*  --   --   HCT 23.5* 25.1*  --   --   PLT 168 212  --   --   LABPROT  --   --  15.6*  --   INR  --   --  1.27  --   HEPARINUNFRC 0.31 0.31  --  0.31  CREATININE  --  1.71*  --   --     Estimated Creatinine Clearance: 37 ml/min (by C-G formula based on Cr of 1.71).   Medications:  Scheduled:  . antiseptic oral rinse  15 mL Mouth Rinse q12n4p  . brimonidine  1 drop Both Eyes BID  . carvedilol  12.5 mg Oral BID WC  . chlorhexidine  15 mL Mouth Rinse BID  . cholecalciferol  400 Units Oral Daily  . [START ON 01/08/2014] cloNIDine  0.2 mg Transdermal Weekly  . darbepoetin  40 mcg Subcutaneous Q Sat-1800  . ferrous sulfate  325 mg Oral TID WC  . hydrALAZINE  10 mg Intravenous 4 times per day  . methimazole  5 mg Oral Daily  . nitroGLYCERIN  0.2 mg Transdermal Daily  . pantoprazole (PROTONIX) IV  40 mg Intravenous QHS  . scopolamine  1 patch Transdermal Q72H  . senna-docusate  1 tablet Oral BID  . sodium bicarbonate  1,300 mg Oral BID  . timolol  1 drop Both Eyes BID  . zinc sulfate  220 mg Oral Daily   Infusions:  . heparin 1,250 Units/hr (01/04/14 0334)    Assessment: 68 yo female  with afib will be resumed on coumadin.  Patient is also currently on heparin.  INR on 01/03/14 was 1.27. Home coumadin dose was 2.5 mg daily.  Last dose was past week.  Goal of Therapy:  INR 2-3 Monitor platelets by anticoagulation protocol: Yes   Plan:  1) Coumadin 2.5 mg po x1 2) Daily PT/INR  So, Tsz-Yin 01/04/2014,6:58 PM

## 2014-01-04 NOTE — Progress Notes (Signed)
PULMONARY / CRITICAL CARE MEDICINE  Name: Sarah Tran MRN: [id number] DOB: February 28, 1946    ADMISSION DATE:  01-20-14 CONSULTATION DATE:  Jan 20, 2014   REFERRING MD :  EDP PRIMARY SERVICE: PCCM  CHIEF COMPLAINT:  Respiratory Distress  BRIEF PATIENT DESCRIPTION: 68 yo with recent prolonged hospitalization including tracheostomy, ultimately decannulated and d/c 6/1. Returned to OSH 6/16 for respiratory distress. CXR suggestive of PNA.  Transferred to [facility] concerns that she may need emergent bedside trach / airway. PCCM was consulted as primary.  SIGNIFICANT EVENTS / STUDIES:   6/17  Laryngoscopy by ENT >>> Normal vocal cords, tracheomalacia / stenosis 6/19 Tracheostomy by ENT 6/20  Failed swallow eval   LINES / TUBES: Foley 6/16 >>> Trach (ENT) 6/19 >>>  CULTURES: Blood 6/16 >>> Urine 6/16 >>> neg  ANTIBIOTICS: Levaquin 6/16 >>> 6/17 Vancomycin 6/16 >>> 6/17  INTERVIEW HISTORY:  Failed swallow eval, supposed to be re-evaluated today  VITAL SIGNS: Temp:  [97.8 F (36.6 C)-98.8 F (37.1 C)] 97.8 F (36.6 C) (06/24 0801) Pulse Rate:  [82-120] 92 (06/24 0911) Resp:  [12-23] 23 (06/24 0911) BP: (104-161)/(51-86) 161/70 mmHg (06/24 0911) SpO2:  [93 %-100 %] 97 % (06/24 0911) FiO2 (%):  [28 %] 28 % (06/24 0911) Weight:  [233 lb 7.5 oz (105.9 kg)] 233 lb 7.5 oz (105.9 kg) (06/24 0500)  HEMODYNAMICS:   VENTILATOR SETTINGS: Vent Mode:  [-]  FiO2 (%):  [28 %] 28 %  INTAKE / OUTPUT: Intake/Output     06/23 0701 - 06/24 0700 06/24 0701 - 06/25 0700   I.V. (mL/kg) 2257.5 (21.3) 105 (1)   IV Piggyback 150    Total Intake(mL/kg) 2407.5 (22.7) 105 (1)   Urine (mL/kg/hr) 175 (0.1)    Total Output 175     Net +2232.5 +105         PHYSICAL EXAMINATION: General: Resting in no distress Neuro: Awake, alert, communicates  HEENT: mm moist, no JVD, no bleeding at trach site noted  Cardiovascular: Regular, no murmurs Lungs: resps even non labored on ATC, CTAB Abdomen: Obese, soft,  colostomy  Musculoskeletal: Trace edema Skin: Intact  LABS:  CBC  Recent Labs Lab 01/01/14 0320 01/02/14 0200 01/03/14 0338  WBC 7.4 7.5 8.3  HGB 8.3* 7.6* 8.1*  HCT 25.1* 23.5* 25.1*  PLT 181 168 212   Coag's  Recent Labs Lab 01/03/14 2050  INR 1.27   BMET  Recent Labs Lab 12/31/13 0235 01/01/14 0320 01/03/14 0338  NA 139 140 140  K 4.0 3.9 3.2*  CL 101 102 98  CO2 27 23 26   BUN 26* 23 19  CREATININE 1.84* 1.78* 1.71*  GLUCOSE 117* 104* 119*   Electrolytes  Recent Labs Lab 12/29/13 0242  12/31/13 0235 01/01/14 0320 01/03/14 0338  CALCIUM 9.1  < > 9.3 9.3 9.9  MG 2.0  --   --  1.8 1.7  PHOS  --   --   --  3.6 2.9  < > = values in this interval not displayed. Sepsis Markers No results found for this basename: LATICACIDVEN, PROCALCITON, O2SATVEN,  in the last 168 hours ABG No results found for this basename: PHART, PCO2ART, PO2ART,  in the last 168 hours Liver Enzymes No results found for this basename: AST, ALT, ALKPHOS, BILITOT, ALBUMIN,  in the last 168 hours  Cardiac Enzymes No results found for this basename: TROPONINI, PROBNP,  in the last 168 hours Glucose  Recent Labs Lab 01/10/2014 0944 01/04/14 0536 01/04/14 0800  GLUCAP 111* 138* 144*  IMAGING:   Dg Chest Port 1 View  01/04/2014   ADDENDUM REPORT: 01/04/2014 01:11  ADDENDUM: The original report was by Dr. Gaylyn Rong. The following addendum is by Dr. Gaylyn Rong:  These results were called by telephone at the time of interpretation on 01/04/2014 at 1:11 AM to K. Schorr, who verbally acknowledged these results.   Electronically Signed   By: Herbie Baltimore M.D.   On: 01/04/2014 01:11   01/04/2014   CLINICAL DATA:  Tracheostomy tube placement.  EXAM: PORTABLE CHEST - 1 VIEW  COMPARISON:  01/03/2014  FINDINGS: The tracheostomy tube projects over the tracheal air column.  There is subcutaneous emphysema along the right neck and adjacent shoulder. There is also pneumomediastinum  which appears new compared to the prior exam.  A retrocardiac hiatal hernia is present. Mild perihilar airspace opacities. Mild cardiomegaly.  IMPRESSION: 1. New pneumomediastinum. 2. Mild cardiomegaly, with perihilar densities potentially reflecting early pulmonary edema or atelectasis. 3. Hiatal hernia. 4. Subcutaneous emphysema in the right neck.  Electronically Signed: By: Herbie Baltimore M.D. On: 01/04/2014 00:57   Dg Chest Port 1 View  01/03/2014   CLINICAL DATA:  Tracheostomy.  Short of breath.  EXAM: PORTABLE CHEST - 1 VIEW  COMPARISON:  12/28/2013.  FINDINGS: Cardiopericardial silhouette within normal limits for volumes of inspiration. Tracheostomy present, unchanged. Monitoring leads project over the chest. No airspace disease. No effusion. Resolution of left basilar atelectasis.  IMPRESSION: 1. Unchanged tracheostomy. 2. No active cardiopulmonary disease. Resolution of left basilar atelectasis.   Electronically Signed   By: Andreas Newport M.D.   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  Did not tol trial PMV.  Trach site bleeding - resolved.  P:   -ENT following (Dr Lazarus Salines) -Cont ATC as tol  -Albuterol PRN -pulm hygiene  -Intermittent f/u CXR  -ST cont to follow for PMV -Needs referral to academic facility for thyroidectomy and possible tracheal reconstruction   CARDIOVASCULAR A:  HTN Dyslipidemia  Hx AFib - now NSR Hx DVT  P:  -Goal BP<160/90 -Amlodipine, Atorvastatin, Carvedilol when able to take POs -Added clonidine patch 6/21, increase 6/22 -Labetalol PRN -Added hydralazine scheduled 6/22 until he can take PO  RENAL A:   Acute on chronic kidney failure - improving  Hypomagnesemia  P:   -Trend BMP -Cont hold lasix, consider gentle restart 6/23 -F/u mg, phos intermittently  -Cont bicarb tabs when taking  PO's -Gentle IVF while not taking PO's/no TF  GASTROINTESTINAL A:   Nutrition GERD Colostomy status Dysphagia - hx PEG.  Did not tol PMV, difficulty swallowing oral secretions at times. ?if this will improve as post op swelling subsides.  P:   -NPO -SLP cont to follow  -Will need MBS - ST wants to wait until she tol PMV -Pt very hesitant to have NG attempted again (4 attempts overnight) - agreed to have ST re-eval 6/22.  At that time if she cannot take PO's will need IR feeding tube.   HEMATOLOGIC A:  Anemia of chronic disease Chronic anticoagulation for AF / DVT - subtherapeutic on transfer Trach site bleeding - resolved P:  -Trend CBC -Continue heparin gtt and monitor for bleeding  -If cont to tol heparin gtt with no further bleeding resume coumadin when able to take PO's -Cont aransep, iron   INFECTIOUS A:   HCAP unlikely P:   -  Observe off abx  ENDOCRINE A:   Hyperthyroidism  P:   -Methimazole as preadmission when able -See above - needs referral for thyroidectomy prior to d/c   NEUROLOGIC A:   Pain Deconditioning P:   -Fentanyl PRN -PT / OT  Has SDU orders.  [facility] to assume care 6/22, PCCM signing off, any tracheostomy concerns please call ENT.  *Care during the described time interval was provided by me and/or other providers on the critical care team. I have reviewed this patient's available data, including medical history, events of note, physical examination and test results as part of my evaluation.  Alyson ReedyWesam G. Yacoub, M.D. [facility] Medicine. Pager: [phone]. After hours pager: [phone].

## 2014-01-04 NOTE — Progress Notes (Signed)
[place] TEAM 1 - Stepdown/ICU TEAM Progress Note  Sarah Tran ZDG:[id number] DOB: [birth year] DOA: 12/26/2013 PCP: Talmage CoinKERR,Aylyn Wenzler, MD  Admit HPI / Brief Narrative: 68 y.o. F with multiple co-morbidities. She recently was [facility] in [facility] after she had a perforated sigmoid colon following colonoscopy at [facility]. During her prior [facility] hospitalization, she required ex lap and diverting colostomy as well as appendectomy. Hospital course was complicated by respiratory failure requiring intubation and eventual trach. She was transferred to [facility] followed by CIR for rehab to address deconditioning. Total hospital stay was roughly 3 months.  After CIR, she was discharged to her [place] in [place]. Per pt and her family, she had been doing well and was making some progress up until about 2 days prior to day of admit when she began to experience some SOB and decreased energy/activity level. During this time, she also began to have a cough that was non-productive.  Per family, pt had gone to a nephrology follow up appointment earlier in the day, and following appointment, began to feel very hot and more SOB. She was subsequently taken to the hospital where she was initially communicating/alert, but then became unresponsive. She had an episode of bradycardia down to 40's and SpO2 50%. BVM ventilation was initiated which improved SpO2; however, as soon as it was stopped, SpO2 dropped back down into 50's. She was subsequently placed on BiPAP with significant improvement. CXR obtained in the ED suggestive of RLL PNA.  Due to her hx of difficult airway including trach and the fact that there are no pulmonologist's or ENT physicians at [facility], decision was made to transfer [facility]. Due to long drive time (2 - 2.5 hrs), she was transported via helicopter.   HPI/Subjective: Pt had trach changed to #4 yesterday.  Later in night had possible blood clot in trach.  Ultimately it was  discovered that the #4 had become malpositioned.  It was removed and a #6 was placed by PCCM.  Her severe SOB resolved thereafter.  She has no new complaints today, but is anxious about trach dislodgement at home.    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 Brynda PeonJeffrey Rosen (ENT) changed trach on 6/23 to #4 cuffless shiley -trach found to be misplaced via CXR 6/24, requiring replacement, at which time a #6 shiley cuffless was used -ENT following - await further suggestions following events of last night  -f/u CXR in AM to reassess pneumomediastinum -ST cont to follow for PMV  -Needs referral to academic facility for thyroidectomy and possible tracheal reconstruction; defer to ENT if still required  Dysphagia -cleared for regular diet w/ thin liquids per SLP - tolerating well thus far   HTN  -BP well controlled at present time   Dilated cardiomyopathy -well compensated at this time   Dyslipidemia  -lipid panel favorable   Chronic AFib -now NSR, continue to monitor -transition heparin gtt to coumadin   Hx DVT  -Continue heparin drip, restart Coumadin   Acute on chronic kidney failure - improving  -crt improving - push oral intake and follow   Hypomagnesemia  -recheck Mg in  AM   Hypokalemia -continue to replace and follow   Hyperglycemia  -stop D5 - check A1c  Trach site bleeding  -resolved    Hyperthyroidism  -Continue methimazole    Code Status: FULL Family Communication: spoke w/ pt and daughter at bedside  Disposition Plan: d/c when trach issues deemed stable   Consultants: Dr. Levy Pupa  (PCCM) Dr. Beverlee Nims (ENT)  Procedure: 4/17 echocardiogram; LVEF= 55% -60% ; Left atrium: severely dilated.  Antibiotics: Levaquin 6/16 >>> 6/17  Vancomycin 6/16 >>> 6/17  DVT prophylaxis: Heparin drip >> warfarin  Objective: VITAL SIGNS: Blood pressure 118/51, pulse 75, temperature 98 F (36.7 C), temperature source Oral, resp. rate 18, height 5\' 2"  (1.575 m), weight 105.9 kg (233 lb 7.5 oz), SpO2 99.00%.   Intake/Output Summary (Last 24 hours) at 01/04/14 1434 Last data filed at 01/04/14 0900  Gross per 24 hour  Intake 2512.5 ml  Output    175 ml  Net 2337.5 ml   Exam: General: No acute respiratory distress Lungs: Clear to auscultation bilaterally without wheezes or crackles Cardiovascular: Regular rate without murmur gallop or rub normal S1 and S2 Abdomen: Nontender, nondistended, soft, bowel sounds positive, no rebound, no ascites, no appreciable mass, colostomy bag in place negative sign of infection Extremities: No significant cyanosis, clubbing, or edema bilateral lower extremities  Data Reviewed: Basic Metabolic Panel:  Recent Labs Lab 12/29/13 0242 12/21/2013 0353 12/31/13 0235 01/01/14 0320 01/03/14 0338  NA 139 134* 139 140 140  K 4.5 3.8 4.0 3.9 3.2*  CL 101 96 101 102 98  CO2 25 25 27 23 26   GLUCOSE 131* 105* 117* 104* 119*  BUN 34* 30* 26* 23 19  CREATININE 2.33* 2.16* 1.84* 1.78* 1.71*  CALCIUM 9.1 9.2 9.3 9.3 9.9  MG 2.0  --   --  1.8 1.7  PHOS  --   --   --  3.6 2.9   Liver Function Tests: No results found for this basename: AST, ALT, ALKPHOS, BILITOT, PROT, ALBUMIN,  in the last 168 hours No results found for this basename: LIPASE, AMYLASE,  in the last 168 hours No results found for this basename: AMMONIA,  in the last 168 hours  CBC:  Recent Labs Lab 12/19/2013 0353 12/31/13 0235 01/01/14 0320 01/02/14 0200 01/03/14 0338  WBC 4.1 6.9 7.4 7.5 8.3  HGB 7.9* 8.8* 8.3* 7.6* 8.1*  HCT 23.7* 26.9* 25.1* 23.5* 25.1*  MCV 89.1 90.0  90.6 90.7 90.6  PLT 192 189 181 168 212   CBG:  Recent Labs Lab 01/02/2014 0944 01/04/14 0536 01/04/14 0800 01/04/14 1227  GLUCAP 111* 138* 144* 131*    Recent Results (from the past 240 hour(s))  MRSA PCR SCREENING     Status: None   Collection Time    Jan 03, 2014  6:17 PM      Result Value Ref Range Status   MRSA by PCR NEGATIVE  NEGATIVE Final   Comment:            The GeneXpert MRSA Assay (FDA     approved for NASAL specimens     only), is one component of a     comprehensive MRSA colonization     surveillance program. It is not     intended to diagnose MRSA     infection nor to guide or     monitor treatment for     MRSA infections.  URINE CULTURE     Status: None   Collection Time  01/07/2014  6:17 PM      Result Value Ref Range Status   Specimen Description URINE, CATHETERIZED   Final   Special Requests ADDED [id number]   Final   Culture  Setup Time     Final   Value: 12/28/2013 08:41     Performed at [facility]   Colony Count     Final   Value: NO GROWTH     Performed at [facility]   Culture     Final   Value: NO GROWTH     Performed at [facility]   Report Status 12/29/2013 FINAL   Final  CULTURE, BLOOD (ROUTINE X 2)     Status: None   Collection Time    12/26/2013 10:46 PM      Result Value Ref Range Status   Specimen Description BLOOD LEFT HAND   Final   Special Requests BOTTLES DRAWN AEROBIC ONLY [facility]   Final   Culture  Setup Time     Final   Value: 12/28/2013 08:39     Performed at [facility]   Culture     Final   Value: NO GROWTH 5 DAYS     Performed at [facility]   Report Status 01/03/2014 FINAL   Final  CULTURE, BLOOD (ROUTINE X 2)     Status: None   Collection Time    01/02/2014 10:52 PM      Result Value Ref Range Status   Specimen Description BLOOD RIGHT HAND   Final   Special Requests BOTTLES DRAWN AEROBIC ONLY [facility]   Final   Culture  Setup Time     Final   Value: 12/28/2013 08:39      Performed at [facility]   Culture     Final   Value: NO GROWTH 5 DAYS     Performed at [facility]   Report Status 01/03/2014 FINAL   Final     Studies:  Recent x-ray studies have been reviewed in detail by the Attending Physician  Scheduled Meds:  Scheduled Meds: . antiseptic oral rinse  15 mL Mouth Rinse q12n4p  . brimonidine  1 drop Both Eyes BID  . carvedilol  12.5 mg Oral BID WC  . chlorhexidine  15 mL Mouth Rinse BID  . cholecalciferol  400 Units Oral Daily  . [START ON 01/08/2014] cloNIDine  0.2 mg Transdermal Weekly  . darbepoetin  40 mcg Subcutaneous Q Sat-1800  . ferrous sulfate  325 mg Oral TID WC  . hydrALAZINE  10 mg Intravenous 4 times per day  . methimazole  5 mg Oral Daily  . nitroGLYCERIN  0.2 mg Transdermal Daily  . pantoprazole (PROTONIX) IV  40 mg Intravenous QHS  . scopolamine  1 patch Transdermal Q72H  . sodium bicarbonate  1,300 mg Oral BID  . timolol  1 drop Both Eyes BID  . zinc sulfate  220 mg Oral Daily    Time spent on care of this patient: 35 mins  Lonia Blood, MD [facility] For Consults/Admissions - Flow Manager - [phone] Office  [phone] Pager [phone]  On-Call/Text Page:      [email or web address]      password [facility]  01/04/2014, 2:34 PM   LOS: 8 days

## 2014-01-04 NOTE — Progress Notes (Signed)
ATC setup changed 

## 2014-01-04 NOTE — Progress Notes (Signed)
ANTICOAGULATION CONSULT NOTE  Pharmacy Consult for heparin Indication: atrial fibrillation/DVT   Allergies  Allergen Reactions  . Codeine Other (See Comments)    Pass out    . Ertapenem     From  Med list from [place]   . Fish-Derived Products     From med list from [facility]  . Peanut Butter Flavor Hives and Other (See Comments)    Lockjaw   . Penicillins Swelling    Patient Measurements: Height: 5\' 2"  (157.5 cm) Weight: 233 lb 7.5 oz (105.9 kg) IBW/kg (Calculated) : 50.1 Heparin dosing weight: 77 kg  Vital Signs: Temp: 98 F (36.7 C) (06/24 1229) Temp src: Oral (06/24 1229) BP: 112/52 mmHg (06/24 1229) Pulse Rate: 75 (06/24 1229) Intake/Output from previous day: 06/23 0701 - 06/24 0700 In: 2407.5 [I.V.:2257.5; IV Piggyback:150] Out: 175 [Urine:175] Intake/Output from this shift: Total I/O In: 105 [I.V.:105] Out: -    Labs:  Recent Labs  January 22, 2014 2246  HGB 7.1*  HCT 22.2*  PLT 167     Recent Labs  01/02/14 0200 01/03/14 0338  WBC 7.5 8.3  HGB 7.6* 8.1*  PLT 168 212  CREATININE  --  1.71*   Estimated Creatinine Clearance: 37 ml/min (by C-G formula based on Cr of 1.71).  Assessment: 89 YOF with recent prolonged hospitalization at [facility] including trach. On chronic anticoagulation for h/o DVT and pAFib.  Pt received warfarin x1 dose on 6/16 and was subsequently held due to possible need to go to the OR. Heparin drip started for bridging - held 6/19-6/20 for trach placement and bleeding around the trach site.   Heparin level this morning remains therapeutic (HL 0.31, goal of 0.3-0.7), no new CBC today. No overt bleeding noted.  Goal of Therapy:  Heparin level: 0.3-0.7 Monitor platelets while on anticoagulation: Yes  Plan:  1. Continue heparin gtt 1250 units/hr 2. F/u AM heparin level and CBC 3. F/u plans to resume warfarin  Lysle Pearl, PharmD, BCPS Pager # [phone] 01/04/2014 12:38 PM

## 2014-01-04 NOTE — Progress Notes (Signed)
Pt. Started having difficulty breathing and her O2 stats started to decline. RT was called and came to the room, pt had a small clot in trach catheter which was cleared and put back. Pt was still having difficulty breathing, rt charge was called and came to the room. Pt was lavaged and bagged. Pt continued to have trouble breathing. A stat portable chest xray was ordered and the pt was found to have her trach catheter in wrong place.  Merdis Delay was called and PCCM.Janina Mayo was removed and replaced with a #6 shilley placement was confirmed by CO2 detector. Dr. Delton Coombes came to pts room to check on pts. Status.

## 2014-01-04 NOTE — Progress Notes (Signed)
Physical Therapy Treatment Patient Details Name: Sarah Tran MRN: [id number] DOB: 15-Jan-1946 Today's Date: 01/04/2014    History of Present Illness Patient is a 68 yo female admitted 12/18/2013 with resp distress, HCAP, and is s/p trach revision 12/14/2013.  Patient with PMH of recent prolonged hospitalization including tracheostomy, Afib, HTN, DM.    PT Comments    Pt progressing towards physical therapy goals. Was able to ambulate during session without physical assist and demonstrated ability to transfer to[facility]from [facility] and perform hygiene without assist. Will continue to follow.   Follow Up Recommendations  Home health PT;Supervision/Assistance - 24 hour     Equipment Recommendations  None recommended by PT    Recommendations for Other Services       Precautions / Restrictions Precautions Precautions: Fall Precaution Comments: Trach, colostomy Restrictions Weight Bearing Restrictions: No    Mobility  Bed Mobility Overal bed mobility: Needs Assistance Bed Mobility: Supine to Sit     Supine to sit: Min assist;HOB elevated     General bed mobility comments: Increased time to scoot to EOB. Pt with difficulty moving LE's over lowered bed rail to sit squared on the edge. Assist for pt to finish scooting R hip out so both feet were resting on floor.   Transfers Overall transfer level: Needs assistance Equipment used: Rolling walker (2 wheeled) Transfers: Sit to/from [employer] Sit to Stand: Min guard Stand pivot transfers: Min guard       General transfer comment: Pt asking for family member assist to come to full standing. Was able to stand without assist when cued for hand placement on seated surface. Able to transfer recliner<>BSC with assist for guiding hands to arm rests.   Ambulation/Gait Ambulation/Gait assistance: Min guard Ambulation Distance (Feet): 100 Feet Assistive device: Rolling walker (2 wheeled) Gait Pattern/deviations: Step-through  pattern;Decreased stride length;Trunk flexed Gait velocity: Decreased Gait velocity interpretation: Below normal speed for age/gender General Gait Details: Pt able to ambulate with RW without physical assist. 1 standing rest break required.    Stairs            Wheelchair Mobility    Modified Rankin (Stroke Patients Only)       Balance Overall balance assessment: Needs assistance Sitting-balance support: Feet supported;No upper extremity supported Sitting balance-Leahy Scale: Good     Standing balance support: Bilateral upper extremity supported Standing balance-Leahy Scale: Poor                      Cognition Arousal/Alertness: Awake/alert Behavior During Therapy: WFL for tasks assessed/performed Overall Cognitive Status: Within Functional Limits for tasks assessed                      Exercises      General Comments        Pertinent Vitals/Pain Pt on RA for a portion of session. O2 sats decreased to 87% during transfer to EOB and supplemental O2 was again donned. Pt ambulated on supplemental O2 and sats remained >93% throughout session.     Home Living                      Prior Function            PT Goals (current goals can now be found in the care plan section) Acute Rehab PT Goals Patient Stated Goal: To return home PT Goal Formulation: With patient/family Time For Goal Achievement: 01/07/14 Potential to Achieve Goals: Good Progress towards  PT goals: Progressing toward goals    Frequency  Min 3X/week    PT Plan Current plan remains appropriate    Co-evaluation             End of Session Equipment Utilized During Treatment: Gait belt;Oxygen Activity Tolerance: Patient tolerated treatment well Patient left: in chair;with call bell/phone within reach;with family/visitor present     Time: [id number] PT Time Calculation (min): 28 min  Charges:  $Gait Training: 8-22 mins $Therapeutic Activity: 8-22 mins                     G Codes:      Ruthann CancerHamilton, Laura 01/04/2014, 1:10 PM  Ruthann CancerLaura Hamilton, PT, DPT Acute Rehabilitation Services Pager: [phone]

## 2014-01-04 NOTE — Progress Notes (Signed)
Nutrition Brief Note  Patient identified on the Malnutrition Screening Tool (MST) Report for recent weight lost without trying (patient unsure).  Per wt readings, pt has had a 7% weight loss since mid-May 2015; desirable given morbid obesity.  Wt Readings from Last 15 Encounters:  01/04/14 233 lb 7.5 oz (105.9 kg)  01/04/14 233 lb 7.5 oz (105.9 kg)  12/01/13 231 lb 11.3 oz (105.1 kg)  11/21/13 251 lb (113.853 kg)    Body mass index is 42.69 kg/(m^2). Patient meets criteria for Obesity Class III based on current BMI.   Current diet order is Regular. Labs and medications reviewed.   No nutrition interventions warranted at this time. If nutrition issues arise, please consult RD.   Maureen Chatters, RD, LDN Pager #: [phone] After-Hours Pager #: [phone]

## 2014-01-05 DIAGNOSIS — T797XXA Traumatic subcutaneous emphysema, initial encounter: Secondary | ICD-10-CM

## 2014-01-05 DIAGNOSIS — I82409 Acute embolism and thrombosis of unspecified deep veins of unspecified lower extremity: Secondary | ICD-10-CM

## 2014-01-05 DIAGNOSIS — I4891 Unspecified atrial fibrillation: Secondary | ICD-10-CM

## 2014-01-05 DIAGNOSIS — J982 Interstitial emphysema: Secondary | ICD-10-CM

## 2014-01-05 LAB — GLUCOSE, CAPILLARY
GLUCOSE-CAPILLARY: 117 mg/dL — AB (ref 70–99)
GLUCOSE-CAPILLARY: 129 mg/dL — AB (ref 70–99)
GLUCOSE-CAPILLARY: 118 mg/dL — AB (ref 70–99)
Glucose-Capillary: 101 mg/dL — ABNORMAL HIGH (ref 70–99)
Glucose-Capillary: 133 mg/dL — ABNORMAL HIGH (ref 70–99)
Glucose-Capillary: 151 mg/dL — ABNORMAL HIGH (ref 70–99)

## 2014-01-05 LAB — CBC
HEMATOCRIT: 22.7 % — AB (ref 36.0–46.0)
HEMOGLOBIN: 7.4 g/dL — AB (ref 12.0–15.0)
MCH: 30 pg (ref 26.0–34.0)
MCHC: 32.6 g/dL (ref 30.0–36.0)
MCV: 91.9 fL (ref 78.0–100.0)
Platelets: 198 10*3/uL (ref 150–400)
RBC: 2.47 MIL/uL — ABNORMAL LOW (ref 3.87–5.11)
RDW: 15.8 % — AB (ref 11.5–15.5)
WBC: 6.6 10*3/uL (ref 4.0–10.5)

## 2014-01-05 LAB — BASIC METABOLIC PANEL
BUN: 23 mg/dL (ref 6–23)
CO2: 24 meq/L (ref 19–32)
Calcium: 9.1 mg/dL (ref 8.4–10.5)
Chloride: 97 mEq/L (ref 96–112)
Creatinine, Ser: 2.53 mg/dL — ABNORMAL HIGH (ref 0.50–1.10)
GFR calc Af Amer: 22 mL/min — ABNORMAL LOW (ref >90)
GFR calc non Af Amer: 19 mL/min — ABNORMAL LOW (ref >90)
Glucose, Bld: 133 mg/dL — ABNORMAL HIGH (ref 70–99)
Potassium: 3.7 mEq/L (ref 3.7–5.3)
SODIUM: 135 meq/L — AB (ref 137–147)

## 2014-01-05 LAB — HEPATIC FUNCTION PANEL
ALBUMIN: 2.3 g/dL — AB (ref 3.5–5.2)
ALK PHOS: 91 U/L (ref 39–117)
ALT: 6 U/L (ref 0–35)
AST: 14 U/L (ref 0–37)
Bilirubin, Direct: <0.2 mg/dL (ref 0.0–0.3)
TOTAL PROTEIN: 5.4 g/dL — AB (ref 6.0–8.3)
Total Bilirubin: 0.5 mg/dL (ref 0.3–1.2)

## 2014-01-05 LAB — PROTIME-INR
INR: 1.22 (ref 0.00–1.49)
Prothrombin Time: 15.4 seconds — ABNORMAL HIGH (ref 11.6–15.2)

## 2014-01-05 LAB — HEMOGLOBIN A1C
HEMOGLOBIN A1C: 6.1 % — AB (ref <5.7)
Mean Plasma Glucose: 128 mg/dL — ABNORMAL HIGH (ref <117)

## 2014-01-05 LAB — HEPARIN LEVEL (UNFRACTIONATED)
HEPARIN UNFRACTIONATED: 0.25 [IU]/mL — AB (ref 0.30–0.70)
HEPARIN UNFRACTIONATED: 0.54 [IU]/mL (ref 0.30–0.70)

## 2014-01-05 MED ORDER — SIMETHICONE 80 MG PO CHEW
160.0000 mg | CHEWABLE_TABLET | Freq: Four times a day (QID) | ORAL | Status: DC | PRN
  Administered 2014-01-05 (×2): 160 mg via ORAL
  Filled 2014-01-05 (×2): qty 2

## 2014-01-05 MED ORDER — WARFARIN SODIUM 2.5 MG PO TABS
2.5000 mg | ORAL_TABLET | Freq: Once | ORAL | Status: DC
  Filled 2014-01-05: qty 1

## 2014-01-05 MED ORDER — HYDRALAZINE HCL 10 MG PO TABS
10.0000 mg | ORAL_TABLET | Freq: Four times a day (QID) | ORAL | Status: DC
  Administered 2014-01-05: 10 mg via ORAL
  Filled 2014-01-05 (×8): qty 1

## 2014-01-05 MED ORDER — ZOLPIDEM TARTRATE 5 MG PO TABS
5.0000 mg | ORAL_TABLET | Freq: Every evening | ORAL | Status: DC | PRN
  Administered 2014-01-05: 5 mg via ORAL
  Filled 2014-01-05: qty 1

## 2014-01-05 NOTE — Progress Notes (Signed)
Occupational Therapy Treatment Patient Details Name: Sarah Tran MRN: [id number] DOB: 1946/07/05 Today's Date: 01/05/2014    History of present illness Patient is a 68 yo female admitted 19-Jan-2014 with resp distress, HCAP, and is s/p trach revision 01/06/2014.  Patient with PMH of recent prolonged hospitalization including tracheostomy, Afib, HTN, DM.   OT comments  Pt demonstrates incr mobility this session and incr awareness of need for rest breaks. Pt with decr oxygen saturations and taking a brief rest break to rebound. Daughter reports having a pulse ox at home to help with monitoring. Ot to continue to follow acutely. Pt ambulated ~100 ft this session with 5L oxygen  Follow Up Recommendations  Home health OT;Supervision/Assistance - 24 hour    Equipment Recommendations  Hospital bed    Recommendations for Other Services      Precautions / Restrictions Precautions Precautions: Fall Precaution Comments: Trach, colostomy       Mobility Bed Mobility               General bed mobility comments: in chair on arrival  Transfers Overall transfer level: Needs assistance Equipment used: Rolling walker (2 wheeled) Transfers: Sit to/from Stand Sit to Stand: Min guard Stand pivot transfers: Min guard       General transfer comment: Pt with daughter present to help if needed.     Balance Overall balance assessment: Needs assistance         Standing balance support: Bilateral upper extremity supported;During functional activity Standing balance-Leahy Scale: Fair                     ADL Overall ADL's : Needs assistance/impaired     Grooming: Wash/dry hands;Modified independent;Sitting Grooming Details (indicate cue type and reason): using wash cloth to clean hands                 Toilet Transfer: Min guard;RW;BSC             General ADL Comments: Pt agreeable to short ambulation due to tv show on until 3pm. Pts daughter present and encouraging  patient to participate. pt c/o abdominal discomfort all night and today. pt without void in ostomy bag per patient. Pt demonstrates independent burping of ostomy bag in chair. New socks ordered for patient to help with fit during mobility.      Vision                     Perception     Praxis      Cognition   Behavior During Therapy: WFL for tasks assessed/performed Overall Cognitive Status: Within Functional Limits for tasks assessed                       Extremity/Trunk Assessment               Exercises     Shoulder Instructions       General Comments      Pertinent Vitals/ Pain       HR 109 starting and during mobility 120 max       Oxygen decr to 86% of 6 L        Overall vitals much more stable than previous sessions  Home Living  Prior Functioning/Environment              Frequency Min 2X/week     Progress Toward Goals  OT Goals(current goals can now be found in the care plan section)  Progress towards OT goals: Progressing toward goals  Acute Rehab OT Goals Patient Stated Goal: To return home OT Goal Formulation: With patient/family Time For Goal Achievement: 01/17/14 Potential to Achieve Goals: Good ADL Goals Pt Will Perform Upper Body Bathing: with min assist;sitting Pt Will Transfer to Toilet: with min assist;bedside commode Pt/caregiver will Perform Home Exercise Program: Both right and left upper extremity;With theraband;With Supervision;With written HEP provided  Plan Discharge plan remains appropriate    Co-evaluation                 End of Session     Activity Tolerance Patient tolerated treatment well   Patient Left in chair;with call bell/phone within reach;with family/visitor present   Nurse Communication Mobility status;Precautions        Time: [id number] OT Time Calculation (min): 24 min  Charges: OT General Charges $OT Visit: 1  Procedure OT Treatments $Therapeutic Activity: 23-37 mins  Sarah Tran, Jessica B 01/05/2014, 2:38 PM Pager: [phone]

## 2014-01-05 NOTE — Progress Notes (Signed)
[place] TEAM 1 - Stepdown/ICU TEAM Progress Note  Sarah Tran RXV:[id number] DOB: 03/21/1946 DOA: 12/31/2013 PCP: Talmage Coin, MD  Admit HPI / Brief Narrative: 68 yo BF PMHx hypertension, renal insufficiency, obstructive sleep apnea with CPAP, DVT/A. fib with chronic Coumadin. admitted to [facility] on September 28, 2013, for removal of ileal polyp. Noted perioperative complications, balloon catheter became dislodged. She required exploratory laparotomy and diverting colostomy as well as appendectomy secondary to perforated sigmoid colon. Hospital course complicated by respiratory failure, requiring intubation, eventually tracheostomy as well as gastrostomy tube for nutritional support. She remained in intensive care for 28 days. The patient developed sepsis, cultures  growing Gram-positive cocci in clusters, maintained on broad-spectrum antibiotics. Patient stabilized, transferred to [facility] on October 25, 2013, for further vent weaning and wound care. An  echocardiogram completed on October 28, 2013, showing mild left ventricular hypertrophy with ejection fraction 50-60%. 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 Hospital course complicated by acute renal insufficiency, creatinine 2.51, atrial fibrillation with RVR question angioedema from Invanz reaction and wound dehiscence. The patient's tracheostomy tube downsized, later decannulated on Nov 18, 2013. The patient did develop some respiratory  stridor, placed on prednisone taper. Maintained on a mechanical soft diet. Patient with chronic anemia, hemoglobin varying 8.4 to 8.9. Subcutaneous heparin for DVT prophylaxis. The patient's chronic  Coumadin had been resumed on Nov 22, 2013, The patient was admitted for comprehensive rehab program. On 5/12 Patient admitted to [facility], discharged 6/1. Went to [facility] 6/16 for  respiratory distress, CXR suggestive of PNA. Transferred to [facility] via helicopter for concerns that she may need emergent bedside trach/airway.      HPI/Subjective: 6/25 on 6/23 Trach changed to 4 cuffless.  6/24 patient began to have respiratory distress, and was discovered trach malpositioned - changed at bedside urgently to #6 per PCCM. Today patient sitting in chair comfortably able to compensate with medical staff and family. States will stay here in [place] instead of returning to her home in [place].   Assessment/Plan:  Acute on chronic respiratory failure with hypoxia -Resolved will monitor patient overnight and if still stable consider discharge on 6/24 - Dr. Brynda Peon (ENT) changed trach on 6/23 to 4 cuffless. -Trach bleeding resolved.  -ENT following (Dr Lazarus Salines)  -Cont ATC as tol  -Albuterol PRN  -pulm hygiene  -Intermittent f/u CXR  -ST cont to follow for PMV  -Needs referral to academic facility for thyroidectomy and possible tracheal reconstruction; would defer to ENT if still required  Pneumomediastinum/subcutaneous emphysema right neck -Patient clinically asymptomatic -Obtain PCXR on 6/26 prior to discharge to check for interval change  HTN  -Continue Coreg at a lower dose 12.5 mg BID (home dose 25 mgBID) -Continue clonidine transdermal 0.2 mg -Start hydralazine PO 10 mg QID, in preparation for discharge   Dilated cardiomyopathy -See HTN  Dyslipidemia  -Lipid panel; within AHA guideline  Hx AFib - now NSR , continue to monitor -Continue heparin drip -On 6/24 restart Coumadin -6/25 INR= 1.22  Hx DVT  Continue heparin drip, while transitioning over to Coumadin  Acute on chronic kidney failure - improving  -Monitor overnight and if continues to trend up will need to determine increasing renal failure vs mild fluid overload in patient with dilated cardiomyopathy -Continue to push PO hydration -Baseline creatinine 1.97-3.0  Hypomagnesemia    -Mg Goal> 2 -Check in a.m.   Hypokalemia Potassium  goal>4 -Slightly under goal will check in a.m. and if still low place    AF / DVT -On chronic anticoagulation; subtherapeutic on transfer to [facility] -Continue heparin drip, if stable on 6/24 consider restarting Coumadin  Trach site bleeding  - resolved    Hyperthyroidism  -Continue methimazole 5 mg daily imazole as preadmission when able  -Will need referral for thyroidectomy; defer to ENT       Code Status: FULL Family Communication: no family present at time of exam Disposition Plan: Next 48-72 hours; when INR close to therapeutic    Consultants: Dr. Levy Pupa (PCCM) Dr. Beverlee Nims (ENT)   Procedure/Significant Events: 4/17 echocardiogram; LVEF= 55% -60% ; Left atrium: severely dilated. 6/17 Laryngoscopy by ENT >>> Normal vocal cords, tracheomalacia / stenosis  6/19 Tracheostomy by ENT  6/20 Failed swallow eval  6/22 passed swallow eval  6/23 trach downsized to #4 per ENT  6/24 trach malpositioned - changed at bedside urgently to #6 per [facility]  6/24 PCXR; New pneumomediastinum. Subcutaneous emphysema in the right neck; Mild cardiomegaly, with perihilar densities potentially reflecting early pulmonary edema or atelectasis.     Culture 6/16 MRSA by PCR negative 6/16 Blood left/right hand negative (final)   6/16 Urine negative (final)   Antibiotics: Levaquin 6/16 >>> 6/17  Vancomycin 6/16 >>> 6/17   DVT prophylaxis: Heparin drip   Devices #6 Trach Cuffless  LINES / TUBES:  Foley 6/16 >>>  Trach (ENT) 6/19 >>>     Continuous Infusions: . heparin 1,350 Units/hr (01/05/14 0622)    Objective: VITAL SIGNS: Temp: 98.2 F (36.8 C) (06/25 1634) Temp src: Oral (06/25 1634) Pulse Rate: 83 (06/25 1628) SPO2; 99% on trach collar FIO2: 28 L per minute   Intake/Output Summary (Last 24 hours) at 01/05/14 1708 Last data filed at 01/05/14 0900  Gross per 24 hour  Intake  402.5 ml  Output       0 ml  Net  402.5 ml     Exam: General: A/O x 4 , NAD, No acute respiratory distress, mild chafing of the skin around trach site negative sign of bleeding Lungs: Clear to auscultation bilaterally without wheezes or crackles Cardiovascular: Regular rate and rhythm without murmur gallop or rub normal S1 and S2 Abdomen: Nontender, nondistended, soft, bowel sounds positive, no rebound, no ascites, no appreciable mass, colostomy bag in place negative sign of infection Extremities: No significant cyanosis, clubbing, or edema bilateral lower extremities  Data Reviewed: Basic Metabolic Panel:  Recent Labs Lab 01/08/2014 0353 12/31/13 0235 01/01/14 0320 01/03/14 0338 01/05/14 0005  NA 134* 139 140 140 135*  K 3.8 4.0 3.9 3.2* 3.7  CL 96 101 102 98 97  CO2 25 27 23 26 24   GLUCOSE 105* 117* 104* 119* 133*  BUN 30* 26* 23 19 23   CREATININE 2.16* 1.84* 1.78* 1.71* 2.53*  CALCIUM 9.2 9.3 9.3 9.9 9.1  MG  --   --  1.8 1.7  --   PHOS  --   --  3.6 2.9  --    Liver Function Tests:  Recent Labs Lab 01/05/14 0315  AST 14  ALT 6  ALKPHOS 91  BILITOT 0.5  PROT 5.4*  ALBUMIN 2.3*   No results found for this basename: LIPASE, AMYLASE,  in the last 168 hours No results found for this basename: AMMONIA,  in the last 168 hours CBC:  Recent Labs Lab 12/31/13 0235 01/01/14 0320 01/02/14 0200 01/03/14 0338 01/05/14 0315  WBC 6.9 7.4 7.5  8.3 6.6  HGB 8.8* 8.3* 7.6* 8.1* 7.4*  HCT 26.9* 25.1* 23.5* 25.1* 22.7*  MCV 90.0 90.6 90.7 90.6 91.9  PLT 189 181 168 212 198   Cardiac Enzymes: No results found for this basename: CKTOTAL, CKMB, CKMBINDEX, TROPONINI,  in the last 168 hours BNP (last 3 results)  Recent Labs  January 06, 2014 2246  PROBNP 838.6*   CBG:  Recent Labs Lab 01/05/14 0004 01/05/14 0418 01/05/14 0753 01/05/14 1157 01/05/14 1629  GLUCAP 133* 117* 101* 129* 151*    Recent Results (from the past 240 hour(s))  MRSA PCR SCREENING     Status: None   Collection Time     01/06/14  6:17 PM      Result Value Ref Range Status   MRSA by PCR NEGATIVE  NEGATIVE Final   Comment:            The GeneXpert MRSA Assay (FDA     approved for NASAL specimens     only), is one component of a     comprehensive MRSA colonization     surveillance program. It is not     intended to diagnose MRSA     infection nor to guide or     monitor treatment for     MRSA infections.  URINE CULTURE     Status: None   Collection Time    January 06, 2014  6:17 PM      Result Value Ref Range Status   Specimen Description URINE, CATHETERIZED   Final   Special Requests ADDED [id number]   Final   Culture  Setup Time     Final   Value: 12/28/2013 08:41     Performed at [facility]   Colony Count     Final   Value: NO GROWTH     Performed at [facility]   Culture     Final   Value: NO GROWTH     Performed at [facility]   Report Status 12/29/2013 FINAL   Final  CULTURE, BLOOD (ROUTINE X 2)     Status: None   Collection Time    01/06/14 10:46 PM      Result Value Ref Range Status   Specimen Description BLOOD LEFT HAND   Final   Special Requests BOTTLES DRAWN AEROBIC ONLY [facility]   Final   Culture  Setup Time     Final   Value: 12/28/2013 08:39     Performed at [facility]   Culture     Final   Value: NO GROWTH 5 DAYS     Performed at [facility]   Report Status 01/03/2014 FINAL   Final  CULTURE, BLOOD (ROUTINE X 2)     Status: None   Collection Time    01/06/2014 10:52 PM      Result Value Ref Range Status   Specimen Description BLOOD RIGHT HAND   Final   Special Requests BOTTLES DRAWN AEROBIC ONLY 1CC   Final   Culture  Setup Time     Final   Value: 12/28/2013 08:39     Performed at [facility]   Culture     Final   Value: NO GROWTH 5 DAYS     Performed at [facility]   Report Status 01/03/2014 FINAL   Final     Studies:  Recent x-ray studies have been reviewed in detail by the Attending Physician  Scheduled  Meds:  Scheduled Meds: . antiseptic  oral rinse  15 mL Mouth Rinse q12n4p  . brimonidine  1 drop Both Eyes BID  . carvedilol  12.5 mg Oral BID WC  . chlorhexidine  15 mL Mouth Rinse BID  . cholecalciferol  400 Units Oral Daily  . [START ON 01/08/2014] cloNIDine  0.2 mg Transdermal Weekly  . darbepoetin  40 mcg Subcutaneous Q Sat-1800  . ferrous sulfate  325 mg Oral TID WC  . hydrALAZINE  10 mg Intravenous 4 times per day  . methimazole  5 mg Oral Daily  . nitroGLYCERIN  0.2 mg Transdermal Daily  . pantoprazole (PROTONIX) IV  40 mg Intravenous QHS  . scopolamine  1 patch Transdermal Q72H  . senna-docusate  1 tablet Oral BID  . sodium bicarbonate  1,300 mg Oral BID  . timolol  1 drop Both Eyes BID  . warfarin  2.5 mg Oral ONCE-1800  . Warfarin - Pharmacist Dosing Inpatient   Does not apply q1800  . zinc sulfate  220 mg Oral Daily    Time spent on care of this patient: 40 mins   Drema DallasWOODS, CURTIS, J , MD   [facility] Office  [phone] Pager [phone]  On-Call/Text Page:      [email or web address]      password [email or web address]  If 7PM-7AM, please contact night-coverage [email or web address] Password [email or web address] 01/05/2014, 5:08 PM   LOS: 9 days

## 2014-01-05 NOTE — Progress Notes (Signed)
SATURATION QUALIFICATIONS: (This note is used to comply with regulatory documentation for home oxygen)  Patient Saturations on Room Air at Rest = 86  Patient Saturations on Room Air while Ambulating = 86  Patient Saturations on 5 Liters of oxygen while Ambulating =96  Please briefly explain why patient needs home oxygen: Patient is on 28% trach collar

## 2014-01-05 NOTE — Trach Care Team (Signed)
Trach Care Progression Note   Patient Details Name: Sarah Tran MRN: [id number] DOB: Jul 14, 1946 Today's Date: 01/05/2014   Tracheostomy Assessment    Tracheostomy Shiley 6 mm Cuffed (Active)  Status Passy Muir Speaking valve 01/05/2014  1:11 PM  Site Assessment Clean;Dry 01/05/2014  1:11 PM  Site Care Cleansed;Dressing applied 01/05/2014  4:00 AM  Inner Cannula Care Changed/new 01/05/2014  7:53 AM  Ties Assessment Clean 01/05/2014  1:11 PM  Cuff pressure (cm) 0 cm 01/05/2014  1:11 PM  Trach Changed Yes 01/04/2014  1:00 AM  Emergency Equipment at bedside Yes 01/05/2014  1:11 PM     Care Needs     Respiratory Therapy Tracheostomy: Chronic trach Trach Progression: Other (comment);Change to cuffless (questionable ) O2 Device: Trach collar FiO2 (%): 28 % SpO2: 94 % Education: Initiated Who was educated?: Patient;Family Follow up recommendations: follow up tomorrow before d/c Respiratory barriers to progression: Trach size    Speech Language Pathology  SLP chart review complete: Patient currently receiving at SLP services Patient may use Passy-Muir Speech Valve: During all waking hours (remove during sleep) PMSV Supervision: Full MD: Please consider changing trach tube to : Cuffless;Smaller size Follow up Recommendations: Skilled Nursing facility   Physical Therapy Ambulation/Gait assistance: Min guard PT Recommendation/Assessment: Patient needs continued PT services Follow Up Recommendations: Home health PT;Supervision/Assistance - 24 hour PT equipment: None recommended by PT    Occupational Therapy OT Recommendation/Assessment: Patient needs continued OT Services Follow Up Recommendations: Home health OT;Supervision/Assistance - 24 hour OT Equipment: Hospital bed (question need for hospital bed to help with mobility)    Nutritional Patient's Current Diet: Regular Diet Recommendations: Thin liquid;Regular    Case Management/Social Work Level of patient care prior to  hospitalization: Home-Self care Living status: Family (document relation) Insurance payer: Medicare Barriers to progression: clinical- heparin/hydralazine Anticipated discharge disposition: Home-Home Health (may go to [facility] for revision of larynx)    Provider                                                             Trach Care Team Recommendations               None    [facility] Members -  [place], SLP, Nicolasa Ducking, CM, [place], SW, [place], RT and Kendell Bane, RD          [place], Debra Anita(scribe for team) 01/05/2014, 1:53 PM

## 2014-01-05 NOTE — Progress Notes (Signed)
ANTICOAGULATION CONSULT NOTE - Follow Up Consult  Pharmacy Consult for Heparin  Indication: atrial fibrillation/hx of DVT  Allergies  Allergen Reactions  . Codeine Other (See Comments)    Pass out    . Ertapenem     From  Med list from [facility]   . Fish-Derived Products     From med list from [facility]  . Peanut Butter Flavor Hives and Other (See Comments)    Lockjaw   . Penicillins Swelling    Patient Measurements: Height: 5\' 2"  (157.5 cm) Weight: 239 lb (108.41 kg) IBW/kg (Calculated) : 50.1 Heparin Dosing Weight: 77 kg  Vital Signs: Temp: 99 F (37.2 C) (06/25 0400) Temp src: Oral (06/25 0400) BP: 127/57 mmHg (06/25 0506) Pulse Rate: 82 (06/25 0400)  Labs:  Recent Labs  01/03/14 0338 01/03/14 2050 01/04/14 0320 01/05/14 0005 01/05/14 0315  HGB 8.1*  --   --   --  7.4*  HCT 25.1*  --   --   --  22.7*  PLT 212  --   --   --  198  LABPROT  --  15.6*  --   --  15.4*  INR  --  1.27  --   --  1.22  HEPARINUNFRC 0.31  --  0.31  --  0.25*  CREATININE 1.71*  --   --  2.53*  --     Estimated Creatinine Clearance: 25.3 ml/min (by C-G formula based on Cr of 2.53).   Medications:  Heparin 1250 units/hr  Assessment: 68 y/o F on heparin for afib/hx DVT. HL is 0.25. Other labs as above.   Goal of Therapy:  Heparin level 0.3-0.7 units/ml Monitor platelets by anticoagulation protocol: Yes   Plan:  -Increase heparin drip to 1350 units/hr -1300 HL -Daily CBC/HL -Monitor for bleeding  Abran Duke 01/05/2014,5:17 AM

## 2014-01-05 NOTE — Progress Notes (Signed)
ANTICOAGULATION CONSULT NOTE - Follow Up Consult  Pharmacy Consult for Heparin + warfarin Indication: atrial fibrillation/hx of DVT  Allergies  Allergen Reactions  . Codeine Other (See Comments)    Pass out    . Ertapenem     From  Med list from [facility]   . Fish-Derived Products     From med list from [facility]  . Peanut Butter Flavor Hives and Other (See Comments)    Lockjaw   . Penicillins Swelling    Patient Measurements: Height: 5\' 2"  (157.5 cm) Weight: 239 lb (108.41 kg) IBW/kg (Calculated) : 50.1 Heparin Dosing Weight: 77 kg  Vital Signs: Temp: 98 F (36.7 C) (06/25 1200) Temp src: Oral (06/25 1200) BP: 127/57 mmHg (06/25 0506) Pulse Rate: 74 (06/25 1147)  Labs:  Recent Labs  01/03/14 0338 01/03/14 2050 01/04/14 0320 01/05/14 0005 01/05/14 0315 01/05/14 1257  HGB 8.1*  --   --   --  7.4*  --   HCT 25.1*  --   --   --  22.7*  --   PLT 212  --   --   --  198  --   LABPROT  --  15.6*  --   --  15.4*  --   INR  --  1.27  --   --  1.22  --   HEPARINUNFRC 0.31  --  0.31  --  0.25* 0.54  CREATININE 1.71*  --   --  2.53*  --   --     Estimated Creatinine Clearance: 25.3 ml/min (by C-G formula based on Cr of 2.53).   Medications:  Heparin 1350 units/hr  Assessment: 68 y/o F continues on heparin and coumadin for afib/hx DVT. HL is therapeutic at 0.54. H/H is down slightly but no bleeding noted. INR remains subtherapeutic as expected at 1.22.   Goal of Therapy:  Heparin level 0.3-0.7 units/ml Monitor platelets by anticoagulation protocol: Yes INR 2-3   Plan:  1. Continue heparin gtt 1350 units/hr 2. Repeat coumadin 2.5mg  PO x 1 tonight 3. F/u AM heparin level, CBC and INR  Lysle Pearl, PharmD, BCPS Pager # [phone] 01/05/2014 2:18 PM

## 2014-01-05 NOTE — Progress Notes (Signed)
Speech Language Pathology Treatment: Dysphagia;Passy Muir Speaking valve  Patient Details Name: Sarah Tran MRN: [id number] DOB: [birth year] Today's Date: 01/05/2014 Time: 1121-1129 SLP Time Calculation (min): 8 min  Assessment / Plan / Recommendation Clinical Impression  Pt. exhibited modified independence consuming regular texture and thin liquids with PMSV donned.  SLP reviewed guidelines for valve (remove when sleeping, donn for meals/snacks).  No indications of pharyngeal difficulty with po's.  Breath support mildly decreased with cues for deep breaths prior to phonation.  Intelligibility is 100%. She needs education and practice for donn and doff independently (not addressed today).  Continue ST.   HPI HPI: 68 yo female adm to [facility] with respiratory distress requiring s/p trach placement Friday 01/04/2014.  Pt for PMSV and swallow evaluation per CCS. Pt has a prior history of trach, but was evalauted for dysphagia by LTACH and CIR, started on dys 3 diet and thinl iquids and upgraded to regular, which pt has been toelratin at home.   Per ENT- hopeful get her downsized to a #4 and use Passey -Muir valve for  communication. CT scan to be repeated in 6 weeks to see if the partial thyroidectomy will help open the tracheal lumen per ENT.  And ENT hopes for decannulation in long term.  Pt does not recall using PMSV or having swallow evalution in xray previously.     Pertinent Vitals WDL  SLP Plan  Continue with current plan of care    Recommendations Diet recommendations: Regular;Thin liquid Liquids provided via: Cup;Straw Medication Administration: Whole meds with liquid Supervision: Patient able to self feed;Intermittent supervision to cue for compensatory strategies Compensations: Slow rate;Small sips/bites Postural Changes and/or Swallow Maneuvers: Seated upright 90 degrees      Patient may use Passy-Muir Speech Valve: During all waking hours (remove during sleep) PMSV Supervision: Full MD:  Please consider changing trach tube to : Cuffless;Smaller size       Oral Care Recommendations: Oral care BID Follow up Recommendations: Skilled Nursing facility Plan: Continue with current plan of care    GO     Royce Macadamia M.Ed [employer] [phone]  01/05/2014

## 2014-01-06 ENCOUNTER — Inpatient Hospital Stay (HOSPITAL_COMMUNITY): Payer: Medicare Other

## 2014-01-06 ENCOUNTER — Encounter (HOSPITAL_COMMUNITY): Payer: Self-pay | Admitting: Anesthesiology

## 2014-01-06 DIAGNOSIS — J9600 Acute respiratory failure, unspecified whether with hypoxia or hypercapnia: Secondary | ICD-10-CM

## 2014-01-06 DIAGNOSIS — J930 Spontaneous tension pneumothorax: Secondary | ICD-10-CM

## 2014-01-06 DIAGNOSIS — I469 Cardiac arrest, cause unspecified: Secondary | ICD-10-CM

## 2014-01-06 DIAGNOSIS — J189 Pneumonia, unspecified organism: Secondary | ICD-10-CM

## 2014-01-06 DIAGNOSIS — J9620 Acute and chronic respiratory failure, unspecified whether with hypoxia or hypercapnia: Secondary | ICD-10-CM

## 2014-01-06 LAB — POCT I-STAT 3, ART BLOOD GAS (G3+)
Acid-base deficit: 1 mmol/L (ref 0.0–2.0)
BICARBONATE: 24.1 meq/L — AB (ref 20.0–24.0)
O2 Saturation: 100 %
PCO2 ART: 42.1 mmHg (ref 35.0–45.0)
TCO2: 25 mmol/L (ref 0–100)
pH, Arterial: 7.363 (ref 7.350–7.450)
pO2, Arterial: 225 mmHg — ABNORMAL HIGH (ref 80.0–100.0)

## 2014-01-06 LAB — POCT I-STAT, CHEM 8
BUN: 25 mg/dL — ABNORMAL HIGH (ref 6–23)
CALCIUM ION: 1.29 mmol/L (ref 1.13–1.30)
Chloride: 105 mEq/L (ref 96–112)
Creatinine, Ser: 3.1 mg/dL — ABNORMAL HIGH (ref 0.50–1.10)
Glucose, Bld: 89 mg/dL (ref 70–99)
HEMATOCRIT: 32 % — AB (ref 36.0–46.0)
Hemoglobin: 10.9 g/dL — ABNORMAL LOW (ref 12.0–15.0)
Potassium: 4.6 mEq/L (ref 3.7–5.3)
Sodium: 137 mEq/L (ref 137–147)
TCO2: 21 mmol/L (ref 0–100)

## 2014-01-06 LAB — COMPREHENSIVE METABOLIC PANEL
ALT: 5 U/L (ref 0–35)
AST: 8 U/L (ref 0–37)
Albumin: 2.4 g/dL — ABNORMAL LOW (ref 3.5–5.2)
Alkaline Phosphatase: 86 U/L (ref 39–117)
BUN: 26 mg/dL — ABNORMAL HIGH (ref 6–23)
CO2: 24 mEq/L (ref 19–32)
Calcium: 9.3 mg/dL (ref 8.4–10.5)
Chloride: 98 mEq/L (ref 96–112)
Creatinine, Ser: 2.98 mg/dL — ABNORMAL HIGH (ref 0.50–1.10)
GFR calc Af Amer: 18 mL/min — ABNORMAL LOW (ref >90)
GFR calc non Af Amer: 15 mL/min — ABNORMAL LOW (ref >90)
Glucose, Bld: 102 mg/dL — ABNORMAL HIGH (ref 70–99)
Potassium: 3.8 mEq/L (ref 3.7–5.3)
Sodium: 137 mEq/L (ref 137–147)
Total Bilirubin: 0.4 mg/dL (ref 0.3–1.2)
Total Protein: 5.4 g/dL — ABNORMAL LOW (ref 6.0–8.3)

## 2014-01-06 LAB — PROTIME-INR
INR: 1.22 (ref 0.00–1.49)
INR: 1.15 (ref 0.00–1.49)
PROTHROMBIN TIME: 14.7 s (ref 11.6–15.2)
Prothrombin Time: 15.4 seconds — ABNORMAL HIGH (ref 11.6–15.2)

## 2014-01-06 LAB — GLUCOSE, CAPILLARY
GLUCOSE-CAPILLARY: 274 mg/dL — AB (ref 70–99)
Glucose-Capillary: 107 mg/dL — ABNORMAL HIGH (ref 70–99)
Glucose-Capillary: 134 mg/dL — ABNORMAL HIGH (ref 70–99)
Glucose-Capillary: 183 mg/dL — ABNORMAL HIGH (ref 70–99)
Glucose-Capillary: 217 mg/dL — ABNORMAL HIGH (ref 70–99)

## 2014-01-06 LAB — CBC
HCT: 24.8 % — ABNORMAL LOW (ref 36.0–46.0)
HEMATOCRIT: 21.3 % — AB (ref 36.0–46.0)
HEMOGLOBIN: 7 g/dL — AB (ref 12.0–15.0)
HEMOGLOBIN: 7.6 g/dL — AB (ref 12.0–15.0)
MCH: 30.2 pg (ref 26.0–34.0)
MCH: 28.9 pg (ref 26.0–34.0)
MCHC: 32.9 g/dL (ref 30.0–36.0)
MCHC: 30.6 g/dL (ref 30.0–36.0)
MCV: 91.8 fL (ref 78.0–100.0)
MCV: 94.3 fL (ref 78.0–100.0)
PLATELETS: 260 10*3/uL (ref 150–400)
Platelets: 192 10*3/uL (ref 150–400)
RBC: 2.32 MIL/uL — ABNORMAL LOW (ref 3.87–5.11)
RBC: 2.63 MIL/uL — AB (ref 3.87–5.11)
RDW: 15.8 % — ABNORMAL HIGH (ref 11.5–15.5)
RDW: 16 % — ABNORMAL HIGH (ref 11.5–15.5)
WBC: 6.7 10*3/uL (ref 4.0–10.5)
WBC: 9.5 10*3/uL (ref 4.0–10.5)

## 2014-01-06 LAB — BASIC METABOLIC PANEL
BUN: 25 mg/dL — ABNORMAL HIGH (ref 6–23)
CO2: 22 mEq/L (ref 19–32)
Calcium: 10.3 mg/dL (ref 8.4–10.5)
Chloride: 99 mEq/L (ref 96–112)
Creatinine, Ser: 2.98 mg/dL — ABNORMAL HIGH (ref 0.50–1.10)
GFR calc Af Amer: 18 mL/min — ABNORMAL LOW (ref >90)
GFR, EST NON AFRICAN AMERICAN: 15 mL/min — AB (ref >90)
Glucose, Bld: 90 mg/dL (ref 70–99)
POTASSIUM: 4.8 meq/L (ref 3.7–5.3)
SODIUM: 140 meq/L (ref 137–147)

## 2014-01-06 LAB — TROPONIN I
TROPONIN I: 2.36 ng/mL — AB (ref <0.30)
TROPONIN I: 2.38 ng/mL — AB (ref <0.30)
Troponin I: <0.3 ng/mL (ref <0.30)
Troponin I: 1.07 ng/mL (ref <0.30)

## 2014-01-06 LAB — LACTIC ACID, PLASMA
LACTIC ACID, VENOUS: 6 mmol/L — AB (ref 0.5–2.2)
LACTIC ACID, VENOUS: 1.7 mmol/L (ref 0.5–2.2)
Lactic Acid, Venous: 5.5 mmol/L — ABNORMAL HIGH (ref 0.5–2.2)
Lactic Acid, Venous: 1 mmol/L (ref 0.5–2.2)

## 2014-01-06 LAB — HEPARIN LEVEL (UNFRACTIONATED)
Heparin Unfractionated: 0.51 IU/mL (ref 0.30–0.70)
Heparin Unfractionated: 0.98 IU/mL — ABNORMAL HIGH (ref 0.30–0.70)

## 2014-01-06 LAB — MAGNESIUM
MAGNESIUM: 1.7 mg/dL (ref 1.5–2.5)
MAGNESIUM: 1.8 mg/dL (ref 1.5–2.5)

## 2014-01-06 LAB — PHOSPHORUS
PHOSPHORUS: 5.1 mg/dL — AB (ref 2.3–4.6)
PHOSPHORUS: 4 mg/dL (ref 2.3–4.6)

## 2014-01-06 LAB — APTT: aPTT: 55 seconds — ABNORMAL HIGH (ref 24–37)

## 2014-01-06 MED ORDER — PRO-STAT SUGAR FREE PO LIQD
30.0000 mL | Freq: Two times a day (BID) | ORAL | Status: DC
  Administered 2014-01-06 – 2014-01-10 (×7): 30 mL
  Filled 2014-01-06 (×10): qty 30

## 2014-01-06 MED ORDER — INSULIN ASPART 100 UNIT/ML ~~LOC~~ SOLN
0.0000 [IU] | SUBCUTANEOUS | Status: DC
  Administered 2014-01-06: 3 [IU] via SUBCUTANEOUS
  Administered 2014-01-06: 4 [IU] via SUBCUTANEOUS
  Administered 2014-01-07 – 2014-01-10 (×7): 3 [IU] via SUBCUTANEOUS

## 2014-01-06 MED ORDER — MAGNESIUM SULFATE 40 MG/ML IJ SOLN
2.0000 g | Freq: Once | INTRAMUSCULAR | Status: AC
  Administered 2014-01-06: 2 g via INTRAVENOUS
  Filled 2014-01-06: qty 50

## 2014-01-06 MED ORDER — INSULIN ASPART 100 UNIT/ML ~~LOC~~ SOLN: 3.0000 [IU] | SUBCUTANEOUS | Status: DC

## 2014-01-06 MED ORDER — AMLODIPINE BESYLATE 10 MG PO TABS
10.0000 mg | ORAL_TABLET | Freq: Every day | ORAL | Status: DC
  Administered 2014-01-06 – 2014-01-10 (×4): 10 mg
  Filled 2014-01-06 (×5): qty 1

## 2014-01-06 MED ORDER — BIOTENE DRY MOUTH MT LIQD
15.0000 mL | Freq: Four times a day (QID) | OROMUCOSAL | Status: DC
  Administered 2014-01-07 – 2014-01-10 (×14): 15 mL via OROMUCOSAL

## 2014-01-06 MED ORDER — CARVEDILOL 25 MG PO TABS
25.0000 mg | ORAL_TABLET | Freq: Two times a day (BID) | ORAL | Status: DC
  Administered 2014-01-06 – 2014-01-07 (×3): 25 mg
  Filled 2014-01-06 (×6): qty 1

## 2014-01-06 MED ORDER — NICARDIPINE HCL IN NACL 20-0.86 MG/200ML-% IV SOLN
3.0000 mg/h | INTRAVENOUS | Status: DC
  Administered 2014-01-06: 3 mg/h via INTRAVENOUS
  Administered 2014-01-07: 5 mg/h via INTRAVENOUS
  Administered 2014-01-07: 3 mg/h via INTRAVENOUS
  Filled 2014-01-06 (×3): qty 200

## 2014-01-06 MED ORDER — WARFARIN SODIUM 5 MG PO TABS
5.0000 mg | ORAL_TABLET | Freq: Once | ORAL | Status: AC
  Administered 2014-01-06: 5 mg via ORAL
  Filled 2014-01-06: qty 1

## 2014-01-06 MED ORDER — VITAL HIGH PROTEIN PO LIQD
1000.0000 mL | ORAL | Status: DC
  Administered 2014-01-06 – 2014-01-09 (×2): 1000 mL
  Filled 2014-01-06 (×7): qty 1000

## 2014-01-06 MED ORDER — DEXTROSE 5 % IV SOLN
2.0000 ug/min | INTRAVENOUS | Status: DC
  Filled 2014-01-06: qty 16

## 2014-01-06 NOTE — Code Documentation (Signed)
  Patient Name: Sarah Tran   MRN: [id number]   Date of Birth/ Sex: January 21, 1946 , female      Admission Date: 01/22/14  Attending Provider: Alyson Reedy, MD  Primary Diagnosis: Respiratory Failure/PEA   Indication: Pt was in her usual state of health until this AM at 4:17, when she was noted to be coughing in respiratory distress with thick secretions (attempted to be suctioned) then unresponsive. She had a trach and was not on the ventilator.  Respiratory attempted to bag with resistance and patient developed subcutaneous air/crepitus.  Code blue was subsequently called. At the time of arrival on scene, ACLS protocol was underway.  Ultimately, two needles were placed in b/l chest for decompression.     Technical Description:  - CPR performance duration:  57 minutes (4:17 to 4:54 then resumed at 5:04 and still going at 5:24 am)   - Was defibrillation or cardioversion used? Y x  2   - Was external pacer placed? No   - Was patient intubated pre/post CPR? Intubated (ETT) during CPR (around 4:30 am by Dr. Noreene Larsson anesthesiology)   Medications Administered: Jeannie Fend = Yes; Blank = No Amiodarone  Y x 1  Atropine    Calcium  Y   Epinephrine  Y x 8  Lidocaine    Magnesium    Norepinephrine  Y  Phenylephrine    Sodium bicarbonate  Y x 4  Vasopressin    Dopamine @ 15   Post CPR evaluation:  - Final Status - Was patient successfully resuscitated ? Yes as of 5:24. HR 66 RR 19 BP 50/34 (39) - What is current rhythm? Initial rhythm was PEA then VF then NSR with temporary bradycardia (30s-40s) - What is current hemodynamic status? As of 5:04 BP was 50/34 (39)  Miscellaneous Information:  - Labs sent, including: ABG, CBC, BMET, lactic acid, Mag, Phos, Trop, Lactic acid   - Primary team notified?  yes  - Family Notified? yes  - Additional notes/ transfer status: Transferred to 2M13. Getting chest tube and central line    Update per patient's RN: Patient recently had colonoscopy w/ perforated bowel  then ostomy. She developed reaction to penicillin.  She developed respiratory failure and trach placed 6/19. Tuesday Trach size changed from 6 to 4.  6/24 patient had respiratory distress and trach was in pocket so changed trach size to 6.  6/26 there is a question if the trach dislodged.  She was planning to discharge 6/27.    Of note patient had history of pneumomediastinum a couple of days ago.    Patient was recently at outside hospital ? [facility] or [facility], [place]  [phone] 01/06/2014, 5:22 AM

## 2014-01-06 NOTE — Progress Notes (Signed)
CRITICAL VALUE ALERT  Critical value received:  Troponin 1.07  Date of notification:  6/26  Time of notification:  0845  Critical value read back:Yes.    Nurse who received alert:  Kathlen Mody RN  MD notified (1st page): Byrum MD  Time of first page:  Notified on 0900  MD notified (2nd page):  Time of second page:  Responding MD:  Delton Coombes MD  Time MD responded:  0900

## 2014-01-06 NOTE — Procedures (Signed)
Emergent chest tube thoracotomy on the L with gush of air and return of hemodynamic stability upon tube insertion.  PCXR ordered.  Lonia Farber, MD Pulmonary and Critical Care Medicine [facility] Pager: [phone]

## 2014-01-06 NOTE — Progress Notes (Signed)
ANTICOAGULATION CONSULT NOTE - Follow Up Consult  Pharmacy Consult for Heparin Indication: atrial fibrillation/hx of DVT  Allergies  Allergen Reactions  . Codeine Other (See Comments)    Pass out    . Ertapenem     From  Med list from [facility]   . Fish-Derived Products     From med list from [facility]  . Peanut Butter Flavor Hives and Other (See Comments)    Lockjaw   . Penicillins Swelling   Patient Measurements: Height: 5\' 2"  (157.5 cm) Weight: 239 lb (108.41 kg) IBW/kg (Calculated) : 50.1 Heparin Dosing Weight: 77 kg  Vital Signs: Temp: 97.3 F (36.3 C) (06/26 1258) Temp src: Oral (06/26 1258) BP: 200/116 mmHg (06/26 2000) Pulse Rate: 69 (06/26 1900)  Labs:  Recent Labs  01/05/14 0315 01/05/14 1257 01/06/14 0306 01/06/14 0417 01/06/14 0432 01/06/14 0636 01/06/14 1308 01/06/14 2043  HGB 7.4*  --  7.0* 7.6* 10.9*  --   --   --   HCT 22.7*  --  21.3* 24.8* 32.0*  --   --   --   PLT 198  --  192 260  --   --   --   --   APTT  --   --   --  55*  --   --   --   --   LABPROT 15.4*  --  15.4* 14.7  --   --   --   --   INR 1.22  --  1.22 1.15  --   --   --   --   HEPARINUNFRC 0.25* 0.54 0.51  --   --   --   --  0.98*  CREATININE  --   --  2.98* 2.98* 3.10*  --   --   --   TROPONINI  --   --   --  <0.30  --  1.07* 2.36*  --    Estimated Creatinine Clearance: 20.7 ml/min (by C-G formula based on Cr of 3.1).  Medications:  Heparin 1350 units/hr - held last night  Assessment: 68 y/o F continues on heparin and coumadin for afib/hx DVT. Heparin was held last evening due to respiratory arrest.  Warfarin was not given either.  Her troponin is elevated to 1.07 post CPR.  She had a chest tube placed and today she was resumed on her anticoagulation therapy earlier today. Heparin level this evening was elevated at 0.98 after restarting drip at previous therapeutic rate. RN reports no bleeding and that level was drawn appropriately from  arterial line.   Goal of Therapy:  Heparin level 0.3-0.7 units/ml Monitor platelets by anticoagulation protocol: Yes INR 2-3   Plan:  1. Decrease IV heparin gtt to 1200 units/hr 2. F/u AM heparin level, CBC and INR  Vinnie Level, PharmD.  Clinical Pharmacist Pager [phone]

## 2014-01-06 NOTE — Progress Notes (Signed)
Anesthesiology Intubation Note:  Asked to assist with intubation in 68 year old female who suffered cardiac arrest. Ventilation with ambu bag via pre-existing tracheostomy resulted in facial SubQ air. 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    Pt intubated with glide scope using subglottic suction ETT. Tracheotomy cannula removed. Bilateral BS distant. Resusitative efforts ongoing per CCM. Cords easily visualized with glide scope.  Sarah Tran

## 2014-01-06 NOTE — Procedures (Signed)
Central Venous Catheter Insertion Procedure Note Sarah Tran [id number] 1945-12-20  Procedure: Insertion of Central Venous Catheter Indications: Drug and/or fluid administration and Frequent blood sampling  Procedure Details Consent: Unable to obtain consent because of emergent medical necessity. Time Out: Verified patient identification, verified procedure, site/side was marked, verified correct patient position, special equipment/implants available, medications/allergies/relevent history reviewed, required imaging and test results available.  Performed  Maximum sterile technique was used including antiseptics, cap, gloves, gown, hand hygiene, mask and sheet. Skin prep: Chlorhexidine; local anesthetic administered A antimicrobial bonded/coated triple lumen catheter was placed in the left subclavian vein using the Seldinger technique. Ultrasound guidance used.Yes.   Catheter placed to 20 cm. Blood aspirated via all 3 ports and then flushed x 3. Line sutured x 2 and dressing applied.  Evaluation Blood flow good Complications: No apparent complications Patient did tolerate procedure well. Chest X-ray ordered to verify placement.  CXR: pending.  Joneen Roach, ACNP [facility] Pulmonology/Critical Care Pager [phone] or [phone]

## 2014-01-06 NOTE — Procedures (Signed)
Supervised and present through the entire procedure.  ZUBELEVITSKIY, KONSTANTIN, MD Pulmonary and Critical Care Medicine [facility] Pager: [phone]  

## 2014-01-06 NOTE — Progress Notes (Signed)
At approximately 0417 the pt. Began having difficulty breathing. RT was in the room and tried to lavage her. She began to have difficulty bagging her Rapid response was called and the pt coded.  We attempted resuscitation for approximately one hour and the pt was transferred to 2MW13.

## 2014-01-06 NOTE — Procedures (Signed)
Arterial Catheter Insertion Procedure Note Sarah Tran [id number] 1946-04-21  Procedure: Insertion of Arterial Catheter  Indications: Blood pressure monitoring  Procedure Details Consent: Unable to obtain consent because of emergent medical necessity. Time Out: Verified patient identification, verified procedure, site/side was marked, verified correct patient position, special equipment/implants available, medications/allergies/relevent history reviewed, required imaging and test results available.  Performed  Maximum sterile technique was used including antiseptics, cap, gloves, gown, hand hygiene, mask and sheet. Skin prep: Chlorhexidine; local anesthetic administered 20 gauge catheter was inserted into right femoral artery using the Seldinger technique.  Evaluation Blood flow good; BP tracing good. Complications: No apparent complications.  Joneen Roach, ACNP [facility] Pulmonology/Critical Care Pager [phone] or [phone]

## 2014-01-06 NOTE — Procedures (Signed)
Supervised and present through the entire procedure.  ZUBELEVITSKIY, KONSTANTIN, MD Pulmonary and Critical Care Medicine [facility] Pager: [phone]  

## 2014-01-06 NOTE — Progress Notes (Signed)
UR Completed.  Sarah Tran [phone] 01/06/2014

## 2014-01-06 NOTE — Progress Notes (Signed)
ANTICOAGULATION CONSULT NOTE - Follow Up Consult  Pharmacy Consult for Heparin + warfarin Indication: atrial fibrillation/hx of DVT  Allergies  Allergen Reactions  . Codeine Other (See Comments)    Pass out    . Ertapenem     From  Med list from [facility]   . Fish-Derived Products     From med list from [facility]  . Peanut Butter Flavor Hives and Other (See Comments)    Lockjaw   . Penicillins Swelling   Patient Measurements: Height: 5\' 2"  (157.5 cm) Weight: 239 lb (108.41 kg) IBW/kg (Calculated) : 50.1 Heparin Dosing Weight: 77 kg  Vital Signs: Temp: 97.4 F (36.3 C) (06/26 0853) Temp src: Oral (06/26 0853) BP: 97/47 mmHg (06/26 0800) Pulse Rate: 75 (06/26 0800)  Labs:  Recent Labs  01/05/14 0315 01/05/14 1257 01/06/14 0306 01/06/14 0417 01/06/14 0432 01/06/14 0636  HGB 7.4*  --  7.0* 7.6* 10.9*  --   HCT 22.7*  --  21.3* 24.8* 32.0*  --   PLT 198  --  192 260  --   --   APTT  --   --   --  55*  --   --   LABPROT 15.4*  --  15.4* 14.7  --   --   INR 1.22  --  1.22 1.15  --   --   HEPARINUNFRC 0.25* 0.54 0.51  --   --   --   CREATININE  --   --  2.98* 2.98* 3.10*  --   TROPONINI  --   --   --  <0.30  --  1.07*   Estimated Creatinine Clearance: 20.7 ml/min (by C-G formula based on Cr of 3.1).  Medications:  Heparin 1350 units/hr - held last night  Assessment: 68 y/o F continues on heparin and coumadin for afib/hx DVT. Heparin was held last evening due to respiratory arrest.  Warfarin was not given either.  Her troponin is elevated to 1.07 post CPR.  She had a chest tube placed and today she is being resumed on her anticoagulation therapy.  No noted bleeding complications and H/H is up today.  Goal of Therapy:  Heparin level 0.3-0.7 units/ml Monitor platelets by anticoagulation protocol: Yes INR 2-3   Plan:  1. Restart IV heparin gtt 1350 units/hr 2. Warfarin 5 mg PO x 1 tonight 3. F/u AM heparin level, CBC and  INR  Nadara Mustard, PharmD., MS Clinical Pharmacist Pager:  [phone] Thank you for allowing pharmacy to be part of this patients care team. 01/06/2014 11:11 AM

## 2014-01-06 NOTE — Progress Notes (Signed)
INITIAL NUTRITION ASSESSMENT  DOCUMENTATION CODES Per approved criteria  -Morbid Obesity   INTERVENTION: Utilize 32M PEPuP Protocol: initiate TF via NGT with Vital HP at 25 ml/h and Prostat 30 ml BID on day 1; on day 2, increase to goal rate of 45 ml/h (1080 ml per day) to provide 1280 kcals (70% of estimated kcal needs), 125 gm protein (100% of estimated protein needs), 903 ml free water daily RD to follow for nutrition care plan  NUTRITION DIAGNOSIS: Inadequate oral intake related to inability to eat as evidenced by NPO status  Goal: Enteral nutrition to provide 60-70% of estimated calorie needs (22-25 kcals/kg ideal body weight) and 100% of estimated protein needs, based on ASPEN guidelines for permissive underfeeding in critically ill obese individuals  Monitor:  TF regimen & tolerance, respiratory status, weight, labs, I/O's  Reason for Assessment: Consult  68 y.o. female  Admitting Dx: respiratory distress  ASSESSMENT: 68 yo with recent prolonged hospitalization including tracheostomy, ultimately decannulated and d/c'd 6/1. Presented to outside hospital on 6/16 for respiratory distress. Transferred to [facility]. 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 On 6/19 underwent tracheostomy by ENT. On 6/26 developed cardiac arrest likely secondary to respiratory event, resuscitated, brought to ICU.  Patient transferred from 2C-Stepdown to MICU 6/26.  Patient s/p procedure 6/26: EMERGENT CHEST TUBE THORACOTOMY  Patient is currently on ventilator support -- trach MV: 9.7 L/min Temp (24hrs), Avg:98.1 F (36.7 C), Min:97.4 F (36.3 C), Max:98.7 F (37.1 C)   NGT placement pending.  RD consulted for TF initiation & management.  Height: Ht Readings from Last 1 Encounters:  01/02/14 5\' 2"  (1.575 m)    Weight: Wt Readings from Last 1 Encounters:  01/05/14 239 lb (108.41 kg)    Ideal Body Weight: 110 lb  % Ideal Body Weight: 217%  Wt Readings from  Last 10 Encounters:  01/05/14 239 lb (108.41 kg)  01/05/14 239 lb (108.41 kg)  12/01/13 231 lb 11.3 oz (105.1 kg)  11/21/13 251 lb (113.853 kg)    Usual Body Weight: 231 lb  % Usual Body Weight: 103%  BMI:  Body mass index is 43.7 kg/(m^2).  Estimated Nutritional Needs: Kcal: 1807 Protein: 125-135 gm Fluid: per MD  Skin: Intact  Diet Order: NPO  EDUCATION NEEDS: -No education needs identified at this time   Intake/Output Summary (Last 24 hours) at 01/06/14 0936 Last data filed at 01/06/14 0800  Gross per 24 hour  Intake  310.5 ml  Output    350 ml  Net  -39.5 ml    Labs:   Recent Labs Lab 01/01/14 0320 01/03/14 0338 01/05/14 0005 01/06/14 0306 01/06/14 0417 01/06/14 0432  NA 140 140 135* 137 140 137  K 3.9 3.2* 3.7 3.8 4.8 4.6  CL 102 98 97 98 99 105  CO2 23 26 24 24 22   --   BUN 23 19 23  26* 25* 25*  CREATININE 1.78* 1.71* 2.53* 2.98* 2.98* 3.10*  CALCIUM 9.3 9.9 9.1 9.3 10.3  --   MG 1.8 1.7  --  1.7 1.8  --   PHOS 3.6 2.9  --   --  5.1*  --   GLUCOSE 104* 119* 133* 102* 90 89    CBG (last 3)   Recent Labs  01/05/14 2040 01/06/14 0037 01/06/14 0827  GLUCAP 118* 107* 274*    Scheduled Meds: . antiseptic oral rinse  15 mL Mouth Rinse q12n4p  . brimonidine  1 drop Both Eyes BID  . chlorhexidine  15 mL Mouth Rinse BID  . cholecalciferol  400 Units Oral Daily  . darbepoetin  40 mcg Subcutaneous Q Sat-1800  . ferrous sulfate  325 mg Oral TID WC  . magnesium sulfate 1 - 4 g bolus IVPB  2 g Intravenous Once  . methimazole  5 mg Oral Daily  . pantoprazole (PROTONIX) IV  40 mg Intravenous QHS  . senna-docusate  1 tablet Oral BID  . sodium bicarbonate  1,300 mg Oral BID  . timolol  1 drop Both Eyes BID  . warfarin  2.5 mg Oral ONCE-1800  . Warfarin - Pharmacist Dosing Inpatient   Does not apply q1800  . zinc sulfate  220 mg Oral Daily    Continuous Infusions: . heparin Stopped (01/06/14 0500)    Past Medical History  Diagnosis Date  .  Atrial fibrillation     chronic Coumadin  . Anemia of chronic disease   . HTN (hypertension)   . Morbid obesity   . Diabetes mellitus, type II   . Hyperthyroidism   . Hyperlipidemia   . CKD (chronic kidney disease), stage III   . DVT (deep venous thrombosis)     RLE; LUE; chronic Coumadin  . Glaucoma, bilateral   . Heart murmur   . OSA on CPAP   . History of blood transfusion ~ 2004; 11/2013    "while in hospital"  . H/O hiatal hernia   . Arthritis     "joints" (12/21/2013)  . Gout   . Anxiety     Past Surgical History  Procedure Laterality Date  . Exploratory laparotomy  09/2013    appy, diverting colostomy for sigmoid perf following removal of ileal polyp.   . Gastrostomy tube placement  10/2013  . Tracheostomy  10/2013    [facility]. Trach decannulated 11/18/2013 at [facility].  . Cholecystectomy    . Carpal tunnel release Right   . Appendectomy    . Vaginal hysterectomy      "partial"  . Cardiac catheterization    . Tracheostomy closure    . Tracheostomy tube placement N/A August 19, 2013    Procedure: TRACHEOSTOMY;  Surgeon: Flo ShanksKarol Wolicki, MD;  [facility];  Service: ENT;  Laterality: N/A;  . Laryngoscopy and bronchoscopy N/A August 19, 2013    Procedure: DIRECT LARYNGOSCOPY AND BRONCHOSCOPY;  Surgeon: Flo ShanksKarol Wolicki, MD;  [facility];  Service: ENT;  Laterality: N/A;    Maureen ChattersKatie Lamberton, RD, LDN Pager #: [phone] After-Hours Pager #: [phone]

## 2014-01-06 NOTE — Progress Notes (Signed)
PULMONARY / CRITICAL CARE MEDICINE  Name: Sarah Tran MRN: [id number] DOB: 1946/04/20    ADMISSION DATE:  01/03/2014 CONSULTATION DATE:  12/12/2013   REFERRING MD :  EDP PRIMARY SERVICE: PCCM  CHIEF COMPLAINT:  Respiratory Distress  BRIEF PATIENT DESCRIPTION: 68 yo with recent prolonged hospitalization including tracheostomy, ultimately decannulated and d/c'd 6/1. Presented to outside hospital on 6/16 for respiratory distress. Transferred to [facility].  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  On 6/19 underwent tracheostomy by ENT.  On 6/26 developed cardiac arrest likely secondary to respiratory event, resuscitated, brought to ICU.  SIGNIFICANT EVENTS / STUDIES:   6/17  Laryngoscopy by ENT >>> Normal vocal cords, tracheomalacia / stenosis 6/19 Tracheostomy by ENT 6/20  Cardiac arrest , likely secondary to respiratory event, resuscitated, bilateral needle thoracotomy during ACLS, tube thoracotomy L with air escaping and stabilization of vital signs   LINES / TUBES: Foley 6/16 >>> Trach (ENT) 6/19 >>> L chest tube 6/25 >>> L [place] CVL 6/25 >>>  CULTURES: Blood 6/16 >>> neg Urine 6/16 >>> neg  ANTIBIOTICS: Levaquin 6/16 >>> 6/17 Vancomycin 6/16 >>> 6/17  INTERVAL HISTORY:  Cardiac arrest , likely secondary to respiratory event, resuscitated, bilateral needle thoracotomy during ACLS, tube thoracotomy L with air escaping and stabilization of vital signs   VITAL SIGNS: Temp:  [98 F (36.7 C)-98.7 F (37.1 C)] 98.6 F (37 C) (06/26 0000) Pulse Rate:  [74-131] 108 (06/26 0529) Resp:  [10-84] 33 (06/26 0528) BP: (171-172)/(84-85) 172/85 mmHg (06/26 0023) SpO2:  [54 %-100 %] 54 % (06/26 0529) FiO2 (%):  [28 %] 28 % (06/26 0023)  HEMODYNAMICS:   VENTILATOR SETTINGS: Vent Mode:  [-]  FiO2 (%):  [28 %] 28 %  INTAKE / OUTPUT: Intake/Output     06/25 0701 - 06/26 0700   P.O. 358   I.V. (mL/kg) 217.5 (2)   Total Intake(mL/kg) 575.5 (5.3)   Net +575.5        Urine Occurrence 2 x   Stool Occurrence 1 x    PHYSICAL EXAMINATION: General: Mechanically ventilated via ETT Neuro: Unresponsive off sedation, cough / gag diminished HEENT: Periorbital [place] emphysema, tracheostomy site closed with occlusive dressing Cardiovascular: Regular, no murmurs Lungs: Bilateral diminished air entry Abdomen: Obese, soft, colostomy  Musculoskeletal: Trace edema Skin: Intact  LABS:  CBC  Recent Labs Lab 01/05/14 0315 01/06/14 0306 01/06/14 0417  WBC 6.6 6.7 9.5  HGB 7.4* 7.0* 7.6*  HCT 22.7* 21.3* 24.8*  PLT 198 192 260   Coag's  Recent Labs Lab 01/05/14 0315 01/06/14 0306 01/06/14 0417  APTT  --   --  55*  INR 1.22 1.22 1.15   BMET  Recent Labs Lab 01/05/14 0005 01/06/14 0306 01/06/14 0417  NA 135* 137 140  K 3.7 3.8 4.8  CL 97 98 99  CO2 24 24 22   BUN 23 26* 25*  CREATININE 2.53* 2.98* 2.98*  GLUCOSE 133* 102* 90   Electrolytes  Recent Labs Lab 01/01/14 0320 01/03/14 0338 01/05/14 0005 01/06/14 0306 01/06/14 0417  CALCIUM 9.3 9.9 9.1 9.3 10.3  MG 1.8 1.7  --  1.7 1.8  PHOS 3.6 2.9  --   --  5.1*   Sepsis Markers  Recent Labs Lab 01/06/14 0417  LATICACIDVEN 6.0*   ABG No results found for this basename: PHART, PCO2ART, PO2ART,  in the last 168 hours Liver Enzymes  Recent Labs Lab 01/05/14 0315 01/06/14 0306  AST 14 8  ALT 6 5  ALKPHOS 91 86  BILITOT 0.5 0.4  ALBUMIN 2.3* 2.4*   Cardiac Enzymes  Recent Labs Lab 01/06/14 0417  TROPONINI <0.30   Glucose  Recent Labs Lab 01/05/14 0418 01/05/14 0753 01/05/14 1157 01/05/14 1629 01/05/14 2040 01/06/14 0037  GLUCAP 117* 101* 129* 151* 118* 107*   IMAGING:   Dg Chest Port 1 View  01/06/2014   CLINICAL DATA:  Respiratory distress with tracheostomy, now intubated.  EXAM: PORTABLE CHEST - 1 VIEW  COMPARISON:  01/04/2014  FINDINGS: Interval removal of tracheostomy tube and placement of an endotracheal tube. Tip measures 4.5 cm above the carinal.  Shallow inspiration with elevation of the right hemidiaphragm. Heart size and pulmonary vascularity are normal. There is interval development of prominent subcutaneous emphysema throughout the neck and upper chest with probable pneumomediastinum. No definitive evidence of pneumothorax. However, the subcutaneous emphysema could obscure small pneumothoraces. Atelectasis in the left lung base similar to prior study.  IMPRESSION: Endotracheal tube tip measures 4.5 cm above the carina. Extensive subcutaneous emphysema in the neck and upper chest with probable pneumomediastinum. No definitive pneumothorax. Atelectasis in the left lung base.   Electronically Signed   By: Burman NievesWilliam  Stevens M.D.   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 VF ) in setting of respiratory compromise Shock post cardiac arrest H/o HTN, Dyslipidemia, AF, DVT P:  Goal MAP>65, HR>80 D/c Coreg, Clonidine, Hydralazine, Nitroglycerin Trend troponin / lactate Dopamine gtt Levophed gtt  RENAL A:   Acute on chronic kidney failure Hypomagnesemia  P:   Trend BMP Mg 2 Bicarbonate tabs  GASTROINTESTINAL A:   Dysphagia GERD Colostomy status P:   NPO Place gastric tube Start TF per Nutritionist Protonix  HEMATOLOGIC A:  Anemia of chronic disease Chronic anticoagulation for AF / DVT, subtherapeutic on transfer P:  Trend  CBC Heparin gtt Coumadin Aranesp Iron  INFECTIOUS A:   No evidence of infection P:   No intervention required  ENDOCRINE A:   Hyperthyroidism  P:   Methimazole as preadmission when able  NEUROLOGIC A:   Acute encephalopathy Possible anoxia P:   D/c Fentanyl D/c Ambien  I have personally obtained history, examined patient, evaluated and interpreted laboratory and imaging results, reviewed medical records, formulated assessment / plan and placed orders.  CRITICAL CARE:  The patient is critically ill with multiple organ systems failure and requires high complexity decision making for assessment and support, frequent evaluation and titration of therapies, application of advanced monitoring technologies and extensive interpretation of multiple databases. Critical Care Time devoted to patient care services described in this note is 90 minutes.   Lonia FarberZUBELEVITSKIY, KONSTANTIN, MD Pulmonary and Critical Care Medicine [facility] Pager: [phone]  01/06/2014, 6:28 AM

## 2014-01-06 NOTE — Progress Notes (Signed)
PULMONARY / CRITICAL CARE MEDICINE  Name: Sarah Tran MRN: [id number] DOB: 09-25-1945    ADMISSION DATE:  12/20/2013 CONSULTATION DATE:  12/15/2013   REFERRING MD :  EDP PRIMARY SERVICE: PCCM  CHIEF COMPLAINT:  Respiratory Distress  BRIEF PATIENT DESCRIPTION: 68 yo with recent prolonged hospitalization including tracheostomy, ultimately decannulated and d/c'd 6/1. Presented to outside hospital on 6/16 for respiratory distress. [facility]  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  On 6/19 underwent tracheostomy by ENT.  On 6/26 developed cardiac arrest likely secondary to respiratory event, resuscitated, brought to ICU.  SIGNIFICANT EVENTS / STUDIES:   6/17  Laryngoscopy by ENT >>> Normal vocal cords, tracheomalacia / stenosis 6/19 Tracheostomy by ENT 6/20  Cardiac arrest , likely secondary to respiratory event, resuscitated, bilateral needle thoracotomy during ACLS, tube thoracotomy L with air escaping and stabilization of vital signs   LINES / TUBES: Foley 6/16 >>> Trach (ENT) 6/19 >>> L chest tube 6/25 >>> L [place] CVL 6/25 >>>  CULTURES: Blood 6/16 >>> neg Urine 6/16 >>> neg  ANTIBIOTICS: Levaquin 6/16 >>> 6/17 Vancomycin 6/16 >>> 6/17  INTERVAL HISTORY:  Cardiac arrest , likely secondary to respiratory event, resuscitated, bilateral needle thoracotomy during ACLS, tube thoracotomy L with air escaping and stabilization of vital signs   VITAL SIGNS: Temp:  [97.4 F (36.3 C)-98.7 F (37.1 C)] 97.4 F (36.3 C) (06/26 0853) Pulse Rate:  [75-131] 75 (06/26 0800) Resp:  [10-84] 19 (06/26 0800) BP: (97-172)/(45-85) 97/47 mmHg (06/26 0800) SpO2:  [54 %-100 %] 100 % (06/26 0800) FiO2 (%):  [28 %-100 %] 50 % (06/26 0756)  HEMODYNAMICS:   VENTILATOR SETTINGS: Vent Mode:  [-] PRVC FiO2 (%):  [28 %-100 %] 50 % Set Rate:  [14 bmp] 14 bmp Vt Set:  [450 mL] 450 mL PEEP:  [5 cmH20] 5 cmH20 Plateau Pressure:  [24 cmH20] 24 cmH20  INTAKE /  OUTPUT: Intake/Output     06/25 0701 - 06/26 0700 06/26 0701 - 06/27 0700   P.O. 358    I.V. (mL/kg) 217.5 (2)    Total Intake(mL/kg) 575.5 (5.3)    Urine (mL/kg/hr)  45 (0.1)   Stool  350 (0.7)   Total Output   395   Net +575.5 -395        Urine Occurrence 2 x    Stool Occurrence 1 x     PHYSICAL EXAMINATION: General: Mechanically ventilated via ETT Neuro: Unresponsive off sedation, cough / gag diminished HEENT: Periorbital [place] emphysema, tracheostomy site closed with occlusive dressing Cardiovascular: Regular, no murmurs Lungs: Bilateral diminished air entry, left chest without air leak Abdomen: Obese, soft, colostomy  Musculoskeletal: Trace edema Skin: Intact  LABS:  CBC  Recent Labs Lab 01/05/14 0315 01/06/14 0306 01/06/14 0417 01/06/14 0432  WBC 6.6 6.7 9.5  --   HGB 7.4* 7.0* 7.6* 10.9*  HCT 22.7* 21.3* 24.8* 32.0*  PLT 198 192 260  --    Coag's  Recent Labs Lab 01/05/14 0315 01/06/14 0306 01/06/14 0417  APTT  --   --  55*  INR 1.22 1.22 1.15   BMET  Recent Labs Lab 01/05/14 0005 01/06/14 0306 01/06/14 0417 01/06/14 0432  NA 135* 137 140 137  K 3.7 3.8 4.8 4.6  CL 97 98 99 105  CO2 24 24 22   --   BUN 23 26* 25* 25*  CREATININE 2.53* 2.98* 2.98* 3.10*  GLUCOSE 133* 102* 90 89   Electrolytes  Recent Labs Lab 01/01/14 0320 01/03/14  1610 01/05/14 0005 01/06/14 0306 01/06/14 0417  CALCIUM 9.3 9.9 9.1 9.3 10.3  MG 1.8 1.7  --  1.7 1.8  PHOS 3.6 2.9  --   --  5.1*   Sepsis Markers  Recent Labs Lab 01/06/14 0417 01/06/14 0636  LATICACIDVEN 6.0* 5.5*   ABG  Recent Labs Lab 01/06/14 0747  PHART 7.363  PCO2ART 42.1  PO2ART 225.0*   Liver Enzymes  Recent Labs Lab 01/05/14 0315 01/06/14 0306  AST 14 8  ALT 6 5  ALKPHOS 91 86  BILITOT 0.5 0.4  ALBUMIN 2.3* 2.4*   Cardiac Enzymes  Recent Labs Lab 01/06/14 0417 01/06/14 0636  TROPONINI <0.30 1.07*   Glucose  Recent Labs Lab 01/05/14 0753 01/05/14 1157  01/05/14 1629 01/05/14 2040 01/06/14 0037 01/06/14 0827  GLUCAP 101* 129* 151* 118* 107* 274*   IMAGING:   Dg Chest Portable 1 View  01/06/2014   CLINICAL DATA:  Intubation. Left chest tube and left central line placement.  EXAM: PORTABLE CHEST - 1 VIEW  COMPARISON:  01/05/2014  FINDINGS: Endotracheal tube tip measures 4.5 cm above the carinal. Interval placement of a left central venous catheter with tip over the cavoatrial junction. Interval placement of a left chest tube. Extensive subcutaneous emphysema again demonstrated in the neck and upper chest. Mediastinal emphysema. No definite pneumothorax although subcutaneous emphysema could easily obscure pneumothoraces. Shallow inspiration. There appears to be developing focal infiltration or atelectasis in the right mid lung. Heart size is normal.  IMPRESSION: Appliances appear in satisfactory location. Extensive subcutaneous emphysema in the neck and chest with mediastinal emphysema. Developing focal infiltration or atelectasis in the right mid lung.  Results were telephoned to the floor nurse at 0640 hr on 01/06/2014 as requested.   Electronically Signed   By: Burman Nieves M.D.   On: 01/06/2014 06:40   Dg Chest Port 1 View  01/06/2014   CLINICAL DATA:  Respiratory distress with tracheostomy, now intubated.  EXAM: PORTABLE CHEST - 1 VIEW  COMPARISON:  01/04/2014  FINDINGS: Interval removal of tracheostomy tube and placement of an endotracheal tube. Tip measures 4.5 cm above the carinal. Shallow inspiration with elevation of the right hemidiaphragm. Heart size and pulmonary vascularity are normal. There is interval development of prominent subcutaneous emphysema throughout the neck and upper chest with probable pneumomediastinum. No definitive evidence of pneumothorax. However, the subcutaneous emphysema could obscure small pneumothoraces. Atelectasis in the left lung base similar to prior study.  IMPRESSION: Endotracheal tube tip measures 4.5 cm  above the carina. Extensive subcutaneous emphysema in the neck and upper chest with probable pneumomediastinum. No definitive pneumothorax. Atelectasis in the left lung base.   Electronically Signed   By: Burman Nieves M.D.   On: 01/06/2014 05:22   Dg Abd Portable 1v  01/06/2014   CLINICAL DATA:  OG tube placement  EXAM: PORTABLE ABDOMEN - 1 VIEW  COMPARISON:  Abdominal 04/08/2014.  FINDINGS: An enteric tube is coiled within a large hiatal hernia. There has been increase in the amount of tubing within the hiatal hernia and otherwise stable. Chest tube within the right lung base. Air within nondilated loops of bowel.  IMPRESSION: Enteric tube coiled and a large hiatal hernia.   Electronically Signed   By: Salome Holmes M.D.   On: 01/06/2014 11:17   Dg Abd Portable 1v  01/06/2014   CLINICAL DATA:  OG tube placement  EXAM: PORTABLE ABDOMEN - 1 VIEW  COMPARISON:  None.  FINDINGS: Large hiatal hernia with the tip  of the orogastric tube coiled within the herniated gastric fundus. There is no bowel dilatation to suggest obstruction. There is no evidence of pneumoperitoneum, portal venous gas or pneumatosis. There are no pathologic calcifications along the expected course of the ureters.The osseous structures are unremarkable.  Left-sided chest tube again noted.  IMPRESSION: Large hiatal hernia with the tip of the orogastric tube coiled within the herniated gastric fundus.   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 Decreased bs on right via exam  CARDIOVASCULAR A:  Cardiac arrest ( bradycardic, PEA. VF ) in setting of respiratory compromise Shock post cardiac arrest H/o HTN, Dyslipidemia, AF, DVT P:  Goal MAP>65, HR>80 D/c Coreg, Clonidine, Hydralazine, Nitroglycerin Trend troponin / lactate Dopamine gtt Levophed gtt  RENAL A:   Acute on chronic kidney failure Hypomagnesemia  P:   Trend BMP Mg 2 Bicarbonate tabs  GASTROINTESTINAL A:   Dysphagia GERD Colostomy status P:   NPO Place gastric tube Start TF per Nutritionist Protonix  HEMATOLOGIC A:  Anemia of chronic disease Chronic anticoagulation for AF / DVT, subtherapeutic on transfer P:  Trend CBC Heparin gtt Coumadin Aranesp Iron  INFECTIOUS A:   No evidence of infection P:   No intervention required  ENDOCRINE A:   Hyperthyroidism  P:   Methimazole as preadmission when able  NEUROLOGIC A:   Acute encephalopathy Possible anoxia P:   CT head D/c Fentanyl D/c Ambien  Brett Canales Minor ACNP [facility] Pager [phone] till 3 pm If no answer page [phone] 01/06/2014, 12:03 PM  40 minutes CC time  Levy Pupa, MD, PhD 01/06/2014, 2:27 PM [place] [facility] [phone] or if no answer [phone]

## 2014-01-06 NOTE — Progress Notes (Signed)
eLink Physician-Brief Progress Note Patient Name: [name] DOB: 1945/10/09 MRN: [id number]  Date of Service  01/06/2014   HPI/Events of Note   Ischemia and edema but no bleed in the head CT.   HTN  eICU Interventions  Cardene drip.   Intervention Category Major Interventions: Acid-Base disturbance - evaluation and management;Hypertension - evaluation and management  YACOUB,WESAM 01/06/2014, 7:59 PM

## 2014-01-07 ENCOUNTER — Inpatient Hospital Stay (HOSPITAL_COMMUNITY): Payer: Medicare Other

## 2014-01-07 DIAGNOSIS — R4020 Unspecified coma: Secondary | ICD-10-CM

## 2014-01-07 DIAGNOSIS — Z9911 Dependence on respirator [ventilator] status: Secondary | ICD-10-CM

## 2014-01-07 DIAGNOSIS — G931 Anoxic brain damage, not elsewhere classified: Secondary | ICD-10-CM

## 2014-01-07 DIAGNOSIS — Z930 Tracheostomy status: Secondary | ICD-10-CM

## 2014-01-07 LAB — CBC
HCT: 25.6 % — ABNORMAL LOW (ref 36.0–46.0)
Hemoglobin: 8.4 g/dL — ABNORMAL LOW (ref 12.0–15.0)
MCH: 29.5 pg (ref 26.0–34.0)
MCHC: 32.8 g/dL (ref 30.0–36.0)
MCV: 89.8 fL (ref 78.0–100.0)
Platelets: 176 10*3/uL (ref 150–400)
RBC: 2.85 MIL/uL — ABNORMAL LOW (ref 3.87–5.11)
RDW: 15.6 % — AB (ref 11.5–15.5)
WBC: 10.1 10*3/uL (ref 4.0–10.5)

## 2014-01-07 LAB — PHOSPHORUS
PHOSPHORUS: 4.1 mg/dL (ref 2.3–4.6)
Phosphorus: 4 mg/dL (ref 2.3–4.6)
Phosphorus: 3.8 mg/dL (ref 2.3–4.6)

## 2014-01-07 LAB — GLUCOSE, CAPILLARY
GLUCOSE-CAPILLARY: 116 mg/dL — AB (ref 70–99)
GLUCOSE-CAPILLARY: 125 mg/dL — AB (ref 70–99)
Glucose-Capillary: 106 mg/dL — ABNORMAL HIGH (ref 70–99)
Glucose-Capillary: 114 mg/dL — ABNORMAL HIGH (ref 70–99)
Glucose-Capillary: 123 mg/dL — ABNORMAL HIGH (ref 70–99)
Glucose-Capillary: 131 mg/dL — ABNORMAL HIGH (ref 70–99)

## 2014-01-07 LAB — MAGNESIUM: MAGNESIUM: 2.2 mg/dL (ref 1.5–2.5)

## 2014-01-07 LAB — HEPARIN LEVEL (UNFRACTIONATED)
HEPARIN UNFRACTIONATED: 0.94 [IU]/mL — AB (ref 0.30–0.70)
HEPARIN UNFRACTIONATED: 0.82 [IU]/mL — AB (ref 0.30–0.70)
Heparin Unfractionated: 0.47 IU/mL (ref 0.30–0.70)

## 2014-01-07 LAB — BASIC METABOLIC PANEL
BUN: 37 mg/dL — ABNORMAL HIGH (ref 6–23)
CALCIUM: 9.3 mg/dL (ref 8.4–10.5)
CO2: 25 mEq/L (ref 19–32)
Chloride: 102 mEq/L (ref 96–112)
Creatinine, Ser: 3.6 mg/dL — ABNORMAL HIGH (ref 0.50–1.10)
GFR calc non Af Amer: 12 mL/min — ABNORMAL LOW (ref >90)
GFR, EST AFRICAN AMERICAN: 14 mL/min — AB (ref >90)
GLUCOSE: 116 mg/dL — AB (ref 70–99)
Potassium: 3.3 mEq/L — ABNORMAL LOW (ref 3.7–5.3)
Sodium: 145 mEq/L (ref 137–147)

## 2014-01-07 LAB — POCT I-STAT 3, ART BLOOD GAS (G3+)
ACID-BASE EXCESS: 6 mmol/L — AB (ref 0.0–2.0)
Bicarbonate: 28.9 mEq/L — ABNORMAL HIGH (ref 20.0–24.0)
O2 SAT: 99 %
PCO2 ART: 31.9 mmHg — AB (ref 35.0–45.0)
Patient temperature: 98.4
TCO2: 30 mmol/L (ref 0–100)
pH, Arterial: 7.565 — ABNORMAL HIGH (ref 7.350–7.450)
pO2, Arterial: 131 mmHg — ABNORMAL HIGH (ref 80.0–100.0)

## 2014-01-07 LAB — PROTIME-INR
INR: 1.55 — AB (ref 0.00–1.49)
PROTHROMBIN TIME: 18.6 s — AB (ref 11.6–15.2)

## 2014-01-07 MED ORDER — WARFARIN SODIUM 2.5 MG PO TABS
2.5000 mg | ORAL_TABLET | Freq: Once | ORAL | Status: AC
  Administered 2014-01-07: 2.5 mg via ORAL
  Filled 2014-01-07: qty 1

## 2014-01-07 MED ORDER — POTASSIUM CHLORIDE 20 MEQ/15ML (10%) PO LIQD
40.0000 meq | Freq: Once | ORAL | Status: AC
  Administered 2014-01-07: 40 meq
  Filled 2014-01-07: qty 30

## 2014-01-07 MED FILL — Medication: Qty: 2 | Status: AC

## 2014-01-07 NOTE — Progress Notes (Signed)
  Recent Labs Lab 01/01/14 0320 01/03/14 9295 01/05/14 0005 01/06/14 0306 01/06/14 0417 01/06/14 0432 01/06/14 2155 01/07/14 0430  NA 140 140 135* 137 140 137  --  145  K 3.9 3.2* 3.7 3.8 4.8 4.6  --  3.3*  CL 102 98 97 98 99 105  --  102  CO2 23 26 24 24 22   --   --  25  GLUCOSE 104* 119* 133* 102* 90 89  --  116*  BUN 23 19 23  26* 25* 25*  --  37*  CREATININE 1.78* 1.71* 2.53* 2.98* 2.98* 3.10*  --  3.60*  CALCIUM 9.3 9.9 9.1 9.3 10.3  --   --  9.3  MG 1.8 1.7  --  1.7 1.8  --   --  2.2  PHOS 3.6 2.9  --   --  5.1*  --  4.0 4.0   Low K  Plan kcl via tube   Dr. Kalman Shan, M.D., [facility]P Pulmonary and Critical Care Medicine Staff Physician [facility] [place] [facility] Pager: [phone], If no answer or between  15:00h - 7:00h: call [phone]  01/07/2014 2:14 PM

## 2014-01-07 NOTE — Progress Notes (Signed)
ANTICOAGULATION CONSULT NOTE  Pharmacy Consult for Heparin  Indication: atrial fibrillation, hx of DVT  Allergies  Allergen Reactions  . Codeine Other (See Comments)    Pass out    . Ertapenem     From  Med list from [facility]   . Fish-Derived Products     From med list from [facility]  . Peanut Butter Flavor Hives and Other (See Comments)    Lockjaw   . Penicillins Swelling   Patient Measurements: Height: 5\' 2"  (157.5 cm) Weight: 246 lb 4.1 oz (111.7 kg) IBW/kg (Calculated) : 50.1 Heparin Dosing Weight: 77 kg  Vital Signs: Temp: 98.6 F (37 C) (06/27 1958) Temp src: Oral (06/27 1958) BP: 137/80 mmHg (06/27 2100) Pulse Rate: 83 (06/27 2034)  Labs:  Recent Labs  01/06/14 0306  01/06/14 0417 01/06/14 0432 01/06/14 0636 01/06/14 1308  01/06/14 2155 01/07/14 0430 01/07/14 1445 01/07/14 2243  HGB 7.0*  --  7.6* 10.9*  --   --   --   --  8.4*  --   --   HCT 21.3*  --  24.8* 32.0*  --   --   --   --  25.6*  --   --   PLT 192  --  260  --   --   --   --   --  176  --   --   APTT  --   --  55*  --   --   --   --   --   --   --   --   LABPROT 15.4*  --  14.7  --   --   --   --   --  18.6*  --   --   INR 1.22  --  1.15  --   --   --   --   --  1.55*  --   --   HEPARINUNFRC 0.51  --   --   --   --   --   < >  --  0.94* 0.82* 0.47  CREATININE 2.98*  --  2.98* 3.10*  --   --   --   --  3.60*  --   --   TROPONINI  --   < > <0.30  --  1.07* 2.36*  --  2.38*  --   --   --   < > = values in this interval not displayed.  Estimated Creatinine Clearance: 18.1 ml/min (by C-G formula based on Cr of 3.6).  Assessment: 68 y/o Female with h/o afib/DVT for heparin  Goal of Therapy:  Heparin level 0.3-0.7 units/ml Monitor platelets by anticoagulation protocol: Yes   Plan: Continue Heparin at current rate Follow-up am labs.  Geannie Risen, PharmD, BCPS  01/07/2014,11:30 PM

## 2014-01-07 NOTE — Progress Notes (Signed)
Additional discussion with family - to include sister, niece, family friends at family request.  One friend is an ICU Char[employer] fundraiser.  Updated on details of clinical status.  Encouraged family to discuss further CPR in the event of arrest and considerations regarding ventilator support in the event she does not wake / have improvement in neurological status.     Time spent with family ~ 45 minutes  Canary Brim, NP-C [place] [facility] Pgr: [phone] or [phone]

## 2014-01-07 NOTE — Progress Notes (Signed)
PULMONARY / CRITICAL CARE MEDICINE  Name: Sarah Tran MRN: [id number] DOB: May 14, 1946    ADMISSION DATE:  January 05, 2014 CONSULTATION DATE:  2014/01/05   REFERRING MD :  EDP PRIMARY SERVICE: PCCM  CHIEF COMPLAINT:  Respiratory Distress  BRIEF PATIENT DESCRIPTION: 68 y/o with recent prolonged hospitalization including tracheostomy, ultimately decannulated and d/c'd 6/1. Presented to outside hospital on 6/16 for respiratory distress. Transferred to [facility].  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  On 6/19 underwent tracheostomy by ENT.  On 6/26 developed cardiac arrest likely secondary to respiratory event, resuscitated, brought to ICU.  SIGNIFICANT EVENTS / STUDIES:   6/17  Laryngoscopy by ENT >>> Normal vocal cords, tracheomalacia / stenosis 6/19 Tracheostomy by ENT 6/26 Cardiac arrest , likely secondary to respiratory event, resuscitated, bilateral needle thoracotomy during ACLS, tube thoracotomy L with air escaping and stabilization of vital signs  6/28 - check Neuron Specific Enolase  LINES / TUBES: Foley 6/16 >>> Trach (ENT) 6/19 >>> L chest tube 6/25 >>> L [place] CVL 6/25 >>>  CULTURES: Blood 6/16 >>> neg Urine 6/16 >>> neg  ANTIBIOTICS: Levaquin 6/16 >>> 6/17 Vancomycin 6/16 >>> 6/17  INTERVAL HISTORY:  Hypertensive overnight, started on cardene gtt.  RN reports no further acute events.   VITAL SIGNS: Temp:  [95 F (35 C)-98.9 F (37.2 C)] 98.6 F (37 C) (06/27 0800) Pulse Rate:  [56-93] 86 (06/27 1200) Resp:  [14-34] 24 (06/27 1200) BP: (133-200)/(63-119) 141/66 mmHg (06/27 1200) SpO2:  [100 %] 100 % (06/27 1200) FiO2 (%):  [40 %] 40 % (06/27 0857) Weight:  [246 lb 4.1 oz (111.7 kg)] 246 lb 4.1 oz (111.7 kg) (06/27 0400)  HEMODYNAMICS:   VENTILATOR SETTINGS: Vent Mode:  [-] PRVC FiO2 (%):  [40 %] 40 % Set Rate:  [14 bmp] 14 bmp Vt Set:  [450 mL] 450 mL PEEP:  [5 cmH20] 5 cmH20 Plateau Pressure:  [11 cmH20-21 cmH20] 11 cmH20  INTAKE /  OUTPUT: Intake/Output     06/26 0701 - 06/27 0700 06/27 0701 - 06/28 0700   P.O.     I.V. (mL/kg) 707.6 (6.3) 315.3 (2.8)   NG/GT 508    Total Intake(mL/kg) 1215.6 (10.9) 315.3 (2.8)   Urine (mL/kg/hr) 770 (0.3) 310 (0.5)   Emesis/NG output 500 (0.2)    Stool 800 (0.3)    Chest Tube 26 (0)    Total Output 2096 310   Net -880.4 +5.3         PHYSICAL EXAMINATION: General: Mechanically ventilated via ETT Neuro: Unresponsive off sedation, cough / gag diminished HEENT: reduced periorbital [place] emphysema, tracheostomy site closed with occlusive dressing, ETT in place Cardiovascular: Regular, no murmurs Lungs: Bilateral diminished air entry, left chest without air leak  Abdomen: Obese, soft, colostomy  Musculoskeletal: Trace edema Skin: Intact  LABS: PULMONARY  Recent Labs Lab 01/06/14 0432 01/06/14 0747 01/07/14 0444  PHART  --  7.363 7.565*  PCO2ART  --  42.1 31.9*  PO2ART  --  225.0* 131.0*  HCO3  --  24.1* 28.9*  TCO2 21 25 30   O2SAT  --  100.0 99.0    CBC  Recent Labs Lab 01/06/14 0306 01/06/14 0417 01/06/14 0432 01/07/14 0430  HGB 7.0* 7.6* 10.9* 8.4*  HCT 21.3* 24.8* 32.0* 25.6*  WBC 6.7 9.5  --  10.1  PLT 192 260  --  176    COAGULATION  Recent Labs Lab 01/03/14 2050 01/05/14 0315 01/06/14 0306 01/06/14 0417 01/07/14 0430  INR 1.27 1.22 1.22 1.15  1.55*    CARDIAC   Recent Labs Lab 01/06/14 0417 01/06/14 0636 01/06/14 1308 01/06/14 2155  TROPONINI <0.30 1.07* 2.36* 2.38*   No results found for this basename: PROBNP,  in the last 168 hours   CHEMISTRY  Recent Labs Lab 01/01/14 0320 01/03/14 0338 01/05/14 0005 01/06/14 0306 01/06/14 0417 01/06/14 0432 01/06/14 2155 01/07/14 0430  NA 140 140 135* 137 140 137  --  145  K 3.9 3.2* 3.7 3.8 4.8 4.6  --  3.3*  CL 102 98 97 98 99 105  --  102  CO2 23 26 24 24 22   --   --  25  GLUCOSE 104* 119* 133* 102* 90 89  --  116*  BUN 23 19 23  26* 25* 25*  --  37*  CREATININE 1.78* 1.71*  2.53* 2.98* 2.98* 3.10*  --  3.60*  CALCIUM 9.3 9.9 9.1 9.3 10.3  --   --  9.3  MG 1.8 1.7  --  1.7 1.8  --   --  2.2  PHOS 3.6 2.9  --   --  5.1*  --  4.0 4.0   Estimated Creatinine Clearance: 18.1 ml/min (by C-G formula based on Cr of 3.6).   LIVER  Recent Labs Lab 01/03/14 2050 01/05/14 0315 01/06/14 0306 01/06/14 0417 01/07/14 0430  AST  --  14 8  --   --   ALT  --  6 5  --   --   ALKPHOS  --  91 86  --   --   BILITOT  --  0.5 0.4  --   --   PROT  --  5.4* 5.4*  --   --   ALBUMIN  --  2.3* 2.4*  --   --   INR 1.27 1.22 1.22 1.15 1.55*     INFECTIOUS  Recent Labs Lab 01/06/14 0636 01/06/14 1308 01/06/14 2200  LATICACIDVEN 5.5* 1.7 1.0     ENDOCRINE CBG (last 3)   Recent Labs  01/07/14 0357 01/07/14 0728 01/07/14 1133  GLUCAP 106* 114* 131*         IMAGING x48h  Ct Head Wo Contrast  01/06/2014   CLINICAL DATA:  Status post cardiac arrest.  EXAM: CT HEAD WITHOUT CONTRAST  TECHNIQUE: Contiguous axial images were obtained from the base of the skull through the vertex without intravenous contrast.  COMPARISON:  10/28/2013  FINDINGS: Compared to the prior CT, there is new diffuse, partial sulcal and ventricular effacement. There is no midline shift. Patchy hypodensities are present throughout both cerebral hemispheres involving both gray and white matter. The most prominent areas of confluent hypoattenuation involve cortex in the left greater than right occipital lobes. Basilar cisterns remain patent. New, patchy hypodensities are also present in the cerebellum. 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 There is no evidence acute intracranial hemorrhage or extra-axial fluid collection.  Orbits are unremarkable. Mastoid air cells are clear. Minimal sphenoid sinus mucosal thickening is noted. Diffuse soft tissue emphysema is noted in the periportal region and visualized upper neck.  IMPRESSION: New, partial diffuse sulcal and ventricular  effacement with patchy hypoattenuation throughout both cerebral and cerebellar hemispheres, consistent with edema and sequelae of hypoxic ischemic injury, with greatest involvement of the left occipital lobe. No midline shift or acute intracranial hemorrhage.  These results were called by telephone at the time of interpretation on 01/06/2014 at 7:54 PM to patient's nurse, Jamesetta So, who verbally acknowledged these results.   Electronically  Signed   By: Sebastian Ache   On: 01/06/2014 19:56   Dg Chest Port 1 View  01/07/2014   CLINICAL DATA:  Evaluate endotracheal tube placement.  EXAM: PORTABLE CHEST - 1 VIEW  COMPARISON:  Chest x-ray 01/06/2014.  FINDINGS: An endotracheal tube is in place with tip 2.1 cm above the carina. There is a left-sided subclavian central venous catheter with tip terminating in the distal superior vena cava. Nasogastric tube is coiled in the patient's large hiatal hernia, but ultimately extends into the stomach. Left-sided chest tube in position with tip in the medial aspect of the left hemithorax. No pneumothorax. Previously noted pneumomediastinum is slightly less evident. There is a small amount of subcutaneous emphysema in the left chest wall tracking in the left cervical region. Lung volumes are low. Patchy areas of interstitial prominence an ill-defined airspace disease are noted throughout the left lung, similar to the recent prior study. Right lung appears clear. No pleural effusions. No evidence of pulmonary edema. Mild cardiomegaly. Upper mediastinal contours are distorted by patient's rotation to the right.  IMPRESSION: 1. Support apparatus, as above. 2. Persistent but decreasing pneumomediastinum. 3. Patchy interstitial and airspace opacities throughout the left lung appear unchanged. 4. Mild cardiomegaly.   Electronically Signed   By: Trudie Reed M.D.   On: 01/07/2014 08:13   Dg Chest Port 1 View  01/06/2014   CLINICAL DATA:  Subcutaneous emphysema, chest tube,  question pneumothorax  EXAM: PORTABLE CHEST - 1 VIEW  COMPARISON:  Portable exam 1214 hr compared to 0622 hr  FINDINGS: Tip of endotracheal tube projects 2.3 cm above carina.  Nasogastric tube coiled in large hiatal hernia before entering abdomen/stomach.  LEFT subclavian central venous catheter with tip projecting over SVC.  LEFT thoracostomy tube present.  Stable heart size and pulmonary vascularity.  Pneumomediastinum again identified at cardiac apex and adjacent to aortic arch.  Scattered atelectasis and infiltrate.  No definite pleural effusion or pneumothorax.  Chest wall emphysema on LEFT with subcutaneous emphysema in the cervical regions bilaterally.  IMPRESSION: Persistent visualization of pneumomediastinum and chest wall/cervical emphysema.  LEFT thoracostomy tube without definite pneumothorax identified.  Scattered pulmonary infiltrates and atelectasis little changed.   Electronically Signed   By: Ulyses Southward M.D.   On: 01/06/2014 12:33   Dg Chest Portable 1 View  01/06/2014   CLINICAL DATA:  Intubation. Left chest tube and left central line placement.  EXAM: PORTABLE CHEST - 1 VIEW  COMPARISON:  01/05/2014  FINDINGS: Endotracheal tube tip measures 4.5 cm above the carinal. Interval placement of a left central venous catheter with tip over the cavoatrial junction. Interval placement of a left chest tube. Extensive subcutaneous emphysema again demonstrated in the neck and upper chest. Mediastinal emphysema. No definite pneumothorax although subcutaneous emphysema could easily obscure pneumothoraces. Shallow inspiration. There appears to be developing focal infiltration or atelectasis in the right mid lung. Heart size is normal.  IMPRESSION: Appliances appear in satisfactory location. Extensive subcutaneous emphysema in the neck and chest with mediastinal emphysema. Developing focal infiltration or atelectasis in the right mid lung.  Results were telephoned to the floor nurse at 0640 hr on 01/06/2014 as  requested.   Electronically Signed   By: Burman Nieves M.D.   On: 01/06/2014 06:40   Dg Chest Port 1 View  01/06/2014   CLINICAL DATA:  Respiratory distress with tracheostomy, now intubated.  EXAM: PORTABLE CHEST - 1 VIEW  COMPARISON:  01/04/2014  FINDINGS: Interval removal of tracheostomy tube and placement  of an endotracheal tube. Tip measures 4.5 cm above the carinal. Shallow inspiration with elevation of the right hemidiaphragm. Heart size and pulmonary vascularity are normal. There is interval development of prominent subcutaneous emphysema throughout the neck and upper chest with probable pneumomediastinum. No definitive evidence of pneumothorax. However, the subcutaneous emphysema could obscure small pneumothoraces. Atelectasis in the left lung base similar to prior study.  IMPRESSION: Endotracheal tube tip measures 4.5 cm above the carina. Extensive subcutaneous emphysema in the neck and upper chest with probable pneumomediastinum. No definitive pneumothorax. Atelectasis in the left lung base.   Electronically Signed   By: Burman Nieves M.D.   On: 01/06/2014 05:22   Dg Abd Portable 1v  01/06/2014   CLINICAL DATA:  OG tube placement  EXAM: PORTABLE ABDOMEN - 1 VIEW  COMPARISON:  Abdominal 04/08/2014.  FINDINGS: An enteric tube is coiled within a large hiatal hernia. There has been increase in the amount of tubing within the hiatal hernia and otherwise stable. Chest tube within the right lung base. Air within nondilated loops of bowel.  IMPRESSION: Enteric tube coiled and a large hiatal hernia.   Electronically Signed   By: Salome Holmes M.D.   On: 01/06/2014 11:17   Dg Abd Portable 1v  01/06/2014   CLINICAL DATA:  OG tube placement  EXAM: PORTABLE ABDOMEN - 1 VIEW  COMPARISON:  None.  FINDINGS: Large hiatal hernia with the tip of the orogastric tube coiled within the herniated gastric fundus. There is no bowel dilatation to suggest obstruction. There is no evidence of pneumoperitoneum, portal  venous gas or pneumatosis. There are no pathologic calcifications along the expected course of the ureters.The osseous structures are unremarkable.  Left-sided chest tube again noted.  IMPRESSION: Large hiatal hernia with the tip of the orogastric tube coiled within the herniated gastric fundus.   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 If chest tube is functioning, no resp tidal noted 6/26 - however, lung remains up  Needs referral to academic facility for thyroidectomy and possible tracheal reconstruction if neurologically improves   CARDIOVASCULAR A:  Cardiac arrest ( bradycardic, PEA. VF ) in setting of respiratory compromise Shock post cardiac arrest - resolved.  H/o HTN, Dyslipidemia, AF, DVT P:  Goal MAP>65, HR>80 Hold Clonidine, Hydralazine, Nitroglycerin Trend troponin / lactate Cardene gtt  Coreg 25 BID  RENAL A:   Acute on chronic kidney failure Hypomagnesemia  P:   Trend BMP, Mg Bicarbonate tabs  GASTROINTESTINAL A:   Dysphagia GERD Hiatal Hernia Colostomy status P:   NPO Gastric tube - coiled in stomach, may need post pyloric placement TF per Nutritionist Protonix  HEMATOLOGIC A:  Anemia of chronic disease Chronic anticoagulation for AF / DVT, subtherapeutic on transfer P:  Trend CBC Heparin gtt Coumadin Aranesp Iron  INFECTIOUS A:   No evidence of infection P:   No intervention required  ENDOCRINE A:   Hyperthyroidism  P:    Methimazole as preadmission when able  NEUROLOGIC A:   Acute encephalopathy Possible anoxia  - comatose P:   CT head neg for ICH, concerning for edema / anoxia Neurology consultation  Minimize sedating medications Check neuron specific enolase 01/09/14  GLOBAL: 627/15: NP led Extensive discussion with brother and 2 daughters at bedside regarding patients status.  They indicate they have never had conversations regarding patients EOL wishes.  Updated them regarding CT of head and concerns for anoxic injury.  They would like to have Neurology opinion on CT / exam to proceed.  In the meantime, they would want CPR in the event of cardiac arrest if it occurred a second time.  She has had a long complicated course over the past few months beginning with a complication from colonoscopy / bowel perf requiring admit to LTAC, then CIR and now trach / malfunction with arrest / PTX.   Canary BrimBrandi Ollis, NP-C [facility] Pgr: [phone] or [phone] 01/07/2014, 12:16 PM    STAFF NOTE: I, Dr Lavinia SharpsM Ramaswamy have personally reviewed patient's available data, including medical history, events of note, physical examination and test results as part of my evaluation. I have discussed with resident/NP and other care providers such as pharmacist, RN and RRT.  In addition,  I personally evaluated patient and elicited key findings of acute on chronic resp failure. Cannot wean due to coma post CPR. Will check neuron specific enolase 01/09/14. Will call neuro. Family goals of care ongoing with NP.  Rest per NP/medical resident whose note is outlined above and that I agree with  The patient is critically ill with multiple organ systems failure and requires high complexity decision making for assessment and support, frequent evaluation and titration of therapies, application of advanced monitoring technologies and extensive interpretation of multiple databases.   Critical Care Time devoted to patient care  services described in this note is  35  Minutes.  Dr. Kalman ShanMurali Ramaswamy, M.D., [facility]C.P Pulmonary and Critical Care Medicine Staff Physician [facility] Pager: [phone], If no answer or between  15:00h - 7:00h: call [phone]  01/07/2014 2:05 PM

## 2014-01-07 NOTE — Consult Note (Signed)
Reason for Consult: Hypoxic injury following prolonged ACLS Referring Physician: Dr Molli Knock  CC: Intubated  HPI: Sarah Tran is an unfortunate 68 y.o. female with a very long and complicated hospital course that culminated in respiratory and subsequent cardiac arrest yesterday morning, 01/06/2014. CPR was sustained for 57 minutes. She is currently intubated but not sedated. She has been unresponsive. Nursing reports no gag reflex with suctioning. A CT scan performed yesterday was consistent with edema and sequelae of hypoxic ischemic injury. Neurology was asked to evaluate the patient and to assist with a prognosis.   Past Medical History  Diagnosis Date  . Atrial fibrillation     chronic Coumadin  . Anemia of chronic disease   . HTN (hypertension)   . Morbid obesity   . Diabetes mellitus, type II   . Hyperthyroidism   . Hyperlipidemia   . CKD (chronic kidney disease), stage III   . DVT (deep venous thrombosis)     RLE; LUE; chronic Coumadin  . Glaucoma, bilateral   . Heart murmur   . OSA on CPAP   . History of blood transfusion ~ 2004; 11/2013    "while in hospital"  . H/O hiatal hernia   . Arthritis     "joints" (01/08/2014)  . Gout   . Anxiety     Past Surgical History  Procedure Laterality Date  . Exploratory laparotomy  09/2013    appy, diverting colostomy for sigmoid perf following removal of ileal polyp.   . Gastrostomy tube placement  10/2013  . Tracheostomy  10/2013    placed in [facility]. Trach decannulated 11/18/2013 at [facility].  . Cholecystectomy    . Carpal tunnel release Right   . Appendectomy    . Vaginal hysterectomy      "partial"  . Cardiac catheterization    . Tracheostomy closure    . Tracheostomy tube placement N/A 01-26-14    Procedure: TRACHEOSTOMY;  Surgeon: Flo Shanks, MD;  Location: [facility] OR;  Service: ENT;  Laterality: N/A;  . Laryngoscopy and bronchoscopy N/A 26-Jan-2014    Procedure: DIRECT LARYNGOSCOPY AND BRONCHOSCOPY;  Surgeon: Flo Shanks,  MD;  Location: [facility] OR;  Service: ENT;  Laterality: N/A;    History reviewed. No pertinent family history.  Social History:  reports that she has never smoked. She has never used smokeless tobacco. She reports that she does not drink alcohol or use illicit drugs.  Allergies  Allergen Reactions  . Codeine Other (See Comments)    Pass out    . Ertapenem     From  Med list from [facility]   . Fish-Derived Products     From med list from [facility]  . Peanut Butter Flavor Hives and Other (See Comments)    Lockjaw   . Penicillins Swelling    Medications:  Scheduled: . amLODipine  10 mg Per Tube Daily  . antiseptic oral rinse  15 mL Mouth Rinse QID  . brimonidine  1 drop Both Eyes BID  . carvedilol  25 mg Per Tube BID WC  . chlorhexidine  15 mL Mouth Rinse BID  . cholecalciferol  400 Units Oral Daily  . darbepoetin  40 mcg Subcutaneous Q Sat-1800  . feeding supplement (PRO-STAT SUGAR FREE 64)  30 mL Per Tube BID  . feeding supplement (VITAL HIGH PROTEIN)  1,000 mL Per Tube Q24H  . ferrous sulfate  325 mg Oral TID WC  . insulin aspart  0-20 Units Subcutaneous 6 times per day  .  insulin aspart  3 Units Subcutaneous 6 times per day  . methimazole  5 mg Oral Daily  . pantoprazole (PROTONIX) IV  40 mg Intravenous QHS  . senna-docusate  1 tablet Oral BID  . sodium bicarbonate  1,300 mg Oral BID  . timolol  1 drop Both Eyes BID  . warfarin  2.5 mg Oral ONCE-1800  . Warfarin - Pharmacist Dosing Inpatient   Does not apply q1800  . zinc sulfate  220 mg Oral Daily    ROS: Unobtainable at this time.  Physical Examination: Blood pressure 146/68, pulse 90, temperature 98.3 F (36.8 C), temperature source Oral, resp. rate 28, height 5\' 2"  (1.575 m), weight 246 lb 4.1 oz (111.7 kg), SpO2 100.00%.  Neurologic Examination The patient is intubated but not sedated. NEUROLOGIC:   MENTAL STATUS: The patient is unresponsive. She does not follow commands. She  does not respond to questions. She does not track.  CRANIAL NERVES: pupils equal and approximately 2 mm. There is little if any response to light. Nursing reports no gag reflex with suctioning. There is no corneal reflex or response to threat bilaterally. MOTOR: There is no movement whatsoever even with painful stimuli. SENSORY: Does not respond to pain. COORDINATION: The patient is unable to follow commands for testing. REFLEXES: Plantar response mute.     Laboratory Studies:   Basic Metabolic Panel:  Recent Labs Lab 01/01/14 0320 01/03/14 [id number] 01/05/14 0005 01/06/14 0306 01/06/14 0417 01/06/14 0432 01/06/14 2155 01/07/14 0430  NA 140 140 135* 137 140 137  --  145  K 3.9 3.2* 3.7 3.8 4.8 4.6  --  3.3*  CL 102 98 97 98 99 105  --  102  CO2 23 26 24 24 22   --   --  25  GLUCOSE 104* 119* 133* 102* 90 89  --  116*  BUN 23 19 23  26* 25* 25*  --  37*  CREATININE 1.78* 1.71* 2.53* 2.98* 2.98* 3.10*  --  3.60*  CALCIUM 9.3 9.9 9.1 9.3 10.3  --   --  9.3  MG 1.8 1.7  --  1.7 1.8  --   --  2.2  PHOS 3.6 2.9  --   --  5.1*  --  4.0 4.0    Liver Function Tests:  Recent Labs Lab 01/05/14 0315 01/06/14 0306  AST 14 8  ALT 6 5  ALKPHOS 91 86  BILITOT 0.5 0.4  PROT 5.4* 5.4*  ALBUMIN 2.3* 2.4*   No results found for this basename: LIPASE, AMYLASE,  in the last 168 hours No results found for this basename: AMMONIA,  in the last 168 hours  CBC:  Recent Labs Lab 01/03/14 0338 01/05/14 0315 01/06/14 0306 01/06/14 0417 01/06/14 0432 01/07/14 0430  WBC 8.3 6.6 6.7 9.5  --  10.1  HGB 8.1* 7.4* 7.0* 7.6* 10.9* 8.4*  HCT 25.1* 22.7* 21.3* 24.8* 32.0* 25.6*  MCV 90.6 91.9 91.8 94.3  --  89.8  PLT 212 198 192 260  --  176    Cardiac Enzymes:  Recent Labs Lab 01/06/14 0417 01/06/14 0636 01/06/14 1308 01/06/14 2155  TROPONINI <0.30 1.07* 2.36* 2.38*    BNP: No components found with this basename: POCBNP,   CBG:  Recent Labs Lab 01/06/14 1956 01/07/14 0009  01/07/14 0357 01/07/14 0728 01/07/14 1133  GLUCAP 134* 123* 106* 114* 131*    Microbiology: Results for orders placed during the hospital encounter of 01/07/2014  MRSA PCR SCREENING     Status:  None   Collection Time    12/18/2013  6:17 PM      Result Value Ref Range Status   MRSA by PCR NEGATIVE  NEGATIVE Final   Comment:            The GeneXpert MRSA Assay (FDA     approved for NASAL specimens     only), is one component of a     comprehensive MRSA colonization     surveillance program. It is not     intended to diagnose MRSA     infection nor to guide or     monitor treatment for     MRSA infections.  URINE CULTURE     Status: None   Collection Time    12/20/2013  6:17 PM      Result Value Ref Range Status   Specimen Description URINE, CATHETERIZED   Final   Special Requests ADDED [id number]   Final   Culture  Setup Time     Final   Value: 12/28/2013 08:41     Performed at [facility]   Colony Count     Final   Value: NO GROWTH     Performed at [facility]   Culture     Final   Value: NO GROWTH     Performed at [facility]   Report Status 12/29/2013 FINAL   Final  CULTURE, BLOOD (ROUTINE X 2)     Status: None   Collection Time    01/07/2014 10:46 PM      Result Value Ref Range Status   Specimen Description BLOOD LEFT HAND   Final   Special Requests BOTTLES DRAWN AEROBIC ONLY 6CC   Final   Culture  Setup Time     Final   Value: 12/28/2013 08:39     Performed at [facility]   Culture     Final   Value: NO GROWTH 5 DAYS     Performed at [facility]   Report Status 01/03/2014 FINAL   Final  CULTURE, BLOOD (ROUTINE X 2)     Status: None   Collection Time    12/19/2013 10:52 PM      Result Value Ref Range Status   Specimen Description BLOOD RIGHT HAND   Final   Special Requests BOTTLES DRAWN AEROBIC ONLY 1CC   Final   Culture  Setup Time     Final   Value: 12/28/2013 08:39     Performed at [facility]   Culture      Final   Value: NO GROWTH 5 DAYS     Performed at [facility]   Report Status 01/03/2014 FINAL   Final    Coagulation Studies:  Recent Labs  01/05/14 0315 01/06/14 0306 01/06/14 0417 01/07/14 0430  LABPROT 15.4* 15.4* 14.7 18.6*  INR 1.22 1.22 1.15 1.55*    Urinalysis: No results found for this basename: COLORURINE, APPERANCEUR, LABSPEC, PHURINE, GLUCOSEU, HGBUR, BILIRUBINUR, KETONESUR, PROTEINUR, UROBILINOGEN, NITRITE, LEUKOCYTESUR,  in the last 168 hours  Lipid Panel:     Component Value Date/Time   CHOL 142 01/03/2014 2050   TRIG 93 01/03/2014 2050   HDL 76 01/03/2014 2050   CHOLHDL 1.9 01/03/2014 2050   VLDL 19 01/03/2014 2050   LDLCALC 47 01/03/2014 2050    HgbA1C:  Lab Results  Component Value Date   HGBA1C 6.1* 01/05/2014    Urine Drug Screen:   No results found for this basename:  labopia, cocainscrnur, labbenz, amphetmu, thcu, labbarb    Alcohol Level: No results found for this basename: ETH,  in the last 168 hours  Other results: EKG: - Not available at this time.  Imaging:  Ct Head Wo Contrast 01/06/2014    New, partial diffuse sulcal and ventricular effacement with patchy hypoattenuation throughout both cerebral and cerebellar hemispheres, consistent with edema and sequelae of hypoxic ischemic injury, with greatest involvement of the left occipital lobe. No midline shift or acute intracranial hemorrhage.    Dg Chest Port 1 View 01/07/2014    1. Support apparatus, as above.  2. Persistent but decreasing pneumomediastinum.  3. Patchy interstitial and airspace opacities throughout the left lung appear unchanged.  4. Mild cardiomegaly.      Dg Abd Portable 1v 01/06/2014     Enteric tube coiled and a large hiatal hernia.     Assessment/Plan: 68 year old female with hypoxic ischemic injury following prolonged CPR. Currently intubated and unresponsive, including to noxious stimuli. She's currently on no sedating medications. Although it is somewhat  early in her course of cerebral hypoxic injury, the CT changes of diffuse changes of acute cerebral ischemia, including cerebral edema, and indicates a very poor prognosis.  No changes in current management recommended. We will continue to follow this patient with you.  Delton See PA-C [facility] Pager [phone] 01/07/2014, 2:32 PM  I personally participate in this patient's evaluation and management, including formulating above clinical impression and management recommendations.  Venetia Maxon M.D. [facility] [phone]

## 2014-01-07 NOTE — Progress Notes (Addendum)
ANTICOAGULATION CONSULT NOTE - Follow Up Consult  Pharmacy Consult for Heparin  Indication: atrial fibrillation, hx of DVT  Allergies  Allergen Reactions  . Codeine Other (See Comments)    Pass out    . Ertapenem     From  Med list from [facility]   . Fish-Derived Products     From med list from [facility]  . Peanut Butter Flavor Hives and Other (See Comments)    Lockjaw   . Penicillins Swelling    Patient Measurements: Height: 5\' 2"  (157.5 cm) Weight: 246 lb 4.1 oz (111.7 kg) IBW/kg (Calculated) : 50.1 Heparin Dosing Weight: 77 kg  Vital Signs: Temp: 98.4 F (36.9 C) (06/27 0357) Temp src: Oral (06/27 0357) BP: 156/92 mmHg (06/27 0400) Pulse Rate: 79 (06/27 0445)  Labs:  Recent Labs  01/06/14 0306  01/06/14 0417 01/06/14 0432 01/06/14 0636 01/06/14 1308 01/06/14 2043 01/06/14 2155 01/07/14 0430  HGB 7.0*  --  7.6* 10.9*  --   --   --   --  8.4*  HCT 21.3*  --  24.8* 32.0*  --   --   --   --  25.6*  PLT 192  --  260  --   --   --   --   --  176  APTT  --   --  55*  --   --   --   --   --   --   LABPROT 15.4*  --  14.7  --   --   --   --   --  18.6*  INR 1.22  --  1.15  --   --   --   --   --  1.55*  HEPARINUNFRC 0.51  --   --   --   --   --  0.98*  --  0.94*  CREATININE 2.98*  --  2.98* 3.10*  --   --   --   --  3.60*  TROPONINI  --   < > <0.30  --  1.07* 2.36*  --  2.38*  --   < > = values in this interval not displayed.  Estimated Creatinine Clearance: 18.1 ml/min (by C-G formula based on Cr of 3.6).   Medications:  Heparin 1200 units/hr  Assessment: 68 y/o F on heparin for afib/hx DVT. HL still 0.94 despite rate decrease. Hgb down today>>no overt bleeding per RN, continue to monitor.   Goal of Therapy:  Heparin level 0.3-0.7 units/ml Monitor platelets by anticoagulation protocol: Yes   Plan:  -Decrease heparin to 1000 units/hr -1400 HL -Daily CBC/HL -Trend Hgb, monitor for bleeding  Abran Duke 01/07/2014,5:49 AM  Addendum: Last two labs obtained from central line - will request peripheral stick this afternoon to determine true levels. Nadara Mustard, PharmD., MS Clinical Pharmacist Pager:  [phone]

## 2014-01-07 NOTE — Progress Notes (Signed)
ANTICOAGULATION CONSULT NOTE - Follow Up Consult  Pharmacy Consult for Heparin  Indication: atrial fibrillation, hx of DVT  Allergies  Allergen Reactions  . Codeine Other (See Comments)    Pass out    . Ertapenem     From  Med list from [facility]   . Fish-Derived Products     From med list from [facility]  . Peanut Butter Flavor Hives and Other (See Comments)    Lockjaw   . Penicillins Swelling   Patient Measurements: Height: 5\' 2"  (157.5 cm) Weight: 246 lb 4.1 oz (111.7 kg) IBW/kg (Calculated) : 50.1 Heparin Dosing Weight: 77 kg  Vital Signs: Temp: 98.3 F (36.8 C) (06/27 1200) Temp src: Oral (06/27 1200) BP: 149/72 mmHg (06/27 1400) Pulse Rate: 90 (06/27 1400)  Labs:  Recent Labs  01/06/14 0306  01/06/14 0417 01/06/14 0432 01/06/14 0636 01/06/14 1308 01/06/14 2043 01/06/14 2155 01/07/14 0430 01/07/14 1445  HGB 7.0*  --  7.6* 10.9*  --   --   --   --  8.4*  --   HCT 21.3*  --  24.8* 32.0*  --   --   --   --  25.6*  --   PLT 192  --  260  --   --   --   --   --  176  --   APTT  --   --  55*  --   --   --   --   --   --   --   LABPROT 15.4*  --  14.7  --   --   --   --   --  18.6*  --   INR 1.22  --  1.15  --   --   --   --   --  1.55*  --   HEPARINUNFRC 0.51  --   --   --   --   --  0.98*  --  0.94* 0.82*  CREATININE 2.98*  --  2.98* 3.10*  --   --   --   --  3.60*  --   TROPONINI  --   < > <0.30  --  1.07* 2.36*  --  2.38*  --   --   < > = values in this interval not displayed.  Estimated Creatinine Clearance: 18.1 ml/min (by C-G formula based on Cr of 3.6).  Medications:  Heparin 1000 units/hr  Assessment: 68 y/o F on heparin for afib/hx DVT. HL still 0.88 despite rate decrease. I requested a peripheral stick to ensure appropriate level.  Hgb down >>no overt bleeding per RN, continue to monitor.  Platelets are stable.  Goal of Therapy:  Heparin level 0.3-0.7 units/ml Monitor platelets by anticoagulation protocol:  Yes   Plan:  -Decrease heparin to 800 units/hr -2300 HL -Daily CBC/HL -Trend Hgb, monitor for bleeding Nadara Mustard, PharmD., MS Clinical Pharmacist Pager:  [phone] Thank you for allowing pharmacy to be part of this patients care team. 01/07/2014,3:27 PM

## 2014-01-08 ENCOUNTER — Inpatient Hospital Stay (HOSPITAL_COMMUNITY): Payer: Medicare Other

## 2014-01-08 ENCOUNTER — Encounter (HOSPITAL_COMMUNITY): Payer: Self-pay | Admitting: Radiology

## 2014-01-08 DIAGNOSIS — G931 Anoxic brain damage, not elsewhere classified: Secondary | ICD-10-CM

## 2014-01-08 DIAGNOSIS — R4020 Unspecified coma: Secondary | ICD-10-CM

## 2014-01-08 LAB — GLUCOSE, CAPILLARY
GLUCOSE-CAPILLARY: 51 mg/dL — AB (ref 70–99)
GLUCOSE-CAPILLARY: 80 mg/dL (ref 70–99)
GLUCOSE-CAPILLARY: 88 mg/dL (ref 70–99)
Glucose-Capillary: 57 mg/dL — ABNORMAL LOW (ref 70–99)
Glucose-Capillary: 71 mg/dL (ref 70–99)
Glucose-Capillary: 85 mg/dL (ref 70–99)
Glucose-Capillary: 86 mg/dL (ref 70–99)
Glucose-Capillary: 87 mg/dL (ref 70–99)

## 2014-01-08 LAB — BASIC METABOLIC PANEL
BUN: 51 mg/dL — ABNORMAL HIGH (ref 6–23)
CALCIUM: 8.3 mg/dL — AB (ref 8.4–10.5)
CO2: 26 mEq/L (ref 19–32)
CREATININE: 4.14 mg/dL — AB (ref 0.50–1.10)
Chloride: 102 mEq/L (ref 96–112)
GFR calc Af Amer: 12 mL/min — ABNORMAL LOW (ref >90)
GFR calc non Af Amer: 10 mL/min — ABNORMAL LOW (ref >90)
Glucose, Bld: 96 mg/dL (ref 70–99)
Potassium: 3.5 mEq/L — ABNORMAL LOW (ref 3.7–5.3)
SODIUM: 144 meq/L (ref 137–147)

## 2014-01-08 LAB — PHOSPHORUS
PHOSPHORUS: 3.5 mg/dL (ref 2.3–4.6)
Phosphorus: 3.7 mg/dL (ref 2.3–4.6)

## 2014-01-08 LAB — CBC
HCT: 21 % — ABNORMAL LOW (ref 36.0–46.0)
Hemoglobin: 6.9 g/dL — CL (ref 12.0–15.0)
MCH: 30 pg (ref 26.0–34.0)
MCHC: 32.9 g/dL (ref 30.0–36.0)
MCV: 91.3 fL (ref 78.0–100.0)
Platelets: 145 10*3/uL — ABNORMAL LOW (ref 150–400)
RBC: 2.3 MIL/uL — AB (ref 3.87–5.11)
RDW: 16.1 % — ABNORMAL HIGH (ref 11.5–15.5)
WBC: 7.7 10*3/uL (ref 4.0–10.5)

## 2014-01-08 LAB — PREPARE RBC (CROSSMATCH)

## 2014-01-08 LAB — HEPARIN LEVEL (UNFRACTIONATED): HEPARIN UNFRACTIONATED: 0.23 [IU]/mL — AB (ref 0.30–0.70)

## 2014-01-08 LAB — PROTIME-INR
INR: 2.12 — ABNORMAL HIGH (ref 0.00–1.49)
PROTHROMBIN TIME: 23.7 s — AB (ref 11.6–15.2)

## 2014-01-08 LAB — OCCULT BLOOD GASTRIC / DUODENUM (SPECIMEN CUP): OCCULT BLOOD, GASTRIC: POSITIVE — AB

## 2014-01-08 MED ORDER — DEXTROSE-NACL 5-0.9 % IV SOLN
INTRAVENOUS | Status: DC
  Administered 2014-01-08: 06:00:00 via INTRAVENOUS

## 2014-01-08 MED ORDER — CARVEDILOL 12.5 MG PO TABS
12.5000 mg | ORAL_TABLET | Freq: Two times a day (BID) | ORAL | Status: DC
  Administered 2014-01-09 – 2014-01-10 (×3): 12.5 mg
  Filled 2014-01-08 (×6): qty 1

## 2014-01-08 MED ORDER — DEXTROSE 50 % IV SOLN: 25.0000 mL | Freq: Once | INTRAVENOUS | Status: DC | PRN

## 2014-01-08 MED ORDER — DEXTROSE 50 % IV SOLN
INTRAVENOUS | Status: AC
Start: 2014-01-08 — End: 2014-01-08
  Filled 2014-01-08: qty 50

## 2014-01-08 MED ORDER — DEXTROSE 50 % IV SOLN
25.0000 mL | Freq: Once | INTRAVENOUS | Status: AC | PRN
  Administered 2014-01-08: 25 mL via INTRAVENOUS

## 2014-01-08 MED ORDER — PANTOPRAZOLE SODIUM 40 MG IV SOLR
40.0000 mg | Freq: Two times a day (BID) | INTRAVENOUS | Status: DC
  Administered 2014-01-08 – 2014-01-10 (×4): 40 mg via INTRAVENOUS
  Filled 2014-01-08 (×6): qty 40

## 2014-01-08 MED ORDER — DEXTROSE 50 % IV SOLN: 50.0000 mL | Freq: Once | INTRAVENOUS | Status: AC | PRN

## 2014-01-08 NOTE — Progress Notes (Signed)
CRITICAL VALUE ALERT  Critical value received:  Hmg  6.9  Date of notification:  01/08/2014  Time of notification:  0540  Critical value read back:Yes.    Nurse who received alert:  G. Myer Haff RN  MD notified (1st page):  Dr. Craige Cotta  Time of first page:  0605  MD notified (2nd page):  Time of second page:  Responding MD:  Dr. Craige Cotta  Time MD responded:  [phone]

## 2014-01-08 NOTE — Progress Notes (Signed)
eLink Physician-Brief Progress Note Patient Name: Sarah Tran DOB: 1946-04-23 MRN: [id number]  Date of Service  01/08/2014   HPI/Events of Note   Tube feeds held due to issue with OG tube.  Hypoglycemia noted.  eICU Interventions  Order hypoglycemia protocol.  Add dextrose to IV fluid until tube feeds can be resumed, and adjust SSI.   Intervention Category Major Interventions: Other:  SOOD,VINEET 01/08/2014, 4:23 AM

## 2014-01-08 NOTE — Progress Notes (Signed)
Called & spoke with Dr. Craige Cotta about Hmg of 6.9, INR level, and low urinary output throughout the night.  Dr. Craige Cotta advised he would not treat Hmg.  Regarding INR levels and possible discontinuance of Heparin, he advised to inform day shift and let them address that issue.  Advised to keep an eye on the low urinary output.

## 2014-01-08 NOTE — Progress Notes (Signed)
eLink Physician-Brief Progress Note Patient Name: Sarah Tran DOB: 12/29/1945 MRN: [id number]  Date of Service  01/08/2014   HPI/Events of Note   RN called with abd xr results: 1. NGT is looping back into esophagus 2. Abdominal air on the left ( I think s likely related to recent L chest tube placement and the air is in the abdominal wall), however radiologists strongly recommends abd / pelv CT   eICU Interventions   1.  NGT to be reinserted 2. Abd / pelv dry CT ordered    Intervention Category Intermediate Interventions: Communication with other healthcare providers and/or family  Lonia Farber 01/08/2014, 5:00 PM

## 2014-01-08 NOTE — Progress Notes (Signed)
Pt transported to/from CT without event. 

## 2014-01-08 NOTE — Progress Notes (Signed)
Subjective: Remains unresponsive. No new adverse events reported overnight.  Objective: Current vital signs: BP 112/53  Pulse 71  Temp(Src) 97.8 F (36.6 C) (Oral)  Resp 18  Ht 5\' 2"  (1.575 m)  Wt 113.1 kg (249 lb 5.4 oz)  BMI 45.59 kg/m2  SpO2 100%  Neurologic Exam: Intubated and on mechanical ventilation. No clear spontaneous respirations noted. Patient was unresponsive to external stimuli, including noxious stimuli. Pupils were 3 mm in size and nonreactive to light. Extraocular movements were absent with oculocephalic maneuvers. Corneal reflexes were absent. No facial asymmetry was noted. Muscle tone was flaccid throughout. Patient had no spontaneous movements as well as no abnormal posturing.  Medications: I have reviewed the patient's current medications.  Assessment/Plan: Severe anoxic brain injury with diffuse severe encephalopathic process clinically as well as early changes of diffuse encephalopathy with cerebral edema seen on CT scan 2 days ago. Prognosis is very poor for recovery.  Recommend no changes in current management. We will continue to follow this patient with you.  C.R. Roseanne Reno, MD [facility] [phone]  01/08/2014  12:10 PM

## 2014-01-08 NOTE — Progress Notes (Signed)
PULMONARY / CRITICAL CARE MEDICINE  Name: Sarah Tran MRN: [id number] DOB: 1946-04-22    ADMISSION DATE:  12/14/2013 CONSULTATION DATE:  01/10/2014   REFERRING MD :  EDP PRIMARY SERVICE: PCCM  CHIEF COMPLAINT:  Respiratory Distress  BRIEF PATIENT DESCRIPTION: 68 y/o with recent prolonged hospitalization including tracheostomy, ultimately decannulated and d/c'd 6/1. Presented to outside hospital on 6/16 for respiratory distress. [facility].  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  On 6/19 underwent tracheostomy by ENT.  On 6/26 developed cardiac arrest likely secondary to respiratory event, resuscitated, brought to ICU.  SIGNIFICANT EVENTS / STUDIES:   6/17  Laryngoscopy by ENT >>> Normal vocal cords, tracheomalacia / stenosis 6/19 Tracheostomy by ENT 6/26 Cardiac arrest , likely secondary to respiratory event, resuscitated, bilateral needle thoracotomy during ACLS, tube thoracotomy L with air escaping and stabilization of vital signs  6/28 - check Neuron Specific Enolase  LINES / TUBES: Foley 6/16 >>> Trach (ENT) 6/19 >>> L chest tube 6/25 >>> L [place] CVL 6/25 >>>  CULTURES: Blood 6/16 >>> neg Urine 6/16 >>> neg  ANTIBIOTICS: Levaquin 6/16 >>> 6/17 Vancomycin 6/16 >>> 6/17  INTERVAL HISTORY:  RN reports pt now off cardene gtt with soft BP's.   Staff MD: worsening neuro exam   VITAL SIGNS: Temp:  [97.5 F (36.4 C)-98.6 F (37 C)] 97.8 F (36.6 C) (06/28 1201) Pulse Rate:  [64-94] 71 (06/28 1139) Resp:  [9-31] 18 (06/28 1139) BP: (108-149)/(48-82) 112/53 mmHg (06/28 1139) SpO2:  [90 %-100 %] 100 % (06/28 1139) FiO2 (%):  [40 %] 40 % (06/28 1140) Weight:  [249 lb 5.4 oz (113.1 kg)] 249 lb 5.4 oz (113.1 kg) (06/28 0500)  HEMODYNAMICS:   VENTILATOR SETTINGS: Vent Mode:  [-] PRVC FiO2 (%):  [40 %] 40 % Set Rate:  [14 bmp] 14 bmp Vt Set:  [450 mL] 450 mL PEEP:  [5 cmH20] 5 cmH20 Plateau Pressure:  [10 cmH20-24 cmH20] 18  cmH20  INTAKE / OUTPUT: Intake/Output     06/27 0701 - 06/28 0700 06/28 0701 - 06/29 0700   I.V. (mL/kg) 825.5 (7.3) 164 (1.5)   NG/GT     Total Intake(mL/kg) 825.5 (7.3) 164 (1.5)   Urine (mL/kg/hr) 560 (0.2) 20 (0)   Emesis/NG output     Stool 150 (0.1)    Chest Tube     Total Output 710 20   Net +115.5 +144         PHYSICAL EXAMINATION: General: Mechanically ventilated via ETT Neuro: Of sedation: RASS -5, GCS 3, No gag. No motor activity,  No corneal No pupil light reflex (worse x 2 days, had gag 2 days ago per RN) but does breathe above vent HEENT: reduced periorbital  emphysema, tracheostomy site closed with occlusive dressing, ETT in place.  Large tongue Cardiovascular: Regular, no murmurs Lungs: Bilateral diminished air entry, left chest without air leak  Abdomen: Obese, soft, colostomy  Musculoskeletal: Trace edema Skin: Intact  LABS: PULMONARY  Recent Labs Lab 01/06/14 0432 01/06/14 0747 01/07/14 0444  PHART  --  7.363 7.565*  PCO2ART  --  42.1 31.9*  PO2ART  --  225.0* 131.0*  HCO3  --  24.1* 28.9*  TCO2 21 25 30   O2SAT  --  100.0 99.0    CBC  Recent Labs Lab 01/06/14 0417 01/06/14 0432 01/07/14 0430 01/08/14 0450  HGB 7.6* 10.9* 8.4* 6.9*  HCT 24.8* 32.0* 25.6* 21.0*  WBC 9.5  --  10.1 7.7  PLT 260  --  176 145*   COAGULATION  Recent Labs Lab 01/05/14 0315 01/06/14 0306 01/06/14 0417 01/07/14 0430 01/08/14 0450  INR 1.22 1.22 1.15 1.55* 2.12*   CARDIAC    Recent Labs Lab 01/06/14 0417 01/06/14 0636 01/06/14 1308 01/06/14 2155  TROPONINI <0.30 1.07* 2.36* 2.38*   No results found for this basename: PROBNP,  in the last 168 hours  CHEMISTRY  Recent Labs Lab 01/03/14 0338 01/05/14 0005 01/06/14 0306 01/06/14 0417 01/06/14 0432 01/06/14 2155 01/07/14 0430 01/07/14 1615 01/07/14 2243 01/08/14 0450 01/08/14 1119  NA 140 135* 137 140 137  --  145  --   --  144  --   K 3.2* 3.7 3.8 4.8 4.6  --  3.3*  --   --  3.5*  --    CL 98 97 98 99 105  --  102  --   --  102  --   CO2 26 24 24 22   --   --  25  --   --  26  --   GLUCOSE 119* 133* 102* 90 89  --  116*  --   --  96  --   BUN 19 23 26* 25* 25*  --  37*  --   --  51*  --   CREATININE 1.71* 2.53* 2.98* 2.98* 3.10*  --  3.60*  --   --  4.14*  --   CALCIUM 9.9 9.1 9.3 10.3  --   --  9.3  --   --  8.3*  --   MG 1.7  --  1.7 1.8  --   --  2.2  --   --   --   --   PHOS 2.9  --   --  5.1*  --  4.0 4.0 4.1 3.8  --  3.5   Estimated Creatinine Clearance: 15.9 ml/min (by C-G formula based on Cr of 4.14).  LIVER  Recent Labs Lab 01/05/14 0315 01/06/14 0306 01/06/14 0417 01/07/14 0430 01/08/14 0450  AST 14 8  --   --   --   ALT 6 5  --   --   --   ALKPHOS 91 86  --   --   --   BILITOT 0.5 0.4  --   --   --   PROT 5.4* 5.4*  --   --   --   ALBUMIN 2.3* 2.4*  --   --   --   INR 1.22 1.22 1.15 1.55* 2.12*   INFECTIOUS  Recent Labs Lab 01/06/14 0636 01/06/14 1308 01/06/14 2200  LATICACIDVEN 5.5* 1.7 1.0   ENDOCRINE CBG (last 3)   Recent Labs  01/08/14 0508 01/08/14 0714 01/08/14 1124  GLUCAP 80 71 86    IMAGING x48h  Ct Head Wo Contrast  01/06/2014   CLINICAL DATA:  Status post cardiac arrest.  EXAM: CT HEAD WITHOUT CONTRAST  TECHNIQUE: Contiguous axial images were obtained from the base of the skull through the vertex without intravenous contrast.  COMPARISON:  10/28/2013  FINDINGS: Compared to the prior CT, there is new diffuse, partial sulcal and ventricular effacement. There is no midline shift. Patchy hypodensities are present throughout both cerebral hemispheres involving both gray and white matter. The most prominent areas of confluent hypoattenuation involve cortex in the left greater than right occipital lobes. Basilar cisterns remain patent. New, patchy hypodensities are also present in the cerebellum. 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 There is no evidence acute intracranial  hemorrhage or extra-axial fluid  collection.  Orbits are unremarkable. Mastoid air cells are clear. Minimal sphenoid sinus mucosal thickening is noted. Diffuse soft tissue emphysema is noted in the periportal region and visualized upper neck.  IMPRESSION: New, partial diffuse sulcal and ventricular effacement with patchy hypoattenuation throughout both cerebral and cerebellar hemispheres, consistent with edema and sequelae of hypoxic ischemic injury, with greatest involvement of the left occipital lobe. No midline shift or acute intracranial hemorrhage.  These results were called by telephone at the time of interpretation on 01/06/2014 at 7:54 PM to patient's nurse, Jamesetta So, who verbally acknowledged these results.   Electronically Signed   By: Sebastian Ache   On: 01/06/2014 19:56   Dg Chest Port 1 View  01/08/2014   CLINICAL DATA:  Evaluate endotracheal tube. Atrial fibrillation. Morbid obesity. Hypertension.  EXAM: PORTABLE CHEST - 1 VIEW  COMPARISON:  1 day prior  FINDINGS: Endotracheal tube terminates 2.7 cm above carina. Feeding tube is looped in the stomach with its tip directed superiorly . This is new. The nasogastric tube has been removed. Left subclavian line terminates likely at the low SVC. Suboptimally visualized.  Mild cardiomegaly. Nearly completely resolved subcutaneous emphysema. No pleural fluid. Left-sided chest tube remains in place. Medial left pneumothorax is suspected. No apical component.  IMPRESSION: Interval replacement of nasogastric tube with a feeding tube. This is looped in the stomach with its tip directed superiorly. This likely terminates in a hiatal hernia.  Left chest tube in place. Nearly completely resolved subcutaneous emphysema. Cannot exclude small volume medial left pleural air.  Cardiomegaly without congestive failure.   Electronically Signed   By: Jeronimo Greaves M.D.   On: 01/08/2014 07:34   Dg Chest Port 1 View  01/07/2014   CLINICAL DATA:  Evaluate endotracheal tube placement.  EXAM: PORTABLE  CHEST - 1 VIEW  COMPARISON:  Chest x-ray 01/06/2014.  FINDINGS: An endotracheal tube is in place with tip 2.1 cm above the carina. There is a left-sided subclavian central venous catheter with tip terminating in the distal superior vena cava. Nasogastric tube is coiled in the patient's large hiatal hernia, but ultimately extends into the stomach. Left-sided chest tube in position with tip in the medial aspect of the left hemithorax. No pneumothorax. Previously noted pneumomediastinum is slightly less evident. There is a small amount of subcutaneous emphysema in the left chest wall tracking in the left cervical region. Lung volumes are low. Patchy areas of interstitial prominence an ill-defined airspace disease are noted throughout the left lung, similar to the recent prior study. Right lung appears clear. No pleural effusions. No evidence of pulmonary edema. Mild cardiomegaly. Upper mediastinal contours are distorted by patient's rotation to the right.  IMPRESSION: 1. Support apparatus, as above. 2. Persistent but decreasing pneumomediastinum. 3. Patchy interstitial and airspace opacities throughout the left lung appear unchanged. 4. Mild cardiomegaly.   Electronically Signed   By: Trudie Reed M.D.   On: 01/07/2014 08:13   Dg Chest Port 1 View  01/06/2014   CLINICAL DATA:  Subcutaneous emphysema, chest tube, question pneumothorax  EXAM: PORTABLE CHEST - 1 VIEW  COMPARISON:  Portable exam 1214 hr compared to 0622 hr  FINDINGS: Tip of endotracheal tube projects 2.3 cm above carina.  Nasogastric tube coiled in large hiatal hernia before entering abdomen/stomach.  LEFT subclavian central venous catheter with tip projecting over SVC.  LEFT thoracostomy tube present.  Stable heart size and pulmonary vascularity.  Pneumomediastinum again identified at cardiac apex and adjacent to  aortic arch.  Scattered atelectasis and infiltrate.  No definite pleural effusion or pneumothorax.  Chest wall emphysema on LEFT with  subcutaneous emphysema in the cervical regions bilaterally.  IMPRESSION: Persistent visualization of pneumomediastinum and chest wall/cervical emphysema.  LEFT thoracostomy tube without definite pneumothorax identified.  Scattered pulmonary infiltrates and atelectasis little changed.   Electronically Signed   By: Ulyses Southward M.D.   On: 01/06/2014 12:33   Dg Abd Portable 1v  01/07/2014   CLINICAL DATA:  Status post panda tube placement.  EXAM: PORTABLE ABDOMEN - 1 VIEW  COMPARISON:  One day prior  FINDINGS: Single motion degraded supine view of the upper abdomen. The feeding tube is looped in the stomach with the tip in the gastric body, inferior to the hiatal hernia. Grossly nonobstructive bowel gas pattern.  IMPRESSION: Feeding tube terminating in the gastric body.   Electronically Signed   By: Jeronimo Greaves M.D.   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 PTx small/stable left side  P:   Continuous mechanical support, 8cc/kg Hold SBT / WUA due to mental status ENT following (Dr Lazarus Salines); Needs referral to academic facility for thyroidectomy and possible tracheal reconstruction if neurologically improves Increased CT sxn to -30 cm 6/28   CARDIOVASCULAR A:  Cardiac arrest ( bradycardic, PEA. VF ) in setting of respiratory compromise Shock post cardiac arrest - resolved.  H/o HTN, Dyslipidemia, AF, DVT   -not on pressors. Cardene weaned off  P:  Goal MAP>65, HR>80 Hold Clonidine, Hydralazine, Nitroglycerin Reduce coreg to 12.5 BID Continue norvasc 10 mg QD  RENAL A:   Acute on chronic kidney failure    - Alkalosis - in setting of bicarb administration  - worsening  renal failure 01/09/14  P:   Trend BMP, Mg D/C Bicarbonate tabs Not a good candidate for HD  GASTROINTESTINAL A:   Dysphagia GERD Hiatal Hernia Colostomy status P:   NPO Gastric tube - coiled in stomach, may need post pyloric placement.  Multiple staff attempts, will ask IR to place pending discussions with family regarding goals of care.  TF per Nutritionist Protonix  HEMATOLOGIC A:  Anemia of chronic disease Chronic anticoagulation for AF / DVT, subtherapeutic on transfer Thrombocytopenia   - hgb <7gm%, INR > 2 on coumadin P:  Trend CBC DC Heparin gtt and RX/ Coumadin per pharmacy  Aranesp Iron Tx for Hgb <7%, 1 unit PRBC's 6/28  INFECTIOUS A:   No evidence of infection P:   No intervention required  ENDOCRINE A:   Hyperthyroidism  P:   Methimazole as preadmission when able  NEUROLOGIC A:   Acute encephalopathy Coma/ anoxia - early in course, code on 6/26 but global edema concerning for poor prognosis    - 01/08/14: exam shows GCS 3, No corneals, No pupils, No motor: Off sedation x 48h. The combination of above even without imaging is High prediction for 0% chance of neuro recovery.   P:   Neurology consultation, appreciate input AWait neuron specific enolase 01/09/14 Continue to be off all sedation meds  GLOBAL: 627/15:extensive discussion with multiple family members at bedside regarding patients status.  They indicate they have never had conversations regarding patients  EOL wishes.  Updated them regarding CT of head and concerns for anoxic injury.  They would like to have Neurology opinion on CT / exam to proceed.  In the meantime, they would want CPR in the event of cardiac arrest if it occurred a second time.  She has had a long complicated course over the past few months beginning with a complication from colonoscopy / bowel perf requiring admit to LTAC, then CIR and now trach / malfunction with arrest / PTX.    01/08/14:  No family at bedside at time  of NP and MD rounds   Canary Brim, NP-C [facility] Pgr: [phone] or [phone] 01/08/2014, 12:07 PM    STAFF NOTE: I, Dr Lavinia Sharps have personally reviewed patient's available data, including medical history, events of note, physical examination and test results as part of my evaluation. I have discussed with resident/NP and other care providers such as pharmacist, RN and RRT.  In addition,  I personally evaluated patient and elicited key findings of acute resp failure - coma following cardiac arrest. Neuro signs suggest 0% chance of functional recovery; can await exam 01/10/14 Family processing infor at this stage and currently full code as d/w NP  Rest per NP/medical resident whose note is outlined above and that I agree with  The patient is critically ill with multiple organ systems failure and requires high complexity decision making for assessment and support, frequent evaluation and titration of therapies, application of advanced monitoring technologies and extensive interpretation of multiple databases.   Critical Care Time devoted to patient care services described in this note is  35  Minutes.  Dr. Kalman Shan, M.D., [facility].C.P Pulmonary and Critical Care Medicine Staff Physician [facility] Pager: [phone], If no answer or between  15:00h - 7:00h: call [phone]  01/08/2014 1:18 PM

## 2014-01-08 NOTE — Progress Notes (Addendum)
ANTICOAGULATION CONSULT NOTE  Pharmacy Consult for Heparin  Indication: atrial fibrillation, hx of DVT  Allergies  Allergen Reactions  . Codeine Other (See Comments)    Pass out    . Ertapenem     From  Med list from [facility]   . Fish-Derived Products     From med list from [facility]  . Peanut Butter Flavor Hives and Other (See Comments)    Lockjaw   . Penicillins Swelling   Patient Measurements: Height: 5\' 2"  (157.5 cm) Weight: 249 lb 5.4 oz (113.1 kg) IBW/kg (Calculated) : 50.1 Heparin Dosing Weight: 77 kg  Vital Signs: Temp: 97.5 F (36.4 C) (06/28 0720) Temp src: Oral (06/28 0720) BP: 112/48 mmHg (06/28 0856) Pulse Rate: 64 (06/28 0900)  Labs:  Recent Labs  01/06/14 0306  01/06/14 0417 01/06/14 0432 01/06/14 0636 01/06/14 1308  01/06/14 2155 01/07/14 0430 01/07/14 1445 01/07/14 2243 01/08/14 0450  HGB 7.0*  --  7.6* 10.9*  --   --   --   --  8.4*  --   --  6.9*  HCT 21.3*  --  24.8* 32.0*  --   --   --   --  25.6*  --   --  21.0*  PLT 192  --  260  --   --   --   --   --  176  --   --  145*  APTT  --   --  55*  --   --   --   --   --   --   --   --   --   LABPROT 15.4*  --  14.7  --   --   --   --   --  18.6*  --   --  23.7*  INR 1.22  --  1.15  --   --   --   --   --  1.55*  --   --  2.12*  HEPARINUNFRC 0.51  --   --   --   --   --   < >  --  0.94* 0.82* 0.47 0.23*  CREATININE 2.98*  --  2.98* 3.10*  --   --   --   --  3.60*  --   --  4.14*  TROPONINI  --   < > <0.30  --  1.07* 2.36*  --  2.38*  --   --   --   --   < > = values in this interval not displayed.  Estimated Creatinine Clearance: 15.9 ml/min (by C-G formula based on Cr of 4.14).  Assessment: 68 y/o Female with h/o afib/DVT and receiving IV heparin and Warfarin therapy.  She has had a significant drop in hemoglobin in the last 24 hours (8.4>>6.9).  Her platelets have dropped as well to 145K.  Spoke with her nurse who reports no obvious source of bleeding,  with soft abdomen, and good bowel sounds.  Her INR is therapeutic today at 2.12 after her third dose of Warfarin.  Goal of Therapy:  Heparin level 0.3-0.7 units/ml Monitor platelets by anticoagulation protocol: Yes   Plan: I have discontinued her IV heparin given therapeutic INR and drop in H/H.  Will discuss with MD regarding continued Warfarin therapy given severity of her illness and ongoing drop in H/H. Follow-up am labs.  Nadara Mustard, PharmD., MS Clinical Pharmacist Pager:  [phone] Thank you for allowing pharmacy to be part of this patients  care team.  01/08/2014,10:21 AM   Addendum: Discussed coffee ground NG content with Dr. Herma CarsonZ.  Will hold Warfarin tonight and f/u labs in the morning and possible bleeding.  Nadara MustardNita Leita Lindbloom, PharmD., MS Pager:  [phone]

## 2014-01-08 NOTE — Progress Notes (Signed)
Following OG tube placement, withdrew black coffee ground gastric contents into syringe.  Notified Earnest Rosier, NP, no new orders received, pt currently receiving 1 unit PRBC for 6.9 Hb.

## 2014-01-08 NOTE — Progress Notes (Signed)
Hypoglycemic Event  CBG: 51  Treatment: D50 IV 25 mL  Symptoms: None  Follow-up CBG: Time:5:08 CBG Result:80  Possible Reasons for Event: Other: Stopped tube feeds d/t NG tube issue  Comments/MD notified:Dr. Burna Mortimer, [name]  Remember to initiate Hypoglycemia Order Set & complete

## 2014-01-08 NOTE — Progress Notes (Signed)
Returned from CT, notified pharmacy that pt did not have a scheduled coumadin order for this evening.  Also discussed pt having had coffee ground gastric contents following placement of OG tube.  Pharmacist stated she would call and speak with physician regarding coumadin administration.

## 2014-01-08 NOTE — Progress Notes (Signed)
Dr. Kalman Shan, M.D., [facility].C.P Pulmonary and Critical Care Medicine Staff Physician [facility] [facility] Pager: [phone], If no answer or between  15:00h - 7:00h: call [phone]  01/08/2014 1:05 PM

## 2014-01-08 NOTE — Progress Notes (Signed)
eLink Physician-Brief Progress Note Patient Name: Sarah Tran DOB: 01/08/1946 MRN: [id number]  Date of Service  01/08/2014   HPI/Events of Note    Recent Labs Lab 01/06/14 0306 01/06/14 0417 01/06/14 0432 01/07/14 0430 01/08/14 0450  HGB 7.0* 7.6* 10.9* 8.4* 6.9*   RN reports: coffee ground via NG   eICU Interventions   Discussed with Pharmacy >>> Coumadin held >>> rounding MD to reassess 2/29 am and Protonix changed to bid    Intervention Category Major Interventions: Hemorrhage - evaluation and management  ZUBELEVITSKIY, KONSTANTIN 01/08/2014, 7:59 PM

## 2014-01-08 NOTE — Progress Notes (Signed)
ANTICOAGULATION CONSULT NOTE  Pharmacy Consult for Heparin  Indication: atrial fibrillation, hx of DVT   Assessment: 68 y/o Female with h/o afib/DVT and receiving IV heparin and Warfarin therapy.  She has had a significant drop in hemoglobin in the last 24 hours (8.4>>6.9).  Her platelets have dropped as well to 145K.  Spoke with her nurse who reports no obvious source of bleeding, with soft abdomen, and good bowel sounds.  Her INR is therapeutic today at 2.12 after her third dose of Warfarin.  Now with coffee ground looking content via NG.  Spoke with Dr. Herma Carson in e-link, will plan to hold Warfarin dose this evening and f/u her labs in the morning.  Will try and maintain a target of 2.0-2.5 if she is to continue.  Nadara Mustard, PharmD., MS Clinical Pharmacist Pager:  [phone] Thank you for allowing pharmacy to be part of this patients care team.

## 2014-01-09 ENCOUNTER — Inpatient Hospital Stay (HOSPITAL_COMMUNITY): Payer: Medicare Other

## 2014-01-09 LAB — TYPE AND SCREEN
ABO/RH(D): A POS
Antibody Screen: NEGATIVE
Unit division: 0

## 2014-01-09 LAB — CBC
HCT: 25.9 % — ABNORMAL LOW (ref 36.0–46.0)
Hemoglobin: 8.4 g/dL — ABNORMAL LOW (ref 12.0–15.0)
MCH: 30.5 pg (ref 26.0–34.0)
MCHC: 32.4 g/dL (ref 30.0–36.0)
MCV: 94.2 fL (ref 78.0–100.0)
PLATELETS: 160 10*3/uL (ref 150–400)
RBC: 2.75 MIL/uL — AB (ref 3.87–5.11)
RDW: 15.9 % — ABNORMAL HIGH (ref 11.5–15.5)
WBC: 8.5 10*3/uL (ref 4.0–10.5)

## 2014-01-09 LAB — BLOOD GAS, ARTERIAL
Acid-Base Excess: 2.8 mmol/L — ABNORMAL HIGH (ref 0.0–2.0)
BICARBONATE: 25.9 meq/L — AB (ref 20.0–24.0)
Drawn by: 362771
FIO2: 0.4 %
MECHVT: 450 mL
O2 Saturation: 99.4 %
PCO2 ART: 33.1 mmHg — AB (ref 35.0–45.0)
PEEP: 5 cmH2O
PH ART: 7.503 — AB (ref 7.350–7.450)
Patient temperature: 97.8
RATE: 14 resp/min
TCO2: 26.9 mmol/L (ref 0–100)
pO2, Arterial: 146 mmHg — ABNORMAL HIGH (ref 80.0–100.0)

## 2014-01-09 LAB — BASIC METABOLIC PANEL
BUN: 56 mg/dL — AB (ref 6–23)
CALCIUM: 8.4 mg/dL (ref 8.4–10.5)
CO2: 25 mEq/L (ref 19–32)
CREATININE: 4.59 mg/dL — AB (ref 0.50–1.10)
Chloride: 103 mEq/L (ref 96–112)
GFR calc Af Amer: 11 mL/min — ABNORMAL LOW (ref >90)
GFR, EST NON AFRICAN AMERICAN: 9 mL/min — AB (ref >90)
Glucose, Bld: 96 mg/dL (ref 70–99)
Potassium: 3.7 mEq/L (ref 3.7–5.3)
Sodium: 145 mEq/L (ref 137–147)

## 2014-01-09 LAB — GLUCOSE, CAPILLARY
GLUCOSE-CAPILLARY: 102 mg/dL — AB (ref 70–99)
GLUCOSE-CAPILLARY: 92 mg/dL (ref 70–99)
Glucose-Capillary: 102 mg/dL — ABNORMAL HIGH (ref 70–99)
Glucose-Capillary: 108 mg/dL — ABNORMAL HIGH (ref 70–99)
Glucose-Capillary: 133 mg/dL — ABNORMAL HIGH (ref 70–99)
Glucose-Capillary: 103 mg/dL — ABNORMAL HIGH (ref 70–99)

## 2014-01-09 LAB — PROTIME-INR
INR: 1.97 — ABNORMAL HIGH (ref 0.00–1.49)
PROTHROMBIN TIME: 22.4 s — AB (ref 11.6–15.2)

## 2014-01-09 MED ORDER — FUROSEMIDE 10 MG/ML IJ SOLN: 40.0000 mg | Freq: Two times a day (BID) | INTRAMUSCULAR | Status: DC

## 2014-01-09 MED ORDER — FUROSEMIDE 10 MG/ML IJ SOLN
120.0000 mg | Freq: Two times a day (BID) | INTRAVENOUS | Status: DC
  Administered 2014-01-09 – 2014-01-10 (×2): 120 mg via INTRAVENOUS
  Filled 2014-01-09 (×3): qty 12

## 2014-01-09 MED ORDER — IOHEXOL 300 MG/ML  SOLN
50.0000 mL | Freq: Once | INTRAMUSCULAR | Status: AC | PRN
  Administered 2014-01-09: 30 mL

## 2014-01-09 NOTE — Progress Notes (Signed)
EEG Completed; Results Pending  

## 2014-01-09 NOTE — Progress Notes (Signed)
Subjective: Patient remains unresponsive, including with tracheal suctioning. No significant changes noted overnight.  Objective: Current vital signs: BP 126/53  Pulse 71  Temp(Src) 98 F (36.7 C) (Axillary)  Resp 18  Ht 5\' 2"  (1.575 m)  Wt 113.4 kg (250 lb)  BMI 45.71 kg/m2  SpO2 100%  Neurologic Exam: Intubated and on mechanical ventilation. Patient has spontaneous respirations as well. No response to noxious stimuli. Pupils were equal and did not react to light. Extraocular movements are absent with oculocephalic maneuvers. No facial weakness noted. Muscle tone was flaccid throughout. Patient had no spontaneous movements and no abnormal posturing.  Medications: I have reviewed the patient's current medications.  Assessment/Plan: Diffuse anoxic brain injury with severe encephalopathy. Patient is showing no signs of improvement since cardiac arrest on 01/06/2014. Diffuse ischemic changes as well as cerebral edema were noted: CT scan of her head on 01/06/2014, as well. Prognosis is poor for recovery.  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  We will continue to follow this patient with you.  C.R. Roseanne Reno, MD [facility] [phone]  01/09/2014  10:29 AM

## 2014-01-09 NOTE — Procedures (Signed)
ELECTROENCEPHALOGRAM REPORT  Patient: Sarah Tran       Room #: 2U63 EEG No. ID: [id number] Age: 68 y.o.        Sex: female Referring Physician: Molli Knock Report Date:  01/09/2014        Interpreting Physician: Aline Brochure  History: Sarah Tran is an 68 y.o. female with a prolonged complicated hospital course for 2 weeks he suffered a cardiac arrest on 01/06/2014. Patient has remained intubated and on mechanical ventilation and unresponsive to all stimuli. CT scan on the day of her cardiac arrest showed diffuse hypoxic changes as well as signs of cerebral edema.  Indications for study:  Assess severity of encephalopathy; rule out seizure activity.  Technique: This is an 18 channel routine scalp EEG performed at the bedside with bipolar and monopolar montages arranged in accordance to the international 10/20 system of electrode placement. All sources of artifact recorded were identified, including muscle and EKG as well as external input. Anterior electrode distances were not double.  Description: This EEG recording was evaluated at normal sensitivity settings of 7 V per millimeter, as well as at increased sensitivity as high as 2 V per millimeter of marker deflection. Significant muscle and EKG artifact was noted. No evidence of remaining electrical brain activity was recorded from any cerebral regions.  Interpretation: This EEG study is abnormal with no signs of brain activity recorded. Electrode distances were standard rather than doubled. As such, electrocerebral silence (ECS) cannot unequivocally be determined. However, given the current EEG findings, patient has very minimal, if any, remaining cerebral activity.  Venetia Maxon M.D. [facility] [phone]

## 2014-01-09 NOTE — Progress Notes (Signed)
PULMONARY / CRITICAL CARE MEDICINE  Name: Sarah Tran MRN: [id number] DOB: 1946-05-03    ADMISSION DATE:  12/28/2013 CONSULTATION DATE:  12/13/2013   REFERRING MD :  EDP PRIMARY SERVICE: PCCM  CHIEF COMPLAINT:  Respiratory Distress  BRIEF PATIENT DESCRIPTION: 68 y/o with recent prolonged hospitalization including tracheostomy, ultimately decannulated and d/c'd 6/1. Presented to outside hospital on 6/16 for respiratory distress. Transferred to [facility].  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  On 6/19 underwent tracheostomy by ENT.  On 6/26 developed cardiac arrest likely secondary to respiratory event, resuscitated, brought to ICU.  SIGNIFICANT EVENTS / STUDIES:   6/17  Laryngoscopy by ENT >>> Normal vocal cords, tracheomalacia / stenosis 6/19 Tracheostomy by ENT 6/26 Cardiac arrest , likely secondary to respiratory event, resuscitated, bilateral needle thoracotomy during ACLS, tube thoracotomy L with air escaping and stabilization of vital signs  6/28 - Check Neuron Specific Enolase 6/28 - DG Abd >>> NG in place, L abdomen gas most resembling SQ gas, o/w non-obstructed bowel gas pattern 6/28 - CT Abd/Pelv >>> small L basilar pneumothorax, extensive amt of SQ emphysema overlying L ant abd wall.  Small locules free air in abd, may be secondary tracking from extensive extra-abdominal gas, acute abd process not excluded 6/29 - PANDA feeding tube placed  LINES / TUBES: Foley 6/16 >>> Trach (ENT) 6/19 >>> L Chest Tube 6/25 >>> L  CVL 6/25 >>> PANDA 6/29 >>>  CULTURES: Blood 6/16 >>> neg Urine 6/16 >>> neg  ANTIBIOTICS: Levaquin 6/16 >>> 6/17 Vancomycin 6/16 >>> 6/17  INTERVAL HISTORY:  Reinsertion of NGT yesterday (6/28) after found to be looping back into esophagus.  Black coffee ground gastric contents in syringe after OG tube placement per RN, coumadin held. Abd air on L, CT Abd/Pelvis as above. Per neuro, poor prognosis for recovery.  VITAL  SIGNS: Temp:  [97.8 F (36.6 C)-98.8 F (37.1 C)] 98 F (36.7 C) (06/29 0900) Pulse Rate:  [65-94] 80 (06/29 1240) Resp:  [15-30] 18 (06/29 1240) BP: (113-157)/(47-92) 128/56 mmHg (06/29 1240) SpO2:  [92 %-100 %] 100 % (06/29 1240) FiO2 (%):  [40 %] 40 % (06/29 1240) Weight:  [113.4 kg (250 lb)] 113.4 kg (250 lb) (06/29 0435)  HEMODYNAMICS:   VENTILATOR SETTINGS: Vent Mode:  [-] PRVC FiO2 (%):  [40 %] 40 % Set Rate:  [14 bmp] 14 bmp Vt Set:  [450 mL] 450 mL PEEP:  [5 cmH20] 5 cmH20 Plateau Pressure:  [15 cmH20-19 cmH20] 18 cmH20  INTAKE / OUTPUT: Intake/Output     06/28 0701 - 06/29 0700 06/29 0701 - 06/30 0700   I.V. (mL/kg) 764 (6.7) 30 (0.3)   Blood 332.5    Total Intake(mL/kg) 1096.5 (9.7) 30 (0.3)   Urine (mL/kg/hr) 210 (0.1)    Stool 250 (0.1)    Total Output 460     Net +636.5 +30         PHYSICAL EXAMINATION: General: Obese AA woman, mechanically ventilated via ETT, NAD. Neuro: RASS -5, No gag. No cough, no corneals, no movement to pain any ext, breaths over, but may be a trigger issue HEENT: Sclera mildly icteric, pupils non-reactive. Tracheostomy site closed with occlusive dressing, ETT in place.  Oral mucosa dry.  Cardiovascular: RRR, no m/g/r. Peripheral edema. Lungs: On vent, respirations even and unlabored. Diminished breath sounds bilaterally, loud mechanical vent sounds. Abdomen: Obese, soft, colostomy present in LLQ. Surgical scar present. Musculoskeletal: No acute deformities. Extremities flaccid. Pedal pulses present and equal bilaterally. Skin:Warm, dry, intact.  LABS: PULMONARY  Recent Labs Lab 01/06/14 0432 01/06/14 0747 01/07/14 0444 01/09/14 0343  PHART  --  7.363 7.565* 7.503*  PCO2ART  --  42.1 31.9* 33.1*  PO2ART  --  225.0* 131.0* 146.0*  HCO3  --  24.1* 28.9* 25.9*  TCO2 21 25 30  26.9  O2SAT  --  100.0 99.0 99.4    CBC  Recent Labs Lab 01/07/14 0430 01/08/14 0450 01/09/14 0502  HGB 8.4* 6.9* 8.4*  HCT 25.6* 21.0* 25.9*   WBC 10.1 7.7 8.5  PLT 176 145* 160   COAGULATION  Recent Labs Lab 01/06/14 0306 01/06/14 0417 01/07/14 0430 01/08/14 0450 01/09/14 0502  INR 1.22 1.15 1.55* 2.12* 1.97*   CARDIAC    Recent Labs Lab 01/06/14 0417 01/06/14 0636 01/06/14 1308 01/06/14 2155  TROPONINI <0.30 1.07* 2.36* 2.38*   No results found for this basename: PROBNP,  in the last 168 hours  CHEMISTRY  Recent Labs Lab 01/03/14 0338  01/06/14 0306 01/06/14 0417 01/06/14 0432  01/07/14 0430 01/07/14 1615 01/07/14 2243 01/08/14 0450 01/08/14 1119 01/08/14 2155 01/09/14 0502  NA 140  < > 137 140 137  --  145  --   --  144  --   --  145  K 3.2*  < > 3.8 4.8 4.6  --  3.3*  --   --  3.5*  --   --  3.7  CL 98  < > 98 99 105  --  102  --   --  102  --   --  103  CO2 26  < > 24 22  --   --  25  --   --  26  --   --  25  GLUCOSE 119*  < > 102* 90 89  --  116*  --   --  96  --   --  96  BUN 19  < > 26* 25* 25*  --  37*  --   --  51*  --   --  56*  CREATININE 1.71*  < > 2.98* 2.98* 3.10*  --  3.60*  --   --  4.14*  --   --  4.59*  CALCIUM 9.9  < > 9.3 10.3  --   --  9.3  --   --  8.3*  --   --  8.4  MG 1.7  --  1.7 1.8  --   --  2.2  --   --   --   --   --   --   PHOS 2.9  --   --  5.1*  --   < > 4.0 4.1 3.8  --  3.5 3.7  --   < > = values in this interval not displayed. Estimated Creatinine Clearance: 14.4 ml/min (by C-G formula based on Cr of 4.59).  LIVER  Recent Labs Lab 01/05/14 0315 01/06/14 0306 01/06/14 0417 01/07/14 0430 01/08/14 0450 01/09/14 0502  AST 14 8  --   --   --   --   ALT 6 5  --   --   --   --   ALKPHOS 91 86  --   --   --   --   BILITOT 0.5 0.4  --   --   --   --   PROT 5.4* 5.4*  --   --   --   --   ALBUMIN 2.3* 2.4*  --   --   --   --  INR 1.22 1.22 1.15 1.55* 2.12* 1.97*   INFECTIOUS  Recent Labs Lab 01/06/14 0636 01/06/14 1308 01/06/14 2200  LATICACIDVEN 5.5* 1.7 1.0   ENDOCRINE CBG (last 3)   Recent Labs  01/09/14 0417 01/09/14 0740 01/09/14 1211   GLUCAP 102* 92 108*    IMAGING x48h  Ct Abdomen Pelvis Wo Contrast  01/08/2014   CLINICAL DATA:  Evaluate for intra-abdominal gas.  EXAM: CT ABDOMEN AND PELVIS WITHOUT CONTRAST  TECHNIQUE: Multidetector CT imaging of the abdomen and pelvis was performed following the standard protocol without IV contrast.  COMPARISON:  CT abdomen pelvis 12/10/2013.  FINDINGS: Visualization of the lower thorax demonstrates small left pleural effusion and left lower lung atelectasis. Small left basilar pneumothorax. Additionally there is a chest tube coursing into the left hemi thorax, incompletely evaluated. Large hiatal hernia.  Lack of intravenous contrast material limits evaluation of the solid organ parenchyma.  Noncontrast enhanced liver is unremarkable. Status post cholecystectomy. Spleen is unremarkable. Marked fatty atrophy of the pancreas. Re- demonstrated 3.7 cm right adrenal adenoma. Bilateral renal cysts are unchanged from recent prior no hydronephrosis.  Normal caliber abdominal aorta. No retroperitoneal lymphadenopathy. Foley catheter decompresses the urinary bladder.  Hartmann's pouch is demonstrated. Patient status post left hemicolectomy with left lower quadrant colostomy. Fluid is demonstrated throughout the small bowel loops and colon. No definite abnormal bowel wall thickening. No evidence for bowel obstruction.  There is an extensive amount of subcutaneous gas within the left hemi abdominal wall soft tissues. There a few foci of retroperitoneal gas within the left paracolic gutter. Additionally there are a few foci of free intraperitoneal gas within the left lower quadrant and central upper abdomen. Small amount of gas is demonstrated within the subcutaneous fat overlying the right anterior abdominal wall.  No aggressive or acute appearing osseous lesions. Degenerative changes of the lower lumbar spine.  IMPRESSION: 1. There is a small left basilar pneumothorax with left chest tube in place. 2. There is  an extensive amount of subcutaneous emphysema within the subcutaneous fat overlying the left anterior abdominal wall. In addition to this there are a few small locules of free intraperitoneal air and retroperitoneal gas within the abdomen. The etiology for the intraperitoneal and retroperitoneal gas is unclear and may be secondary to tracking from the extensive extra abdominal gas. Acute abdominal process causeing free intraperitoneal air is not excluded.  Critical Value/emergent results were called by telephone at the time of interpretation on 01/08/2014 at 8:20 PM to Dr. Lonia Farber , who verbally acknowledged these results.   Electronically Signed   By: Annia Belt M.D.   On: 01/08/2014 20:26   Dg Chest Port 1 View  01/09/2014   CLINICAL DATA:  Endotracheal tube position  EXAM: PORTABLE CHEST - 1 VIEW  COMPARISON:  01/08/2014  FINDINGS: Endotracheal tube is 2 cm above the carina. Central venous catheter tip in the SVC. Left chest tube in place without pneumothorax.  Left lower lobe atelectasis unchanged. Right lung remains clear. Negative for heart failure.  IMPRESSION: Left lower lobe atelectasis is unchanged. No pneumothorax. No change from yesterday.   Electronically Signed   By: Marlan Palau M.D.   On: 01/09/2014 08:05   Dg Chest Port 1 View  01/08/2014   CLINICAL DATA:  Evaluate endotracheal tube. Atrial fibrillation. Morbid obesity. Hypertension.  EXAM: PORTABLE CHEST - 1 VIEW  COMPARISON:  1 day prior  FINDINGS: Endotracheal tube terminates 2.7 cm above carina. Feeding tube is looped in the stomach with its  tip directed superiorly . This is new. The nasogastric tube has been removed. Left subclavian line terminates likely at the low SVC. Suboptimally visualized.  Mild cardiomegaly. Nearly completely resolved subcutaneous emphysema. No pleural fluid. Left-sided chest tube remains in place. Medial left pneumothorax is suspected. No apical component.  IMPRESSION: Interval replacement of  nasogastric tube with a feeding tube. This is looped in the stomach with its tip directed superiorly. This likely terminates in a hiatal hernia.  Left chest tube in place. Nearly completely resolved subcutaneous emphysema. Cannot exclude small volume medial left pleural air.  Cardiomegaly without congestive failure.   Electronically Signed   By: Jeronimo Greaves M.D.   On: 01/08/2014 07:34   Dg Abd Portable 1v  01/09/2014   CLINICAL DATA:  Orogastric tube placement  EXAM: PORTABLE ABDOMEN - 1 VIEW  COMPARISON:  CT abdomen and pelvis January 08, 2014  FINDINGS: Orogastric tube tip and side port are within the stomach. The visualized bowel gas pattern is unremarkable. Multiple lucencies in the abdominal wall region consistent with extraperitoneal air are again noted. There are surgical clips in the gallbladder fossa region. Liver appears prominent.  IMPRESSION: Orogastric tube tip and side port in stomach. Areas of air in the anterior abdominal wall are again noted, better seen on CT.   Electronically Signed   By: Bretta Bang M.D.   On: 01/09/2014 08:02   Dg Abd Portable 1v  01/08/2014   CLINICAL DATA:  68 year old female status post enteric tube placement. Initial encounter.  EXAM: PORTABLE ABDOMEN - 1 VIEW  COMPARISON:  01/07/2014. CT Abdomen and Pelvis 12/10/2013.  FINDINGS: 2 portable supine views of the abdomen at 1550 hrs. Enteric tube courses into the stomach, but loops an the tip is directed cephalad into the gastric cardia which is herniated into the chest as seen on 12/10/2013.  Mild motion artifact on both views. Abnormal subcutaneous appearing gas along the visible left abdominal wall. Large body habitus such that the entire abdomen is not included. Non obstructed bowel gas pattern. Left side chest tube in place.  IMPRESSION: 1. NG type tube placed, loops in the body of the stomach and tip at the level of the gastric fundus which is chronically herniated into the chest. 2. Abnormal left abdomen gas,  most resembling subcutaneous gas. The etiology and significance unclear. 3. Otherwise non obstructed bowel gas pattern.  Study discussed by telephone with RN Dan Maker at the bedside on 01/08/2014 at 16:39 . He palpated the patient's left abdomen while we spoke, and detected no crepitance.  There is a left abdominal ostomy as seen on 12/10/2013, but I do not feel this explains the observed gas on these images.  It is unclear to me what the source and significance of this left abdominal gas finding is.  Repeat CT Abdomen and Pelvis would be necessary to evaluate further.   Electronically Signed   By: Augusto Gamble M.D.   On: 01/08/2014 16:46   Dg Abd Portable 1v  01/07/2014   CLINICAL DATA:  Status post panda tube placement.  EXAM: PORTABLE ABDOMEN - 1 VIEW  COMPARISON:  One day prior  FINDINGS: Single motion degraded supine view of the upper abdomen. The feeding tube is looped in the stomach with the tip in the gastric body, inferior to the hiatal hernia. Grossly nonobstructive bowel gas pattern.  IMPRESSION: Feeding tube terminating in the gastric body.   Electronically Signed   By: Jeronimo Greaves M.D.   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 wil assess for tidal, consider reduction suction or water seal in am  -abg reviewed, reduce MV, rate reduction not help as breaths over but will assess if this is trigger  issue / sensitivity -TV reduction  CARDIOVASCULAR A:  Cardiac arrest ( bradycardic, PEA. VF ) in setting of respiratory compromise Shock post cardiac arrest - resolved.  H/o HTN, Dyslipidemia, AF, DVT P:  - Off pressors - Goal MAP > 60, HR > 80 - Hold Clonidine, Hydralazine, Nitroglycerin - Continue Coreg 12.5 BID - Continue Norvasc 10 mg QD  RENAL A:   Acute on chronic kidney failure Alkalosis - in setting of bicarb administration Worsening renal failure 01/09/14 P:   - Trend BMP -may need lasix - Not a good candidate for HD  GASTROINTESTINAL A:   Dysphagia GERD Hiatal Hernia Colostomy status Air likely is from PTX  Coffee ground, mild, from NGT?  P:   - PANDA placed, start T F - Protonix Q12 - CT Abd/Pelv >> SQ emphysema abdominal wall, small amt free air in abd - cannot exclude acute abdominal process -start trickle  HEMATOLOGIC A:  Anemia of chronic disease, dilutional anemia Chronic anticoagulation for AF / DVT, subtherapeutic on transfer Thrombocytopenia R/o gi bleed P:  - Trend CBC in am - Hold coumadin - Aranesp - Iron -lasix - Tx for Hgb <7%, 1 unit PRBC's  INFECTIOUS A:   No evidence of infection Abd free air (residual may perf?, tracking frmo ptx) P:   - No intervention required - Follow CBC, fever for evidence of acute abd process -ct unlikely perf  ENDOCRINE A:   Hyperthyroidism  P:   - Methimazole as preadmission when able  NEUROLOGIC A:   Acute encephalopathy Coma/Anoxia - early in course, code on 6/26 but global edema concerning for poor prognosis   P:   - horrible outcome likely, will d/w family comfort care - EEG per neuro to r/o sz activity - Await neuron specific enolase 01/09/14 - Continue to be off all sedation meds -will assess if breathing over is trigger issue- if found does not breath over I will apnea her  GLOBAL: 01/07/14: Extensive discussion with multiple family members at bedside regarding patients status.   They indicate they have never had conversations regarding patients EOL wishes.  Updated them regarding CT of head and concerns for anoxic injury.  They would like to have Neurology opinion on CT/exam to proceed.  In the meantime, they would want CPR in the event of cardiac arrest if it occurred a second time.  She has had a long complicated course over the past few months beginning with a complication from colonoscopy/bowel perf requiring admit to LTAC, then CIR and now trach / malfunction with arrest / PTX.   Contined support, vent, pressors, HD, surgeries, is medically futile / ineffective therapy   Elenore Paddy. Reese, PA-S  Ccm time 30 min   I have fully examined this  patient and agree with above findings.      Mcarthur Rossettianiel J. Tyson AliasFeinstein, MD, FACP Pgr: [phone] [facility]

## 2014-01-09 NOTE — Progress Notes (Signed)
Patient ID: Sarah Tran, female   DOB: Apr 28, 1946, 68 y.o.   MRN: [id number] I have had extensive discussions with family daughters x 2. We discussed patients current circumstances and organ failures. We also discussed patient's prior wishes under circumstances such as this. Family has decided to NOT perform resuscitation if arrest but to continue current medical support for now. They will meet with me tues at 11 for comfort care talks  Mcarthur Rossetti. Tyson Alias, MD, FACP Pgr: [phone] [facility]

## 2014-01-10 ENCOUNTER — Inpatient Hospital Stay: Payer: Medicare Other | Admitting: Physical Medicine & Rehabilitation

## 2014-01-10 ENCOUNTER — Inpatient Hospital Stay (HOSPITAL_COMMUNITY): Payer: Medicare Other

## 2014-01-10 LAB — COMPREHENSIVE METABOLIC PANEL
ALT: 59 U/L — ABNORMAL HIGH (ref 0–35)
AST: 57 U/L — ABNORMAL HIGH (ref 0–37)
Albumin: 2 g/dL — ABNORMAL LOW (ref 3.5–5.2)
Alkaline Phosphatase: 114 U/L (ref 39–117)
BUN: 64 mg/dL — ABNORMAL HIGH (ref 6–23)
CALCIUM: 8.6 mg/dL (ref 8.4–10.5)
CO2: 24 meq/L (ref 19–32)
CREATININE: 4.77 mg/dL — AB (ref 0.50–1.10)
Chloride: 102 mEq/L (ref 96–112)
GFR, EST AFRICAN AMERICAN: 10 mL/min — AB (ref >90)
GFR, EST NON AFRICAN AMERICAN: 9 mL/min — AB (ref >90)
GLUCOSE: 125 mg/dL — AB (ref 70–99)
Potassium: 3.6 mEq/L — ABNORMAL LOW (ref 3.7–5.3)
Sodium: 143 mEq/L (ref 137–147)
Total Bilirubin: 0.4 mg/dL (ref 0.3–1.2)
Total Protein: 5 g/dL — ABNORMAL LOW (ref 6.0–8.3)

## 2014-01-10 LAB — CBC WITH DIFFERENTIAL/PLATELET
Basophils Absolute: 0 10*3/uL (ref 0.0–0.1)
Basophils Relative: 0 % (ref 0–1)
Eosinophils Absolute: 0.2 10*3/uL (ref 0.0–0.7)
Eosinophils Relative: 3 % (ref 0–5)
HCT: 25.2 % — ABNORMAL LOW (ref 36.0–46.0)
HEMOGLOBIN: 8.1 g/dL — AB (ref 12.0–15.0)
LYMPHS ABS: 0.8 10*3/uL (ref 0.7–4.0)
LYMPHS PCT: 10 % — AB (ref 12–46)
MCH: 30.5 pg (ref 26.0–34.0)
MCHC: 32.1 g/dL (ref 30.0–36.0)
MCV: 94.7 fL (ref 78.0–100.0)
MONOS PCT: 6 % (ref 3–12)
Monocytes Absolute: 0.4 10*3/uL (ref 0.1–1.0)
NEUTROS ABS: 6.2 10*3/uL (ref 1.7–7.7)
NEUTROS PCT: 81 % — AB (ref 43–77)
Platelets: 132 10*3/uL — ABNORMAL LOW (ref 150–400)
RBC: 2.66 MIL/uL — ABNORMAL LOW (ref 3.87–5.11)
RDW: 15.9 % — ABNORMAL HIGH (ref 11.5–15.5)
WBC: 7.6 10*3/uL (ref 4.0–10.5)

## 2014-01-10 LAB — PROTIME-INR
INR: 2.38 — ABNORMAL HIGH (ref 0.00–1.49)
Prothrombin Time: 26 seconds — ABNORMAL HIGH (ref 11.6–15.2)

## 2014-01-10 LAB — GLUCOSE, CAPILLARY
GLUCOSE-CAPILLARY: 133 mg/dL — AB (ref 70–99)
Glucose-Capillary: 127 mg/dL — ABNORMAL HIGH (ref 70–99)
Glucose-Capillary: 134 mg/dL — ABNORMAL HIGH (ref 70–99)

## 2014-01-10 MED ORDER — MORPHINE BOLUS VIA INFUSION
5.0000 mg | INTRAVENOUS | Status: DC | PRN
  Filled 2014-01-10: qty 20

## 2014-01-10 MED ORDER — MORPHINE SULFATE 50 MG/ML IV SOLN
10.0000 mg/h | INTRAVENOUS | Status: DC
  Filled 2014-01-10: qty 10

## 2014-01-10 MED ORDER — DEXTROSE 5 % IV SOLN
10.0000 mg/h | INTRAVENOUS | Status: DC
  Administered 2014-01-10: 10 mg/h via INTRAVENOUS
  Filled 2014-01-10 (×3): qty 10

## 2014-01-10 MED ORDER — SODIUM CHLORIDE 0.9 % IV SOLN: 10.0000 mg/h | INTRAVENOUS | Status: DC

## 2014-01-10 MED ORDER — MORPHINE SULFATE 10 MG/ML IJ SOLN
10.0000 mg/h | INTRAVENOUS | Status: DC
  Administered 2014-01-10: 5 mg/h via INTRAVENOUS
  Administered 2014-01-10 (×2): 45 mg/h via INTRAVENOUS
  Administered 2014-01-10: 5 mg/h via INTRAVENOUS
  Administered 2014-01-11 (×4): 45 mg/h via INTRAVENOUS
  Filled 2014-01-10 (×7): qty 10

## 2014-01-10 NOTE — Progress Notes (Signed)
eLink Physician-Brief Progress Note Patient Name: Sarah Tran DOB: 08/28/1945 MRN: [id number]  Date of Service  01/10/2014   HPI/Events of Note  Pt comfort care and extubated earlier today.    eICU Interventions  Will arrange for transfer to non tele floor bed to allow for easier family access to pt during dying process.   Intervention Category Major Interventions: Other:  SOOD,VINEET 01/10/2014, 5:40 PM

## 2014-01-10 NOTE — Progress Notes (Signed)
Chaplain was requested by family to come and provide prayer and emotional support to family as patient is actively dying. Chaplain provided prayer and emotional support to family and advise family if further services are needed to have chaplin paged.  Cindie Crumbly, Chapla  01/10/14 1600  Clinical Encounter Type  Visited With Patient and family together  Visit Type Patient actively dying;Spiritual support  Referral From Family  Consult/Referral To Chaplain  Spiritual Encounters  Spiritual Needs Prayer;Emotional;Grief support  Stress Factors  Patient Stress Factors None identified  Family Stress Factors Loss;Major life changes  in

## 2014-01-10 NOTE — Progress Notes (Signed)
Patient transferred to 5W from . Patient appears comfortable, resp rate of 28, not struggling, no gurgling heard.  MSO4 gtt at rate of 45mg /hr, new bag hung upon arrival. Rapid respond RN to also assess patient and comfort level. Colostomy bag secure, draining small amounts, emptied prior to arrival. Foley catheter secured with leg band and emptied prior to arrival on unit. Chest tube to water seal, intact, no leak. Family at beside. Patient appears comfortable per family and RN assessment. Will continue to monitor and keep comfortable. Per RN patients family does not want autopsy.   Bess Kinds M

## 2014-01-10 NOTE — Progress Notes (Signed)
PULMONARY / CRITICAL CARE MEDICINE  Name: Sarah Tran MRN: [id number] DOB: 1945/09/26    ADMISSION DATE:  12/17/2013 CONSULTATION DATE:  12/23/2013   REFERRING MD :  EDP PRIMARY SERVICE: PCCM  CHIEF COMPLAINT:  Respiratory Distress  BRIEF PATIENT DESCRIPTION: 68 y/o with recent prolonged hospitalization including tracheostomy, ultimately decannulated and d/c'd 6/1. Presented to outside hospital on 6/16 for respiratory distress. Transferred to [facility].  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  On 6/19 underwent tracheostomy by ENT.  On 6/26 developed cardiac arrest likely secondary to respiratory event, resuscitated, brought to ICU.  SIGNIFICANT EVENTS / STUDIES:   6/17  Laryngoscopy by ENT >>> Normal vocal cords, tracheomalacia / stenosis 6/19 Tracheostomy by ENT 6/26 Cardiac arrest , likely secondary to respiratory event, resuscitated, bilateral needle thoracotomy during ACLS, tube thoracotomy L with air escaping and stabilization of vital signs  6/28 - Check Neuron Specific Enolase 6/28 - DG Abd >>> NG in place, L abdomen gas most resembling SQ gas, o/w non-obstructed bowel gas pattern 6/28 - CT Abd/Pelv >>> small L basilar pneumothorax, extensive amt of SQ emphysema overlying L ant abd wall.  Small locules free air in abd, may be secondary tracking from extensive extra-abdominal gas, acute abd process not excluded 6/29 - PANDA feeding tube placed 6/29- NCB established  LINES / TUBES: Foley 6/16 >>> Trach (ENT) 6/19 >>> L Chest Tube 6/25 >>> L [place] CVL 6/25 >>> PANDA 6/29 >>>  CULTURES: Blood 6/16 >>> neg Urine 6/16 >>> neg  ANTIBIOTICS: Levaquin 6/16 >>> 6/17 Vancomycin 6/16 >>> 6/17  INTERVAL HISTORY:  No overnight events per nurse.  Patient's neurological status remains unchanged, no improvement.  EEG conducted by neuro is abnormal with no signs of brain activity recorded. Prognosis remains very poor.  Discussion with family about goals of care  planned for today 6/30 at 11am.  VITAL SIGNS: Temp:  [97.5 F (36.4 C)-98.3 F (36.8 C)] 97.5 F (36.4 C) (06/30 0443) Pulse Rate:  [64-89] 64 (06/30 0700) Resp:  [11-27] 14 (06/30 0700) BP: (121-149)/(53-97) 143/74 mmHg (06/30 0700) SpO2:  [97 %-100 %] 100 % (06/30 0700) FiO2 (%):  [40 %] 40 % (06/30 0400) Weight:  [110.7 kg (244 lb 0.8 oz)] 110.7 kg (244 lb 0.8 oz) (06/30 0500)  HEMODYNAMICS:   VENTILATOR SETTINGS: Vent Mode:  [-] PRVC FiO2 (%):  [40 %] 40 % Set Rate:  [14 bmp] 14 bmp Vt Set:  [450 mL] 450 mL PEEP:  [5 cmH20] 5 cmH20 Plateau Pressure:  [10 cmH20-19 cmH20] 19 cmH20  INTAKE / OUTPUT: Intake/Output     06/29 0701 - 06/30 0700 06/30 0701 - 07/01 0700   I.V. (mL/kg) 300 (2.7)    Blood     Other 150    NG/GT 375    IV Piggyback 62    Total Intake(mL/kg) 887 (8)    Urine (mL/kg/hr) 1005 (0.4)    Stool 225 (0.1)    Chest Tube 45 (0)    Total Output 1275     Net -388           PHYSICAL EXAMINATION: General: Obese AA woman, mechanically ventilated via ETT, NAD. Neuro: RASS -5, No gag. No cough, no corneals, no movement to pain any ext, occasionally breathes over vent HEENT: Sclera mildly icteric, pupils fixed and non-reactive. Tracheostomy site closed with occlusive dressing, ETT in place.  Oral mucosa dry.  Cardiovascular: RRR, no m/g/r. Peripheral edema. Lungs: On vent, respirations even and unlabored. Diminished breath sounds bilaterally,  loud mechanical vent sounds. Abdomen: Obese, soft, colostomy present in LLQ. Surgical scar present. Musculoskeletal: No acute deformities. Extremities flaccid. Pedal pulses present and equal bilaterally. Skin:Warm, dry, intact.  LABS: PULMONARY  Recent Labs Lab 01/06/14 0432 01/06/14 0747 01/07/14 0444 01/09/14 0343  PHART  --  7.363 7.565* 7.503*  PCO2ART  --  42.1 31.9* 33.1*  PO2ART  --  225.0* 131.0* 146.0*  HCO3  --  24.1* 28.9* 25.9*  TCO2 21 25 30  26.9  O2SAT  --  100.0 99.0 99.4    CBC  Recent  Labs Lab 01/08/14 0450 01/09/14 0502 01/10/14 0416  HGB 6.9* 8.4* 8.1*  HCT 21.0* 25.9* 25.2*  WBC 7.7 8.5 7.6  PLT 145* 160 132*   COAGULATION  Recent Labs Lab 01/06/14 0417 01/07/14 0430 01/08/14 0450 01/09/14 0502 01/10/14 0416  INR 1.15 1.55* 2.12* 1.97* 2.38*   CARDIAC    Recent Labs Lab 01/06/14 0417 01/06/14 0636 01/06/14 1308 01/06/14 2155  TROPONINI <0.30 1.07* 2.36* 2.38*   No results found for this basename: PROBNP,  in the last 168 hours  CHEMISTRY  Recent Labs Lab 01/06/14 0306 01/06/14 0417 01/06/14 0432  01/07/14 0430 01/07/14 1615 01/07/14 2243 01/08/14 0450 01/08/14 1119 01/08/14 2155 01/09/14 0502 01/10/14 0416  NA 137 140 137  --  145  --   --  144  --   --  145 143  K 3.8 4.8 4.6  --  3.3*  --   --  3.5*  --   --  3.7 3.6*  CL 98 99 105  --  102  --   --  102  --   --  103 102  CO2 24 22  --   --  25  --   --  26  --   --  25 24  GLUCOSE 102* 90 89  --  116*  --   --  96  --   --  96 125*  BUN 26* 25* 25*  --  37*  --   --  51*  --   --  56* 64*  CREATININE 2.98* 2.98* 3.10*  --  3.60*  --   --  4.14*  --   --  4.59* 4.77*  CALCIUM 9.3 10.3  --   --  9.3  --   --  8.3*  --   --  8.4 8.6  MG 1.7 1.8  --   --  2.2  --   --   --   --   --   --   --   PHOS  --  5.1*  --   < > 4.0 4.1 3.8  --  3.5 3.7  --   --   < > = values in this interval not displayed. Estimated Creatinine Clearance: 13.6 ml/min (by C-G formula based on Cr of 4.77).  LIVER  Recent Labs Lab 01/05/14 0315 01/06/14 0306 01/06/14 0417 01/07/14 0430 01/08/14 0450 01/09/14 0502 01/10/14 0416  AST 14 8  --   --   --   --  57*  ALT 6 5  --   --   --   --  59*  ALKPHOS 91 86  --   --   --   --  114  BILITOT 0.5 0.4  --   --   --   --  0.4  PROT 5.4* 5.4*  --   --   --   --  5.0*  ALBUMIN  2.3* 2.4*  --   --   --   --  2.0*  INR 1.22 1.22 1.15 1.55* 2.12* 1.97* 2.38*   INFECTIOUS  Recent Labs Lab 01/06/14 0636 01/06/14 1308 01/06/14 2200  LATICACIDVEN  5.5* 1.7 1.0   ENDOCRINE CBG (last 3)   Recent Labs  01/09/14 2010 01/10/14 0018 01/10/14 0435  GLUCAP 103* 134* 127*    IMAGING x48h  Ct Abdomen Pelvis Wo Contrast  01/08/2014   CLINICAL DATA:  Evaluate for intra-abdominal gas.  EXAM: CT ABDOMEN AND PELVIS WITHOUT CONTRAST  TECHNIQUE: Multidetector CT imaging of the abdomen and pelvis was performed following the standard protocol without IV contrast.  COMPARISON:  CT abdomen pelvis 12/10/2013.  FINDINGS: Visualization of the lower thorax demonstrates small left pleural effusion and left lower lung atelectasis. Small left basilar pneumothorax. Additionally there is a chest tube coursing into the left hemi thorax, incompletely evaluated. Large hiatal hernia.  Lack of intravenous contrast material limits evaluation of the solid organ parenchyma.  Noncontrast enhanced liver is unremarkable. Status post cholecystectomy. Spleen is unremarkable. Marked fatty atrophy of the pancreas. Re- demonstrated 3.7 cm right adrenal adenoma. Bilateral renal cysts are unchanged from recent prior no hydronephrosis.  Normal caliber abdominal aorta. No retroperitoneal lymphadenopathy. Foley catheter decompresses the urinary bladder.  Hartmann's pouch is demonstrated. Patient status post left hemicolectomy with left lower quadrant colostomy. Fluid is demonstrated throughout the small bowel loops and colon. No definite abnormal bowel wall thickening. No evidence for bowel obstruction.  There is an extensive amount of subcutaneous gas within the left hemi abdominal wall soft tissues. There a few foci of retroperitoneal gas within the left paracolic gutter. Additionally there are a few foci of free intraperitoneal gas within the left lower quadrant and central upper abdomen. Small amount of gas is demonstrated within the subcutaneous fat overlying the right anterior abdominal wall.  No aggressive or acute appearing osseous lesions. Degenerative changes of the lower lumbar  spine.  IMPRESSION: 1. There is a small left basilar pneumothorax with left chest tube in place. 2. There is an extensive amount of subcutaneous emphysema within the subcutaneous fat overlying the left anterior abdominal wall. In addition to this there are a few small locules of free intraperitoneal air and retroperitoneal gas within the abdomen. The etiology for the intraperitoneal and retroperitoneal gas is unclear and may be secondary to tracking from the extensive extra abdominal gas. Acute abdominal process causeing free intraperitoneal air is not excluded.  Critical Value/emergent results were called by telephone at the time of interpretation on 01/08/2014 at 8:20 PM to Dr. Doree Fudge , who verbally acknowledged these results.   Electronically Signed   By: Lovey Newcomer M.D.   On: 01/08/2014 20:26   Dg Abd 1 View  01/09/2014   CLINICAL DATA:  Panda feeding tube placement.  EXAM: ABDOMEN - 1 VIEW  COMPARISON:  None.  FINDINGS: A Panda feeding tube is seen within the distal duodenum. The distal tip of the tube is seen at the ligament of Treitz which is confirm by contrast injection.  IMPRESSION: Panda feeding tube tip in distal duodenum near the ligament of Treitz.   Electronically Signed   By: Earle Gell M.D.   On: 01/09/2014 12:57   Dg Chest Port 1 View  01/10/2014   CLINICAL DATA:  Endotracheal tube position  EXAM: PORTABLE CHEST - 1 VIEW  COMPARISON:  01/09/2014  FINDINGS: Left chest tube remains in place and appears to be kinked outside of the  chest. No pneumothorax  Endotracheal tube tip 15 mm above the carina. Left subclavian central venous catheter tip in the SVC. Feeding tube enters the stomach with the tip not visualized.  Left lower lobe consolidation is unchanged and most consistent with partial collapse.  IMPRESSION: No significant change left lower lobe atelectasis.  No pneumothorax.   Electronically Signed   By: Franchot Gallo M.D.   On: 01/10/2014 07:22   Dg Chest Port 1  View  01/09/2014   CLINICAL DATA:  Endotracheal tube position  EXAM: PORTABLE CHEST - 1 VIEW  COMPARISON:  01/08/2014  FINDINGS: Endotracheal tube is 2 cm above the carina. Central venous catheter tip in the SVC. Left chest tube in place without pneumothorax.  Left lower lobe atelectasis unchanged. Right lung remains clear. Negative for heart failure.  IMPRESSION: Left lower lobe atelectasis is unchanged. No pneumothorax. No change from yesterday.   Electronically Signed   By: Franchot Gallo M.D.   On: 01/09/2014 08:05   Dg Abd Portable 1v  01/09/2014   CLINICAL DATA:  Orogastric tube placement  EXAM: PORTABLE ABDOMEN - 1 VIEW  COMPARISON:  CT abdomen and pelvis January 08, 2014  FINDINGS: Orogastric tube tip and side port are within the stomach. The visualized bowel gas pattern is unremarkable. Multiple lucencies in the abdominal wall region consistent with extraperitoneal air are again noted. There are surgical clips in the gallbladder fossa region. Liver appears prominent.  IMPRESSION: Orogastric tube tip and side port in stomach. Areas of air in the anterior abdominal wall are again noted, better seen on CT.   Electronically Signed   By: Lowella Grip M.D.   On: 01/09/2014 08:02   Dg Abd Portable 1v  01/08/2014   CLINICAL DATA:  68 year old female status post enteric tube placement. Initial encounter.  EXAM: PORTABLE ABDOMEN - 1 VIEW  COMPARISON:  01/07/2014. CT Abdomen and Pelvis 12/10/2013.  FINDINGS: 2 portable supine views of the abdomen at 1550 hrs. Enteric tube courses into the stomach, but loops an the tip is directed cephalad into the gastric cardia which is herniated into the chest as seen on 12/10/2013.  Mild motion artifact on both views. Abnormal subcutaneous appearing gas along the visible left abdominal wall. Large body habitus such that the entire abdomen is not included. Non obstructed bowel gas pattern. Left side chest tube in place.  IMPRESSION: 1. NG type tube placed, loops in the body  of the stomach and tip at the level of the gastric fundus which is chronically herniated into the chest. 2. Abnormal left abdomen gas, most resembling subcutaneous gas. The etiology and significance unclear. 3. Otherwise non obstructed bowel gas pattern.  Study discussed by telephone with RN Pam Drown at the bedside on 01/08/2014 at 16:39 . He palpated the patient's left abdomen while we spoke, and detected no crepitance.  There is a left abdominal ostomy as seen on 12/10/2013, but I do not feel this explains the observed gas on these images.  It is unclear to me what the source and significance of this left abdominal gas finding is.  Repeat CT Abdomen and Pelvis would be necessary to evaluate further.   Electronically Signed   By: Lars Pinks M.D.   On: 01/08/2014 16:46   Dg Addison Bailey G Tube Plc W/fl-no Rad  01/09/2014   CLINICAL DATA: placement of post pyloric feeding tube   NASO G TUBE PLACEMENT WITH FLUORO  Fluoroscopy was utilized by the requesting physician.  No radiographic  interpretation.  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 VF ) in setting of respiratory compromise Shock post cardiac arrest - resolved.  H/o HTN, Dyslipidemia, AF, DVT P:  - Remains off pressors, no restart wished - Goal MAP > 60, HR > 80 - Hold Clonidine, Hydralazine, Nitroglycerin - Continue Coreg 12.5 BID - Continue Norvasc 10 mg QD  RENAL A:   Acute on chronic kidney failure Alkalosis - in setting of bicarb administration Worsening renal failure 01/09/14 P:   -consider  lasix  GASTROINTESTINAL A:   Dysphagia GERD Hiatal Hernia Colostomy status Air likely is from PTX  Coffee ground, mild, from NGT?  P:   - PANDA placed, TF trickle, hold until decide on comfort - Protonix Q12 - CT Abd/Pelv >> SQ emphysema abdominal wall, small amt free air in abd - cannot exclude acute abdominal process  HEMATOLOGIC A:  Anemia of chronic disease, dilutional anemia Chronic anticoagulation for AF / DVT, subtherapeutic on transfer Thrombocytopenia R/o gi bleed P:  - Trend CBC in am - Hold coumadin, INR 2.38, coffee ground-like contents from OG 6/29 - Aranesp - Iron - Lasix may need increase - Tx for Hgb <7%, 1 unit PRBC's  INFECTIOUS A:   No evidence of infection Abd free air (residual may perf?, tracking from ptx) P:   - No intervention required - Follow CBC, fever for evidence of acute abd process  ENDOCRINE A:   Hyperthyroidism  P:   - Methimazole as preadmission when able  NEUROLOGIC A:   Acute encephalopathy Coma/Anoxia - early in course, code on 6/26 but global edema concerning for poor prognosis   P:   - Very poor prognosis/outcome, meeting w/ family 6/30 11am. - EEG 6/29 >>> abn, no signs of brain activity recorded - Continue to be off all sedation meds - consider comfort   GLOBAL: 01/07/14: Extensive discussion with multiple family members at bedside regarding patients status.  They indicate they have never had conversations regarding patients EOL wishes.  Updated them regarding CT of head and concerns for anoxic injury.  They would like to have Neurology opinion on CT/exam to proceed.  In the meantime, they would want CPR in the event of cardiac arrest if it occurred a second time.  She has had a long complicated course over the past few months beginning with a complication from colonoscopy/bowel perf requiring admit to LTAC, then CIR and now trach / malfunction with arrest / PTX.   Plan for d/w family today, 6/30 11am. Contined support,  vent, pressors, HD, surgeries, is medically futile/ineffective therapy. ]likely for comfort  Lowella Dell. Ayesha Rumpf, PA-S  Ccm time 30 min   Lavon Paganini. Titus Mould, MD, [place] Pgr: [facility]

## 2014-01-10 NOTE — Progress Notes (Signed)
01/10/2014 9:21 AM  [name] [phone]  Post-Op Day 11    Temp:  [97.5 F (36.4 C)-98.3 F (36.8 C)] 97.5 F (36.4 C) (06/30 0443) Pulse Rate:  [64-89] 64 (06/30 0700) Resp:  [11-27] 14 (06/30 0700) BP: (121-149)/(53-97) 143/74 mmHg (06/30 0700) SpO2:  [97 %-100 %] 100 % (06/30 0700) FiO2 (%):  [40 %] 40 % (06/30 0400) Weight:  [110.7 kg (244 lb 0.8 oz)] 110.7 kg (244 lb 0.8 oz) (06/30 0500),     Intake/Output Summary (Last 24 hours) at 01/10/14 0921 Last data filed at 01/10/14 0700  Gross per 24 hour  Intake    827 ml  Output   1240 ml  Net   -413 ml    Results for orders placed during the hospital encounter of 2014/01/21 (from the past 24 hour(s))  GLUCOSE, CAPILLARY     Status: Abnormal   Collection Time    01/09/14 12:11 PM      Result Value Ref Range   Glucose-Capillary 108 (*) 70 - 99 mg/dL  GLUCOSE, CAPILLARY     Status: Abnormal   Collection Time    01/09/14  3:47 PM      Result Value Ref Range   Glucose-Capillary 133 (*) 70 - 99 mg/dL  GLUCOSE, CAPILLARY     Status: Abnormal   Collection Time    01/09/14  8:10 PM      Result Value Ref Range   Glucose-Capillary 103 (*) 70 - 99 mg/dL  GLUCOSE, CAPILLARY     Status: Abnormal   Collection Time    01/10/14 12:18 AM      Result Value Ref Range   Glucose-Capillary 134 (*) 70 - 99 mg/dL   Comment 1 Notify RN    PROTIME-INR     Status: Abnormal   Collection Time    01/10/14  4:16 AM      Result Value Ref Range   Prothrombin Time 26.0 (*) 11.6 - 15.2 seconds   INR 2.38 (*) 0.00 - 1.49  COMPREHENSIVE METABOLIC PANEL     Status: Abnormal   Collection Time    01/10/14  4:16 AM      Result Value Ref Range   Sodium 143  137 - 147 mEq/L   Potassium 3.6 (*) 3.7 - 5.3 mEq/L   Chloride 102  96 - 112 mEq/L   CO2 24  19 - 32 mEq/L   Glucose, Bld 125 (*) 70 - 99 mg/dL   BUN 64 (*) 6 - 23 mg/dL   Creatinine, Ser 0.25 (*) 0.50 - 1.10 mg/dL   Calcium 8.6  8.4 - 42.7 mg/dL   Total Protein 5.0 (*) 6.0 - 8.3 g/dL    Albumin 2.0 (*) 3.5 - 5.2 g/dL   AST 57 (*) 0 - 37 U/L   ALT 59 (*) 0 - 35 U/L   Alkaline Phosphatase 114  39 - 117 U/L   Total Bilirubin 0.4  0.3 - 1.2 mg/dL   GFR calc non Af Amer 9 (*) >90 mL/min   GFR calc Af Amer 10 (*) >90 mL/min  CBC WITH DIFFERENTIAL     Status: Abnormal   Collection Time    01/10/14  4:16 AM      Result Value Ref Range   WBC 7.6  4.0 - 10.5 K/uL   RBC 2.66 (*) 3.87 - 5.11 MIL/uL   Hemoglobin 8.1 (*) 12.0 - 15.0 g/dL   HCT 06.2 (*) 37.6 - 28.3 %   MCV 94.7  78.0 - 100.0 fL   MCH 30.5  26.0 - 34.0 pg   MCHC 32.1  30.0 - 36.0 g/dL   RDW 16.115.9 (*) 09.611.5 - 04.515.5 %   Platelets 132 (*) 150 - 400 K/uL   Neutrophils Relative % 81 (*) 43 - 77 %   Neutro Abs 6.2  1.7 - 7.7 K/uL   Lymphocytes Relative 10 (*) 12 - 46 %   Lymphs Abs 0.8  0.7 - 4.0 K/uL   Monocytes Relative 6  3 - 12 %   Monocytes Absolute 0.4  0.1 - 1.0 K/uL   Eosinophils Relative 3  0 - 5 %   Eosinophils Absolute 0.2  0.0 - 0.7 K/uL   Basophils Relative 0  0 - 1 %   Basophils Absolute 0.0  0.0 - 0.1 K/uL  GLUCOSE, CAPILLARY     Status: Abnormal   Collection Time    01/10/14  4:35 AM      Result Value Ref Range   Glucose-Capillary 127 (*) 70 - 99 mg/dL   Comment 1 Notify RN    GLUCOSE, CAPILLARY     Status: Abnormal   Collection Time    01/10/14  8:56 AM      Result Value Ref Range   Glucose-Capillary 133 (*) 70 - 99 mg/dL    Unfortunate events of last week reviewed.  Critical Care Medicine believes trach was dislodged into false passage, leading to tension pneumothorax, with subsequent cardiac arrest.  Now with profound anoxic brain injury.  Remains intubated orally.  I do not have anything further to offer, but would be glad to speak with family if needed.   Flo ShanksWOLICKI, KAROL

## 2014-01-10 NOTE — Progress Notes (Signed)
Chaplain responded to spiritual care consult for end of life support. Family said they and pt have active support from faith community, and that patient is "a woman of God." They requested spiritual support through prayer. Chaplain provided emotional support through prayer, empathic listening, and presence. Family aware of chaplain services. Will follow up. Please page if needed.   Maurene Capes [phone]

## 2014-01-10 NOTE — Procedures (Signed)
Extubation Procedure Note  Patient Details:   Name: Sarah Tran DOB: June 03, 1946 MRN: [id number]   Airway Documentation:     Evaluation  O2 sats: stable throughout Complications: No apparent complications Patient did tolerate procedure well. Bilateral Breath Sounds: Rhonchi Suctioning: Airway;Oral No   Pt terminally extubated.     Devra Dopp D 01/10/2014, 3:46 PM

## 2014-01-10 NOTE — Progress Notes (Signed)
Extensive discussion with family all children. We discussed the poor prognosis and likely poor quality of life. Family has decided to offer full comfort care. They are aware that the patient may be transferred to palliative care floor for continued comfort care needs. They have been fully updated on the process and expectations.   Mcarthur Rossetti. Tyson Alias, MD, FACP Pgr: [phone] [facility]

## 2014-01-10 NOTE — Progress Notes (Signed)
Withdrawal of life support at this time.  Extubated to RA.  Monitor on comfort screen.  Morphine gtt infusing.  Some agonal breathing noted initially.  Increased gtt for comfort.  Pts family at bedside and will let nursing know of any concerns of pain or distress.

## 2014-01-11 DEATH — deceased

## 2014-01-12 NOTE — Progress Notes (Signed)
Daughter Called in about death certificate not on site with funeral home. Will call MD.

## 2014-01-13 LAB — NEURON-SPECIFIC ENOLASE(NSE), BLOOD

## 2014-01-23 NOTE — Discharge Summary (Signed)
NAMESHEYANN, ALONGI NO.:  [id number]  MEDICAL RECORD NO.:  [id number]  LOCATION:                                 FACILITY:  PHYSICIAN:  Nelda Bucks, MD DATE OF BIRTH:  07-12-1946  DATE OF ADMISSION:  12/30/2013 DATE OF DISCHARGE:  2014-01-20                              DISCHARGE SUMMARY   DEATH SUMMARY  This is a 68 year old, with a recent prolonged hospitalization including tracheostomy, ultimately decannulated and discontinued  on December 12, 2013, who presented to an outside hospital on December 27, 2013, for respiratory distress, transferred to [facility].  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  On December 30, 2013, patient underwent tracheostomy redo by the ENT on January 06, 2014, she developed cardiac arrest likely secondary to respiratory event resuscitated and brought to the ICU.  So significant events for the patient included on December 28, 2013, with laryngoscopy by ENT with normal vocal cords, tracheomalacia and stenosis noted.  On December 30, 2013, the patient underwent tracheostomy by ENT.  On January 06, 2014, she suffered a cardiac arrest.  There was concerns with the tracheostomy dislodgement and potential of tracheostomy placement into a false lumen giving rise to pneumothorax.  The patient did have bilateral needle thoracotomy done during ACLS and chest tube on the left hand side with air escaping with stabilization of vital signs.  On January 08, 2014, the patient had CT scan of the abdomen pelvis with small left basilar pneumothorax, extensive amount of subcutaneous emphysema overlying the left anterior abdominal wall, small loculus free air in the abdomen may be secondary to tracking from extension.  Extra abdominal gas, acute abdominal process not excluded.  Essentially, the patient did not wake up from significant almost near 1 hour cardiac arrest in spite of aggressive interventions including chest  tube placement on January 05, 2014, she was noted to have a central line, on January 05, 2014, as well.  The patient also had received antibiotics.  Discussion with the family undertaken to note the EEG conducted by Neurology which was abnormal with no signs of brain activity noted.  Patient suffered from severe anoxic brain injury and comfort care was provided and the patient expired.  FINAL DIAGNOSES UPON DEATH: 1. Tracheal stenosis and malacia status post need tracheostomy on December 30, 2013. 2. Possible tension pneumothorax on the left side status post chest     tube placement on January 06, 2014, secondary to possible tracheostomy     dislodgement. 3. Severe anoxic brain injury. 4. Status post cardiac arrest secondary to #2. 5. Acute on chronic renal failure.     Nelda Bucks, MD    DJF/MEDQ  D:  01/22/2014  T:  01/22/2014  Job:  [id number]

## 2014-02-11 NOTE — Progress Notes (Addendum)
[facility] referral [phone]. Suitable for tissue and eyes. Asked to prep eyes with saline. Spoke to [employer], [place], did not want me to prep eyes at this time 1110. Wanted me to wait for daughter to arrive. Family refusing eye prep and autopsy @ 1140

## 2014-02-11 NOTE — Progress Notes (Signed)
Nutrition Brief Note  Chart reviewed. Pt now transitioning to comfort care.  No further nutrition interventions warranted at this time.  Please re-consult as needed.   Jhordan Mckibben MS, RD, LDN Inpatient Registered Dietitian Pager: [phone] After-hours pager: [phone]    

## 2014-02-11 NOTE — Progress Notes (Signed)
RN called to bedside. Noticed agonal breathing and stayed with patient. Noticed no rise and fall of chest for two minutes. No audible breath or heart sounds. 1008am Second RN called to assess and confirm assessment findings and pronounce death. Iv morphine drip stopped and 40cc wasted. Dr. Molli Knock notified and aware of nursing assessment. Charge RN notified daughter, Lupita Leash who plans to come to bedside.

## 2014-02-11 NOTE — Progress Notes (Signed)
Chaplain responded to page from 5W. RN said pt had just passed and family would like chaplain support. Pt's brother and 5-6 other family members were present. Family joined hands around pt and chaplain led them in prayer. This is clearly a family of faith and they were very participatory in the prayer. Provided emotional support for several family members as they awaited pastor's arrival. Visited with pt's brother in pt's room and provided emotional support for pt's daughters in waiting area. Daughters were tearful but grieving appropriately and stated "we are at peace."

## 2014-02-11 DEATH — deceased

## 2015-04-29 IMAGING — CR DG CHEST 1V PORT
1 series · 1 of 1 positions shown · non-contrast
Comparison: 01/03/2014

ADDENDUM:
The original report was by Dr. Klpigbb Moolman. The following
addendum is by Dr. Klpigbb Moolman:

These results were called by telephone at the time of interpretation
on 01/04/2014 at [DATE] to Kt Diesta, who verbally acknowledged
these results.
CLINICAL DATA: Tracheostomy tube placement.
EXAM:
PORTABLE CHEST - 1 VIEW

[AP]
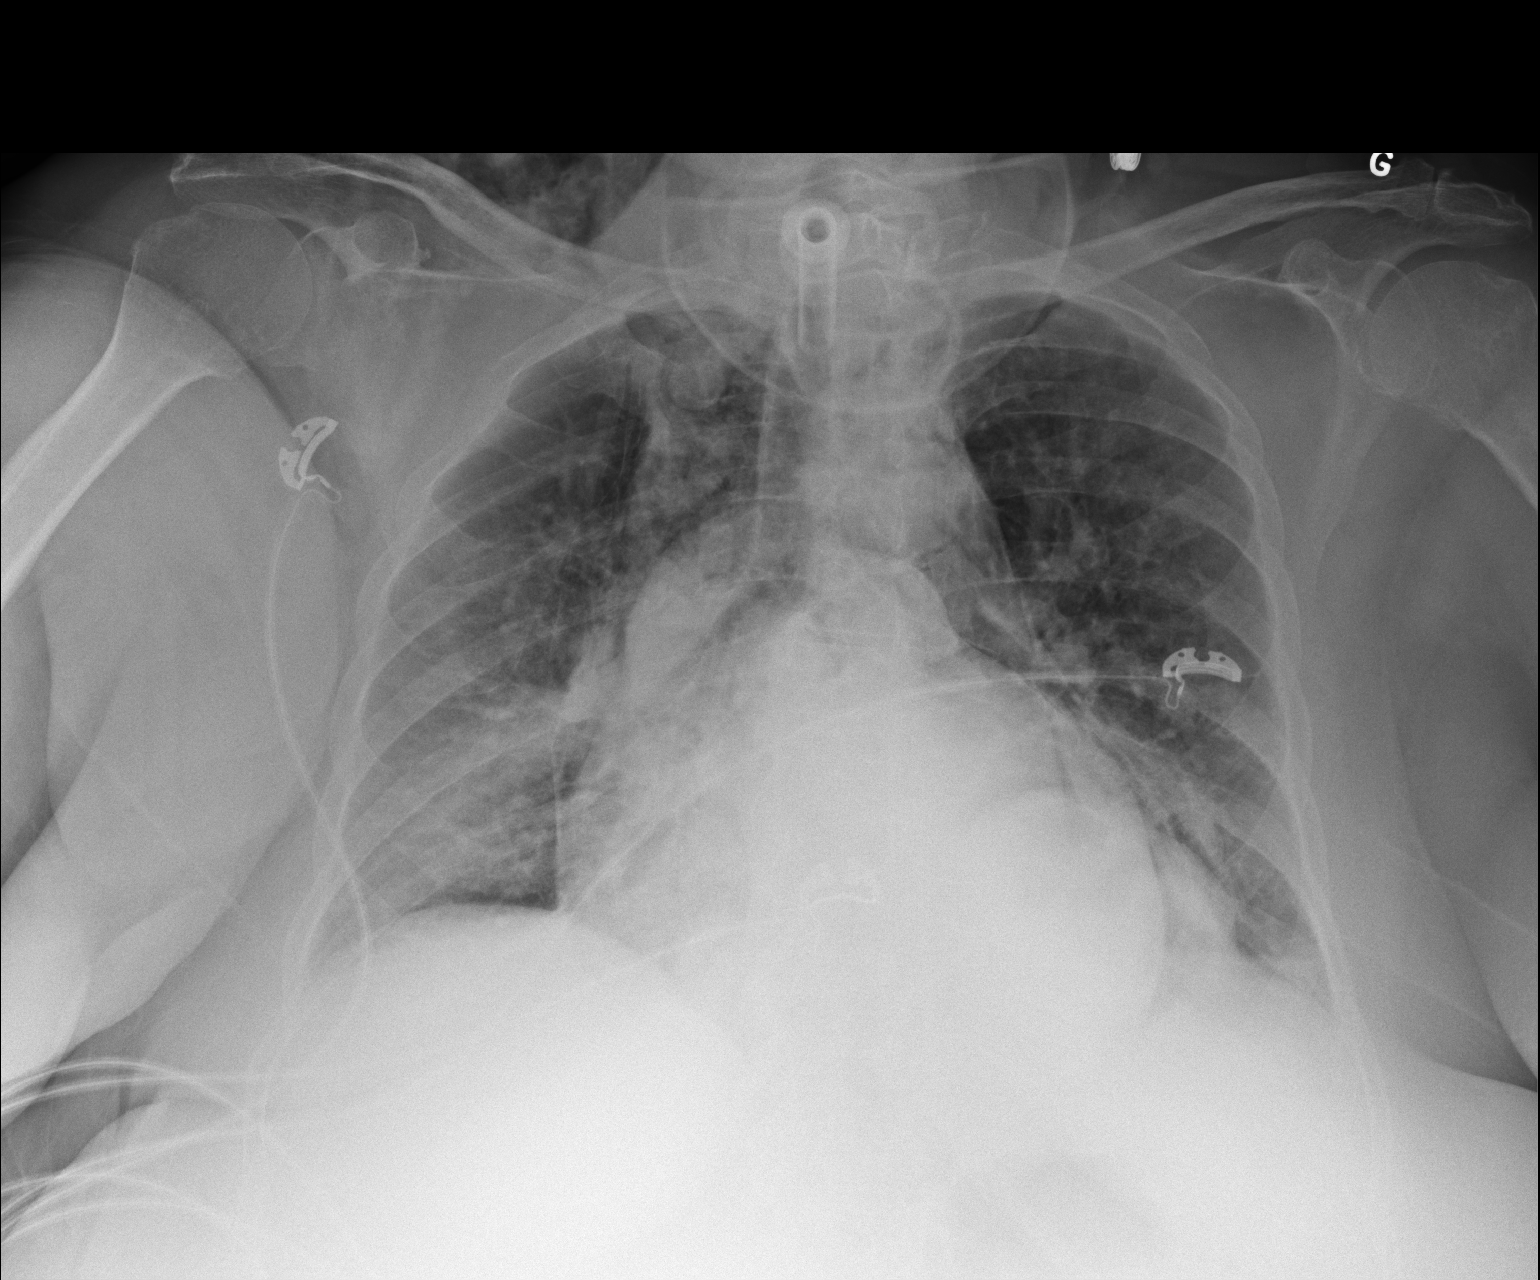

[1 of 1 positions shown; findings below may reference images not displayed]

FINDINGS: The tracheostomy tube projects over the tracheal air column.

There is subcutaneous emphysema along the right neck and adjacent
shoulder. There is also pneumomediastinum which appears new compared
to the prior exam.

A retrocardiac hiatal hernia is present. Mild perihilar airspace
opacities. Mild cardiomegaly.
IMPRESSION: 1. New pneumomediastinum.
2. Mild cardiomegaly, with perihilar densities potentially
reflecting early pulmonary edema or atelectasis.
3. Hiatal hernia.
4. Subcutaneous emphysema in the right neck.

## 2015-05-01 IMAGING — CR DG CHEST 1V PORT
1 series · 1 of 1 positions shown · non-contrast
Comparison: 01/04/2014

CLINICAL DATA: Respiratory distress with tracheostomy, now
intubated.

EXAM:
PORTABLE CHEST - 1 VIEW

[AP]
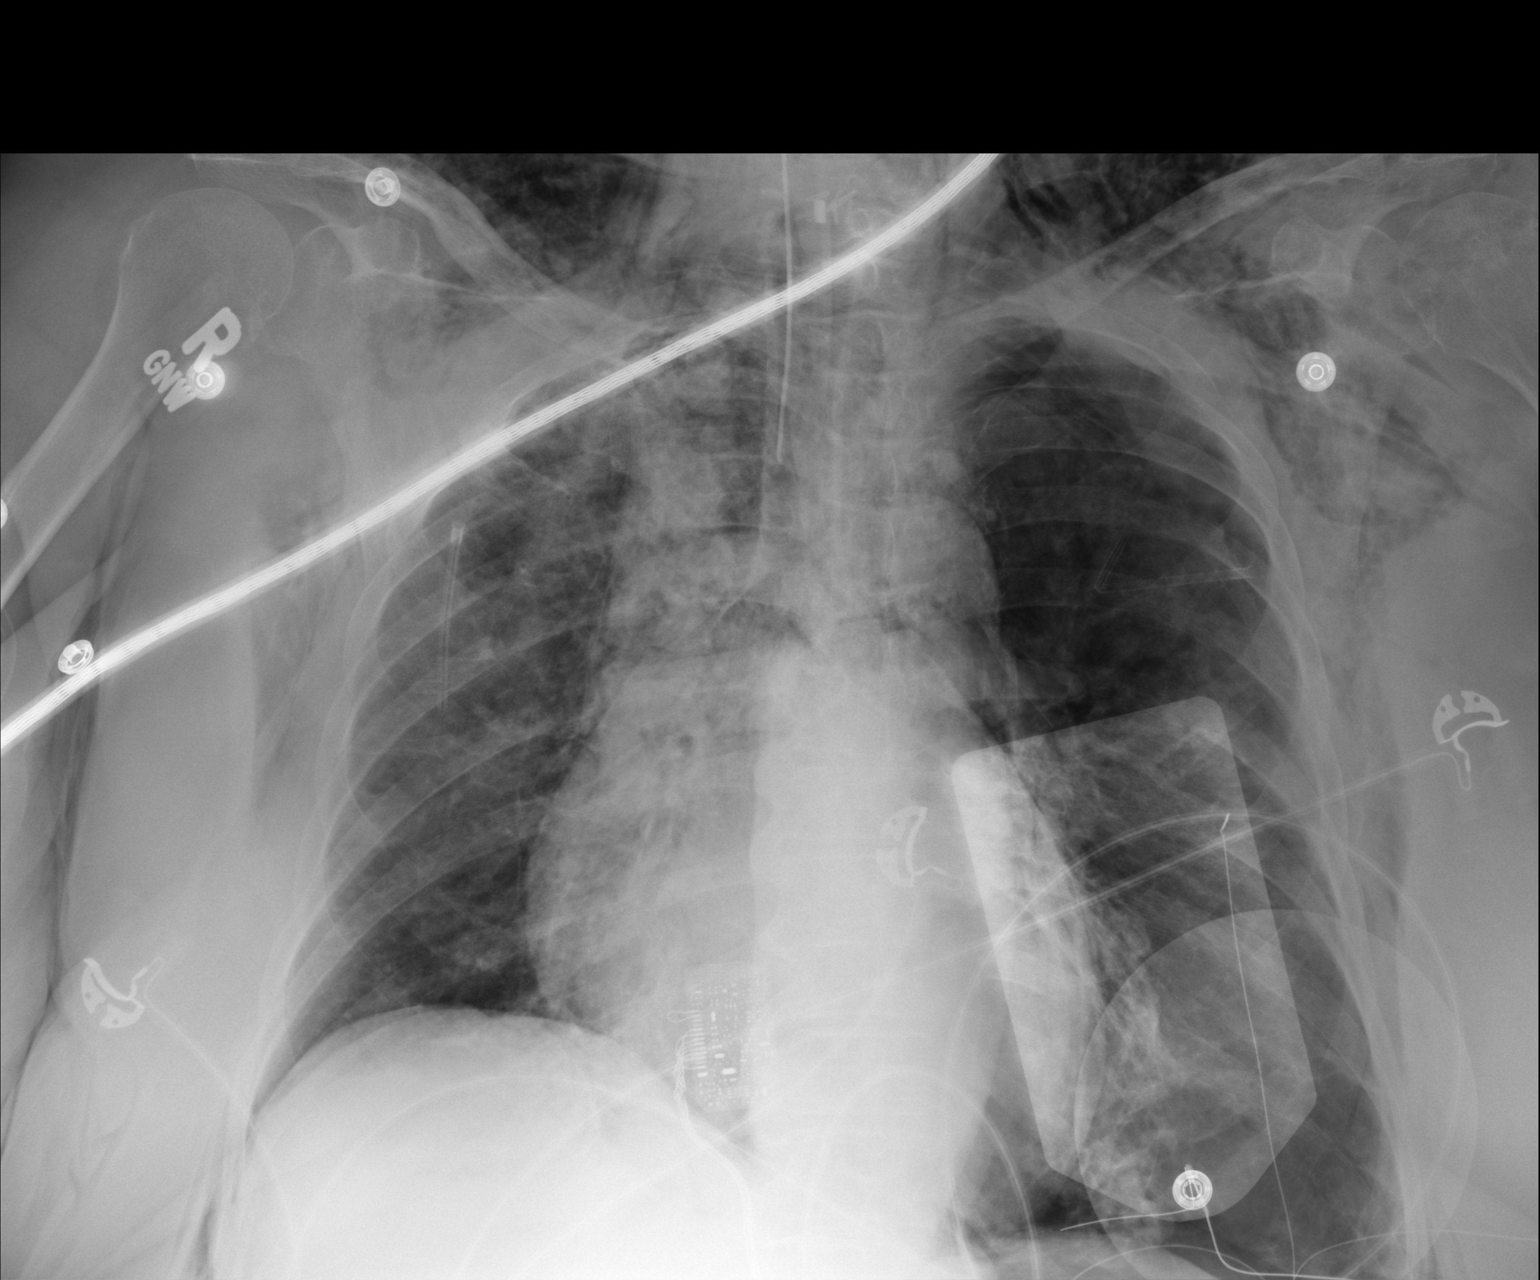

[1 of 1 positions shown; findings below may reference images not displayed]

FINDINGS: Interval removal of tracheostomy tube and placement of an
endotracheal tube. Tip measures 4.5 cm above the carinal. Shallow
inspiration with elevation of the right hemidiaphragm. Heart size
and pulmonary vascularity are normal. There is interval development
of prominent subcutaneous emphysema throughout the neck and upper
chest with probable pneumomediastinum. No definitive evidence of
pneumothorax. However, the subcutaneous emphysema could obscure
small pneumothoraces. Atelectasis in the left lung base similar to
prior study.
IMPRESSION: Endotracheal tube tip measures 4.5 cm above the carina. Extensive
subcutaneous emphysema in the neck and upper chest with probable
pneumomediastinum. No definitive pneumothorax. Atelectasis in the
left lung base.

## 2015-05-01 IMAGING — CR DG CHEST 1V PORT
1 series · 1 of 1 positions shown · non-contrast
Comparison: Portable exam 6067 hr compared to 1411 hr

CLINICAL DATA: Subcutaneous emphysema, chest tube, question
pneumothorax

EXAM:
PORTABLE CHEST - 1 VIEW

[AP]
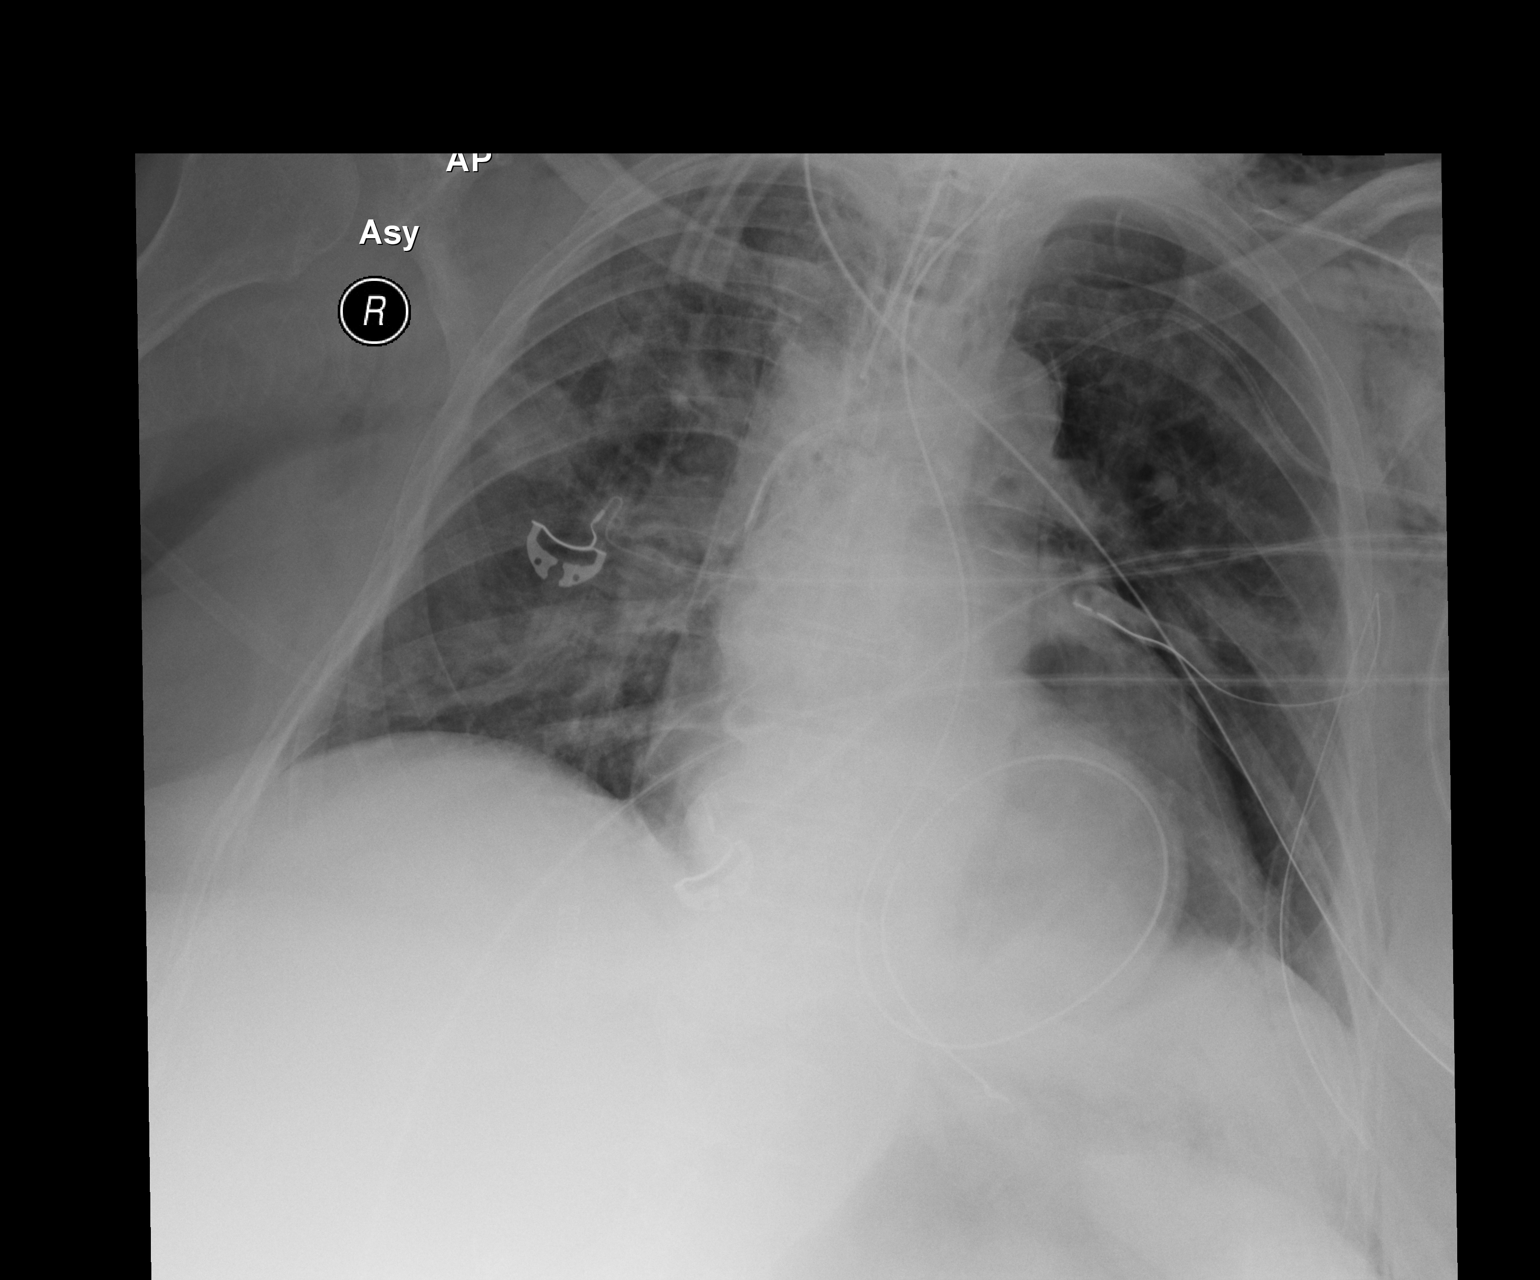

[1 of 1 positions shown; findings below may reference images not displayed]

FINDINGS: Tip of endotracheal tube projects 2.3 cm above carina.

Nasogastric tube coiled in large hiatal hernia before entering
abdomen/stomach.

LEFT subclavian central venous catheter with tip projecting over
SVC.

LEFT thoracostomy tube present.

Stable heart size and pulmonary vascularity.

Pneumomediastinum again identified at cardiac apex and adjacent to
aortic arch.

Scattered atelectasis and infiltrate.

No definite pleural effusion or pneumothorax.

Chest wall emphysema on LEFT with subcutaneous emphysema in the
cervical regions bilaterally.
IMPRESSION: Persistent visualization of pneumomediastinum and chest
wall/cervical emphysema.

LEFT thoracostomy tube without definite pneumothorax identified.

Scattered pulmonary infiltrates and atelectasis little changed.

## 2015-05-02 IMAGING — CR DG CHEST 1V PORT
1 series · 1 of 1 positions shown · non-contrast
Comparison: Chest x-ray 01/06/2014.

CLINICAL DATA: Evaluate endotracheal tube placement.

EXAM:
PORTABLE CHEST - 1 VIEW

[AP]
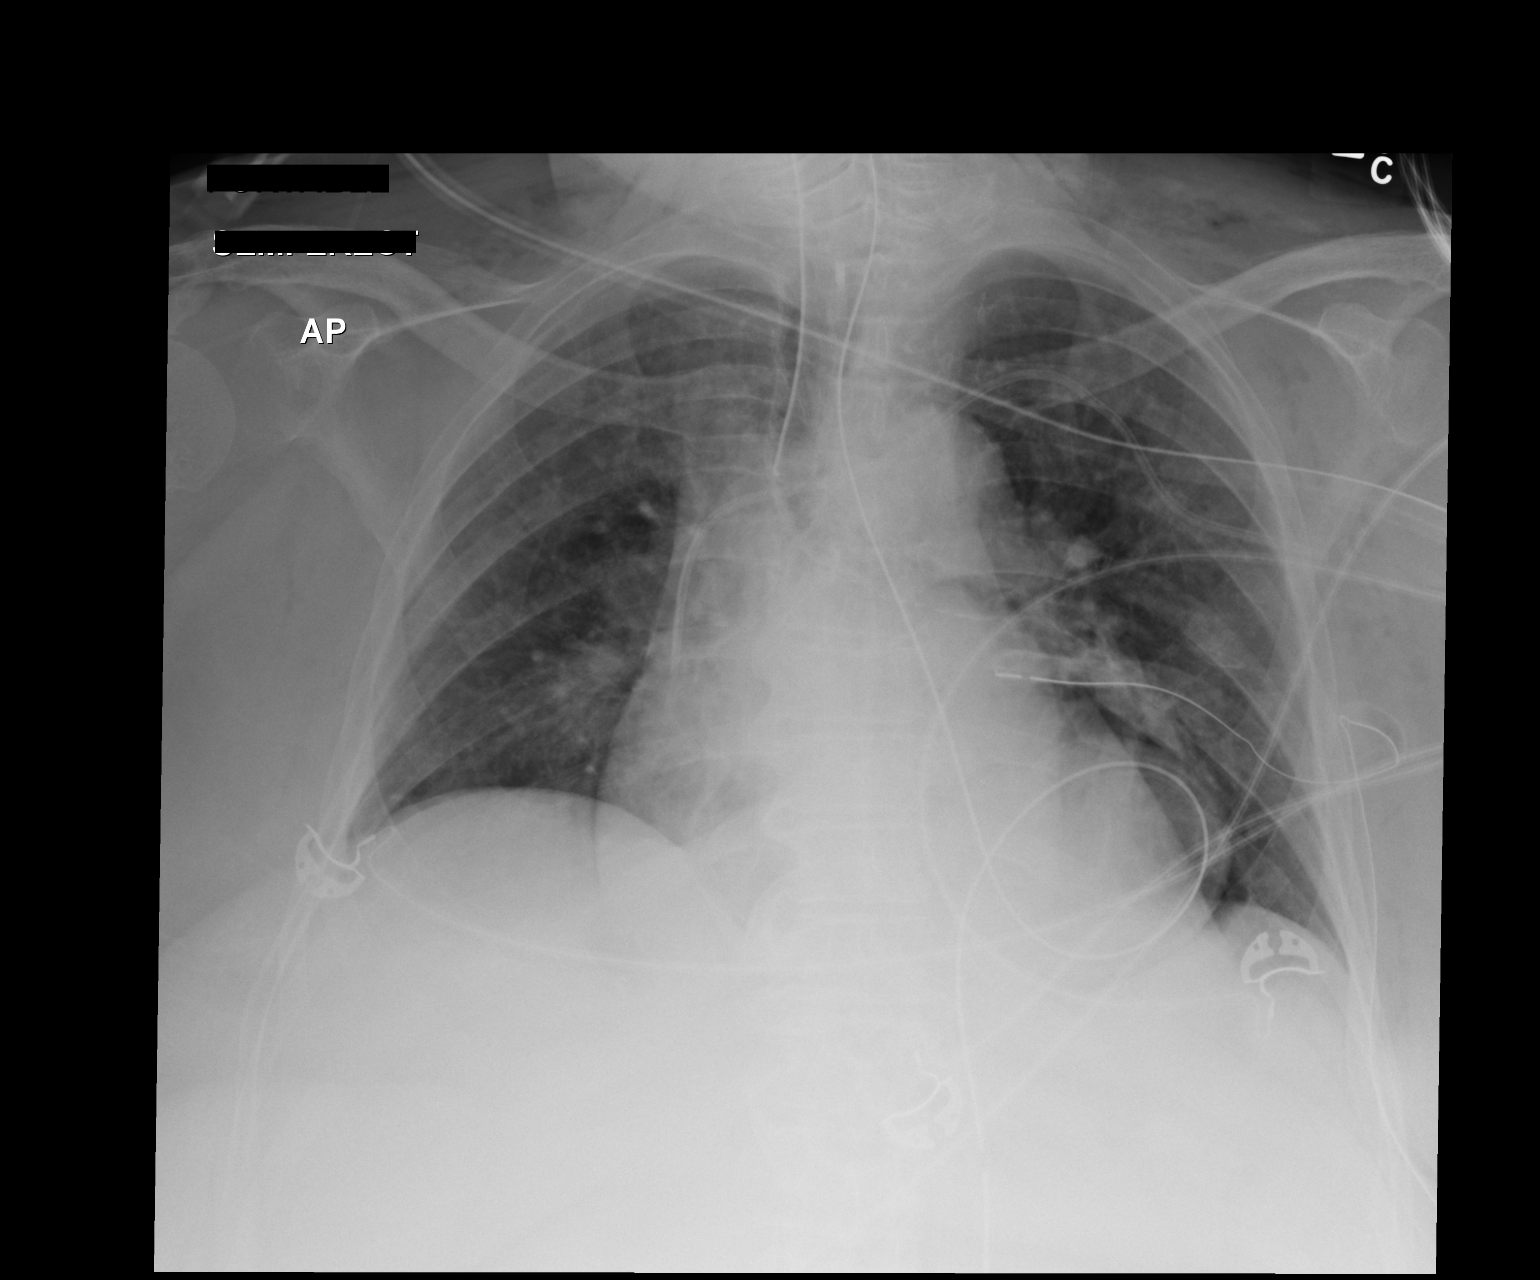

[1 of 1 positions shown; findings below may reference images not displayed]

FINDINGS: An endotracheal tube is in place with tip 2.1 cm above the carina.
There is a left-sided subclavian central venous catheter with tip
terminating in the distal superior vena cava. Nasogastric tube is
coiled in the patient's large hiatal hernia, but ultimately extends
into the stomach. Left-sided chest tube in position with tip in the
medial aspect of the left hemithorax. No pneumothorax. Previously
noted pneumomediastinum is slightly less evident. There is a small
amount of subcutaneous emphysema in the left chest wall tracking in
the left cervical region. Lung volumes are low. Patchy areas of
interstitial prominence an ill-defined airspace disease are noted
throughout the left lung, similar to the recent prior study. Right
lung appears clear. No pleural effusions. No evidence of pulmonary
edema. Mild cardiomegaly. Upper mediastinal contours are distorted
by patient's rotation to the right.
IMPRESSION: 1. Support apparatus, as above.
2. Persistent but decreasing pneumomediastinum.
3. Patchy interstitial and airspace opacities throughout the left
lung appear unchanged.
4. Mild cardiomegaly.

## 2015-05-03 IMAGING — CR DG CHEST 1V PORT
1 series · 1 of 1 positions shown · non-contrast
Comparison: 1 day prior

CLINICAL DATA: Evaluate endotracheal tube. Atrial fibrillation.
Morbid obesity. Hypertension.

EXAM:
PORTABLE CHEST - 1 VIEW

[AP]
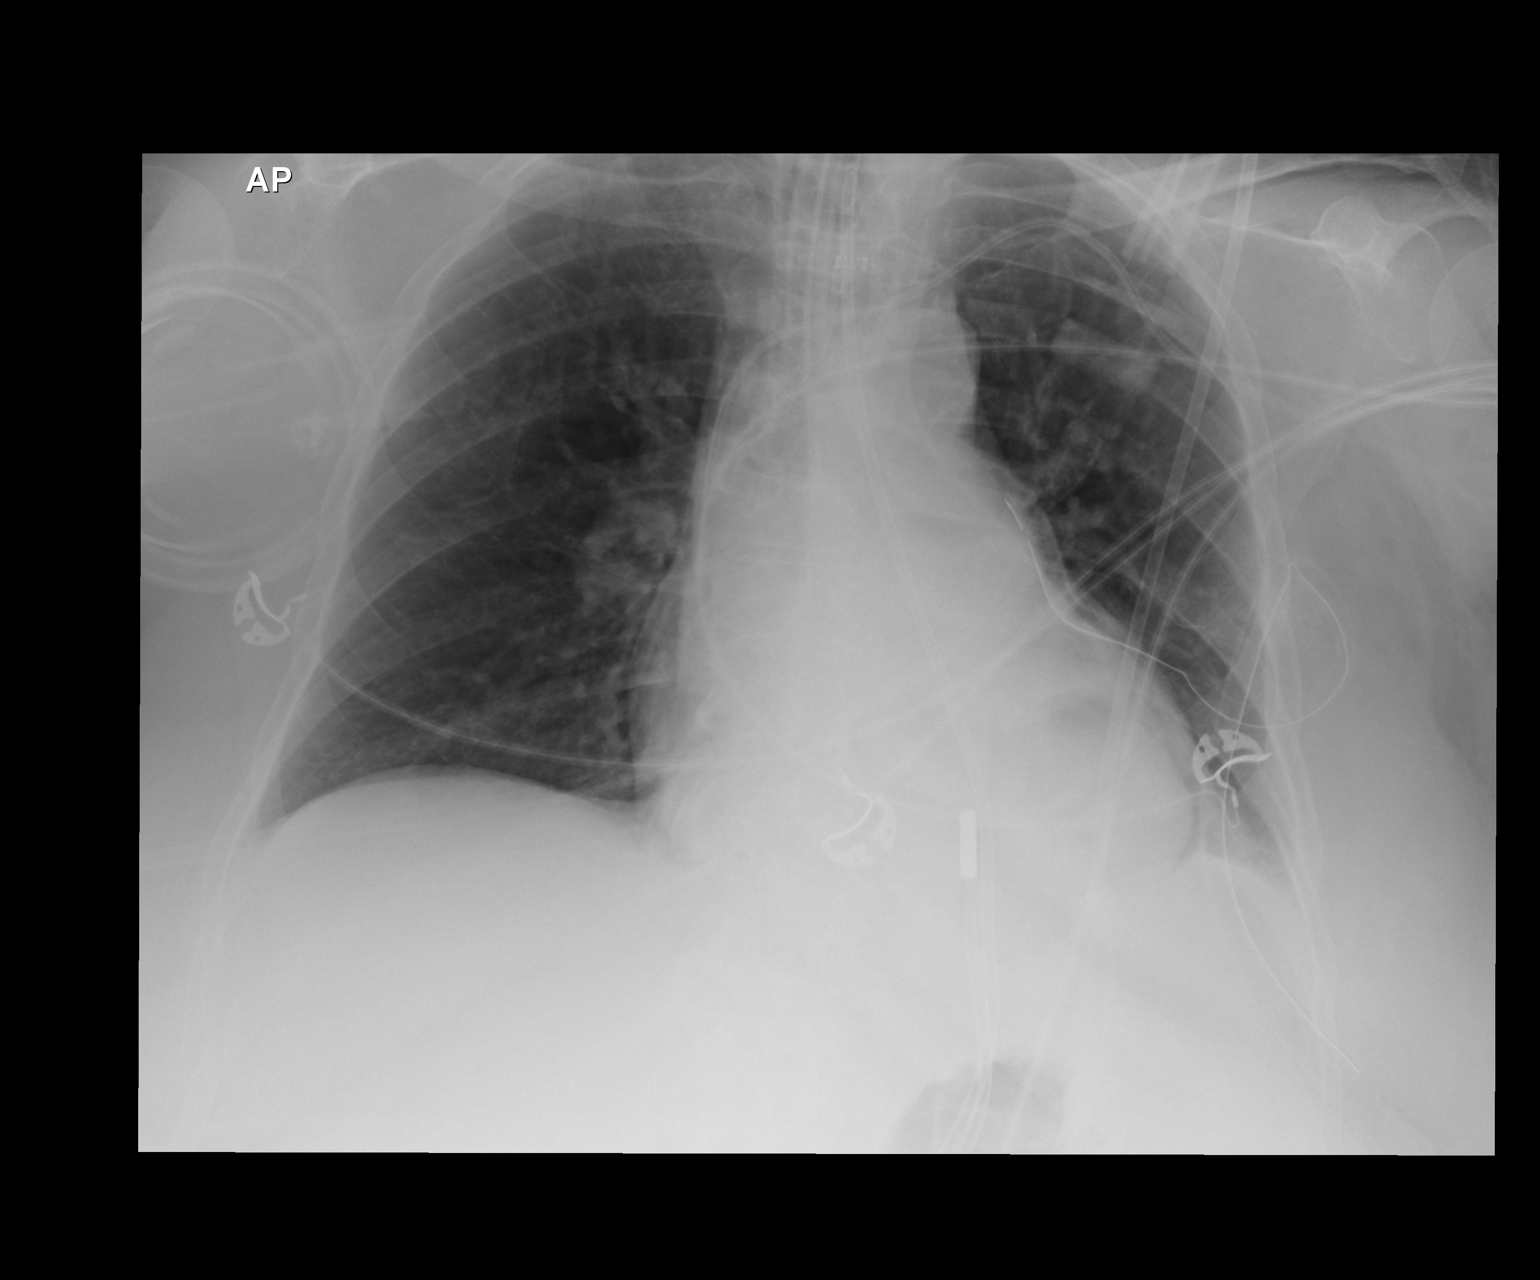

[1 of 1 positions shown; findings below may reference images not displayed]

FINDINGS: Endotracheal tube terminates 2.7 cm above carina. Feeding tube is
looped in the stomach with its tip directed superiorly . This is
new. The nasogastric tube has been removed. Left subclavian line
terminates likely at the low SVC. Suboptimally visualized.

Mild cardiomegaly. Nearly completely resolved subcutaneous
emphysema. No pleural fluid. Left-sided chest tube remains in place.
Medial left pneumothorax is suspected. No apical component.
IMPRESSION: Interval replacement of nasogastric tube with a feeding tube. This
is looped in the stomach with its tip directed superiorly. This
likely terminates in a hiatal hernia.

Left chest tube in place. Nearly completely resolved subcutaneous
emphysema. Cannot exclude small volume medial left pleural air.

Cardiomegaly without congestive failure.
